# Patient Record
Sex: Female | Born: 1965
Health system: Southern US, Community
[De-identification: ages and names within clinical notes are randomized; demographics above are authoritative.]

## PROBLEM LIST (undated history)

## (undated) DIAGNOSIS — Z973 Presence of spectacles and contact lenses: Secondary | ICD-10-CM

## (undated) DIAGNOSIS — D500 Iron deficiency anemia secondary to blood loss (chronic): Secondary | ICD-10-CM

## (undated) DIAGNOSIS — F419 Anxiety disorder, unspecified: Secondary | ICD-10-CM

## (undated) DIAGNOSIS — N920 Excessive and frequent menstruation with regular cycle: Secondary | ICD-10-CM

## (undated) DIAGNOSIS — K635 Polyp of colon: Secondary | ICD-10-CM

## (undated) DIAGNOSIS — J302 Other seasonal allergic rhinitis: Secondary | ICD-10-CM

## (undated) HISTORY — PX: COLONOSCOPY: SHX174

## (undated) HISTORY — DX: Iron deficiency anemia secondary to blood loss (chronic): D50.0

## (undated) HISTORY — DX: Polyp of colon: K63.5

## (undated) HISTORY — DX: Excessive and frequent menstruation with regular cycle: N92.0

---

## 2001-06-16 ENCOUNTER — Other Ambulatory Visit: Admission: RE | Admit: 2001-06-16 | Discharge: 2001-06-16 | Payer: Self-pay | Admitting: Obstetrics and Gynecology

## 2001-06-22 ENCOUNTER — Encounter: Admission: RE | Admit: 2001-06-22 | Discharge: 2001-06-22 | Payer: Self-pay | Admitting: Obstetrics and Gynecology

## 2001-06-22 ENCOUNTER — Encounter: Payer: Self-pay | Admitting: Obstetrics and Gynecology

## 2005-01-20 ENCOUNTER — Emergency Department (HOSPITAL_COMMUNITY): Admission: EM | Admit: 2005-01-20 | Discharge: 2005-01-20 | Payer: Self-pay | Admitting: Emergency Medicine

## 2006-01-07 ENCOUNTER — Ambulatory Visit: Payer: Self-pay | Admitting: Internal Medicine

## 2007-07-14 HISTORY — PX: BREAST BIOPSY: SHX20

## 2008-05-30 ENCOUNTER — Ambulatory Visit: Payer: Self-pay | Admitting: Family Medicine

## 2008-05-30 DIAGNOSIS — J019 Acute sinusitis, unspecified: Secondary | ICD-10-CM | POA: Insufficient documentation

## 2008-05-30 DIAGNOSIS — F329 Major depressive disorder, single episode, unspecified: Secondary | ICD-10-CM | POA: Insufficient documentation

## 2010-11-12 ENCOUNTER — Other Ambulatory Visit: Payer: Self-pay | Admitting: Obstetrics and Gynecology

## 2011-11-11 LAB — HM PAP SMEAR

## 2012-05-31 ENCOUNTER — Encounter: Payer: Self-pay | Admitting: Family Medicine

## 2012-05-31 ENCOUNTER — Ambulatory Visit (INDEPENDENT_AMBULATORY_CARE_PROVIDER_SITE_OTHER): Payer: BC Managed Care – PPO | Admitting: Family Medicine

## 2012-05-31 VITALS — BP 102/70 | HR 96 | Temp 98.4°F | Ht 66.0 in | Wt 140.0 lb

## 2012-05-31 DIAGNOSIS — F32A Depression, unspecified: Secondary | ICD-10-CM

## 2012-05-31 DIAGNOSIS — F329 Major depressive disorder, single episode, unspecified: Secondary | ICD-10-CM

## 2012-05-31 DIAGNOSIS — Z23 Encounter for immunization: Secondary | ICD-10-CM

## 2012-05-31 DIAGNOSIS — J309 Allergic rhinitis, unspecified: Secondary | ICD-10-CM

## 2012-05-31 DIAGNOSIS — Z7189 Other specified counseling: Secondary | ICD-10-CM

## 2012-05-31 DIAGNOSIS — Z7689 Persons encountering health services in other specified circumstances: Secondary | ICD-10-CM

## 2012-05-31 DIAGNOSIS — T148XXA Other injury of unspecified body region, initial encounter: Secondary | ICD-10-CM

## 2012-05-31 MED ORDER — FLUTICASONE PROPIONATE 50 MCG/ACT NA SUSP: 2.0000 | Freq: Every day | NASAL | Status: DC

## 2012-05-31 NOTE — Patient Instructions (Addendum)
-  We have ordered labs or studies at this visit. It can take up to 1-2 weeks for results and processing. We will contact you with instructions IF your results are abnormal. Normal results will be released to your [facility]. If you have not heard from [name] or can not find your results in [facility] in 2 weeks please contact our office.  -PLEASE SIGN UP FOR MYCHART TODAY   We recommend the following healthy lifestyle measures: - eat a healthy diet consisting of lots of vegetables, fruits, beans, nuts, seeds, healthy meats such as white chicken and fish and whole grains.  - avoid fried foods, fast food, processed foods, sodas, red meet and other fattening foods.  - get a least 150 minutes of aerobic exercise per week.   DEPRESSION: Can try to taper off of Celexa over 3- 4 weeks very slowly. Let me know if this does not go well and you need to restart medication.  FOR ALLERGIES: -try flonase 2 sprays for a few weeks then 1 spray each nostril -can also try over the counter zyrtec or other once daily  HEMATOMA: -iced 15 minutes twice daily -follow up if not resolving over next several weeks  Follow up in:  -3 months

## 2012-05-31 NOTE — Progress Notes (Signed)
Chief Complaint  Patient presents with  . Establish Care    HPI: Sheryl Gonzalez is here to establish care. Works in Programme researcher, broadcasting/film/video for [employer]. Plays softball in some seasons, does not get much exercise in winter. Last visit here in 2009. She has been relatively healthy.   Has the following chronic problems and concerns today:  Anxiety and Depression: -has been on celexa for several years -taking 20 mg daily -stable in terms of mood and doing well  L leg swelling: -got hit in shin with soft ball 3 weeks ago -bruised and has swelling there -weightbearing is fine   Other Providers: -Dr. Donovan Kail, last physical May 2013, mammogram, lab work (cholesterol was good) -[facility]: for vision -Dr. Domingo Dimes: dentist  Vaccines: refuses flu vaccine, can't remember last tdap - she wants to get this today.  ROS: See pertinent positives and negatives per HPI.  No past medical history on file.  Family History  Problem Relation Age of Onset  . Arthritis Other   . Uterine cancer      parent  . Kidney disease Other   . Diabetes Other     History   Social History  . Marital Status: Married    Spouse Name: N/A    Number of Children: N/A  . Years of Education: N/A   Social History Main Topics  . Smoking status: Never Smoker   . Smokeless tobacco: None  . Alcohol Use: No  . Drug Use: None  . Sexually Active: None   Other Topics Concern  . None   Social History Narrative  . None    Current outpatient prescriptions:Cholecalciferol (VITAMIN D-3) 1000 UNITS CAPS, Take by mouth daily., Disp: , Rfl: ;  citalopram (CELEXA) 40 MG tablet, Take 20 mg by mouth daily. , Disp: , Rfl: ;  cyanocobalamin 500 MCG tablet, Take 500 mcg by mouth daily., Disp: , Rfl: ;  Multiple Vitamins-Minerals (ADULT GUMMY) CHEW, Chew 2 tablets by mouth daily., Disp: , Rfl:  Multiple Vitamins-Minerals (QC WOMENS DAILY MULTIVITAMIN PO), Take by mouth daily., Disp: , Rfl: ;  OVER THE COUNTER  MEDICATION, Hair, Skin and Nails, Disp: , Rfl: ;  pyridoxine (B-6) 200 MG tablet, Take 200 mg by mouth daily., Disp: , Rfl: ;  vitamin E 400 UNIT capsule, Take 400 Units by mouth daily., Disp: , Rfl: ;  fluticasone (FLONASE) 50 MCG/ACT nasal spray, Place 2 sprays into the nose daily., Disp: 16 g, Rfl: 2  EXAM:  Filed Vitals:   05/31/12 0925  BP: 102/70  Pulse: 96  Temp: 98.4 F (36.9 C)    Body mass index is 22.60 kg/(m^2).  GENERAL: vitals reviewed and listed above, alert, oriented, appears well hydrated and in no acute distress  HEENT: atraumatic, conjunttiva clear, no obvious abnormalities on inspection of external nose and ears. Pale boggy nasal turbinates, PND  NECK: no obvious masses on inspection  LUNGS: clear to auscultation bilaterally, no wheezes, rales or rhonchi, good air movement  CV: HRRR, no peripheral edema  MS: moves all extremities without noticeable abnormality, small soft tissue swelling over L ant shin, no bony TTP  PSYCH: pleasant and cooperative, no obvious depression or anxiety  ASSESSMENT AND PLAN:  Discussed the following assessment and plan:  1. Encounter to establish care    2. Depression    3. Allergic rhinitis  fluticasone (FLONASE) 50 MCG/ACT nasal spray  4. Hematoma     -sounds like pt had fasting labs earlier this year and  fine per pt, so will not repeat today -advised trial of weaning off celexa   -We reviewed the PMH, PSH, FH, SH, Meds and Allergies. -We provided refills for any medications we will prescribe as needed. -We addressed current concerns per orders and patient instructions. -We have asked for records for pertinent exams, studies, vaccines and notes from previous providers. -We have advised patient to follow up per instructions below. -Influenza vaccine given today  -Patient advised to return or notify a doctor immediately if symptoms worsen or persist or new concerns arise.  Patient Instructions  -We have ordered labs  or studies at this visit. It can take up to 1-2 weeks for results and processing. We will contact you with instructions IF your results are abnormal. Normal results will be released to your [facility]. If you have not heard from [name] or can not find your results in [facility] in 2 weeks please contact our office.  -PLEASE SIGN UP FOR MYCHART TODAY   We recommend the following healthy lifestyle measures: - eat a healthy diet consisting of lots of vegetables, fruits, beans, nuts, seeds, healthy meats such as white chicken and fish and whole grains.  - avoid fried foods, fast food, processed foods, sodas, red meet and other fattening foods.  - get a least 150 minutes of aerobic exercise per week.   DEPRESSION: Can try to taper off of Celexa over 3- 4 weeks very slowly. Let me know if this does not go well and you need to restart medication.  FOR ALLERGIES: -try flonase 2 sprays for a few weeks then 1 spray each nostril -can also try over the counter zyrtec or other once daily  HEMATOMA: -iced 15 minutes twice daily -follow up if not resolving over next several weeks  Follow up in:  -3 months      KIM, HANNAH R.

## 2012-05-31 NOTE — Addendum Note (Signed)
Addended by: Azucena Freed on: 05/31/2012 10:18 AM   Modules accepted: Orders

## 2012-06-02 ENCOUNTER — Telehealth: Payer: Self-pay | Admitting: Family Medicine

## 2012-06-02 NOTE — Telephone Encounter (Signed)
Pt would like Dr Selena Batten to write her scripts for her vitamins:   Vitamin E, B-12, Vitamin D. Multi Vitamin w/ Minerals. So that she can use her flex plan money to buy them with. Please fax her scripts to:  [phone]. / Attn: Kimberely

## 2012-06-02 NOTE — Telephone Encounter (Signed)
Pt called to check on status of getting scripts written for vitamins. Pls call.

## 2012-06-02 NOTE — Telephone Encounter (Signed)
Please let her know: -Rx written for vitamin D and calcium per recs for her age - she can pick this up if she wishes -folic acid is recommended in women of childbearing age in case they become pregnant -I am sorry, but studies do not support use of other vitamins unless specific deficiencies or illness and they may even increase mortality - for these reasons I can not recommend or prescribe these  Thanks.

## 2012-06-02 NOTE — Telephone Encounter (Signed)
Called and left a message on pt's personal vm that message was received and dr. Selena Batten is currently seeing patients. Per Dr. Selena Batten advised that pt may need an office visit to have these rx filled out.

## 2012-06-02 NOTE — Telephone Encounter (Signed)
pls advise

## 2012-06-03 NOTE — Telephone Encounter (Signed)
Called and spoke with pt and pt is aware of Dr. Elmyra Ricks recommendations.  Pt request to have script faxed to her at work.  Verified fax number.  Rx faxed.

## 2012-07-25 ENCOUNTER — Other Ambulatory Visit: Payer: Self-pay | Admitting: Obstetrics and Gynecology

## 2012-07-25 DIAGNOSIS — N6452 Nipple discharge: Secondary | ICD-10-CM

## 2012-08-03 ENCOUNTER — Other Ambulatory Visit: Payer: Self-pay | Admitting: Obstetrics and Gynecology

## 2012-08-03 DIAGNOSIS — N6452 Nipple discharge: Secondary | ICD-10-CM

## 2012-08-04 ENCOUNTER — Ambulatory Visit
Admission: RE | Admit: 2012-08-04 | Discharge: 2012-08-04 | Disposition: A | Discharge status: Home / Self Care | Payer: BC Managed Care – PPO | Source: Ambulatory Visit | Attending: Obstetrics and Gynecology | Admitting: Obstetrics and Gynecology

## 2012-08-04 ENCOUNTER — Other Ambulatory Visit: Payer: Self-pay | Admitting: Obstetrics and Gynecology

## 2012-08-04 DIAGNOSIS — N6452 Nipple discharge: Secondary | ICD-10-CM

## 2012-08-22 ENCOUNTER — Encounter (INDEPENDENT_AMBULATORY_CARE_PROVIDER_SITE_OTHER): Payer: Self-pay | Admitting: General Surgery

## 2012-08-22 ENCOUNTER — Ambulatory Visit (INDEPENDENT_AMBULATORY_CARE_PROVIDER_SITE_OTHER): Payer: BC Managed Care – PPO | Admitting: General Surgery

## 2012-08-22 ENCOUNTER — Other Ambulatory Visit (INDEPENDENT_AMBULATORY_CARE_PROVIDER_SITE_OTHER): Payer: Self-pay

## 2012-08-22 VITALS — BP 110/62 | HR 73 | Temp 98.2°F | Resp 18 | Ht 65.5 in | Wt 142.6 lb

## 2012-08-22 DIAGNOSIS — N6459 Other signs and symptoms in breast: Secondary | ICD-10-CM

## 2012-08-22 DIAGNOSIS — N6452 Nipple discharge: Secondary | ICD-10-CM | POA: Insufficient documentation

## 2012-08-22 NOTE — Progress Notes (Signed)
Chief Complaint  Patient presents with  . New Evaluation    eval L br palpalloma    HISTORY: Pt seen in consultation at the request of Dr. Tenny Gonzalez of OB/GYN for bloody nipple discharge.  She under went mammogram, ultrasound, and ductogram which defined 2 areas of concern.  She had bloody nipple discharge back in 2010 as well and underwent core needle biopsy with at [facility] with papilloma found.  The discharge resolved for the most part until recently.  On the current ductogram, the duct that was cannulated led to the prior biopsy site, but did not define the defect well.  There was contrast extravasation.  She had a second area of debris/intraductal mass found at 9 o'clock.  She denies breast pain or skin contour changes.  She had a normal mammogram last summer.  These changes are seen on ultrasound/ductgram only.  She denies nipple retraction.  She has not had daily discharge.    History reviewed. No pertinent past medical history.  Past Surgical History  Procedure Laterality Date  . Breast biopsy  2009    Current Outpatient Prescriptions  Medication Sig Dispense Refill  . Cholecalciferol (VITAMIN D-3) 1000 UNITS CAPS Take by mouth daily.      . citalopram (CELEXA) 10 MG tablet Take 10 mg by mouth at bedtime.      . cyanocobalamin 500 MCG tablet Take 500 mcg by mouth daily.      . fluticasone (FLONASE) 50 MCG/ACT nasal spray Place 2 sprays into the nose daily.  16 g  2  . Multiple Vitamins-Minerals (ADULT GUMMY) CHEW Chew 2 tablets by mouth daily.      [name] OVER THE COUNTER MEDICATION Hair, Skin and Nails      . vitamin E 400 UNIT capsule Take 400 Units by mouth daily.      [name] pyridoxine (B-6) 200 MG tablet Take 200 mg by mouth daily.       No current facility-administered medications for this visit.     No Known Allergies   Family History  Problem Relation Age of Onset  . Arthritis Other   . Uterine cancer      parent  . Kidney disease Other   . Diabetes Other   . Cancer Mother    ? Cervical/Ovarian     History   Social History  . Marital Status: Single    Spouse Name: N/A    Number of Children: N/A  . Years of Education: N/A   Social History Main Topics  . Smoking status: Never Smoker   . Smokeless tobacco: Never Used  . Alcohol Use: No  . Drug Use: No  . Sexually Active: None    REVIEW OF SYSTEMS - PERTINENT POSITIVES ONLY: 12 point review of systems negative other than HPI and PMH  EXAM: Filed Vitals:   08/22/12 1044  BP: 110/62  Pulse: 73  Temp: 98.2 F (36.8 C)  Resp: 18    Gen:  No acute distress.  Well nourished and well groomed.   Neurological: Alert and oriented to person, place, and time. Coordination normal.  Head: Normocephalic and atraumatic.  Eyes: Conjunctivae are normal. Pupils are equal, round, and reactive to light. No scleral icterus.  Neck: Normal range of motion. Neck supple. No tracheal deviation or thyromegaly present.  Cardiovascular: Normal rate, regular rhythm, normal heart sounds and intact distal pulses.  Exam reveals no gallop and no friction rub.  No murmur heard. Respiratory: Effort normal.  No respiratory distress. No chest  wall tenderness. Breath sounds normal.  No wheezes, rales or rhonchi.  GI: Soft. Bowel sounds are normal. The abdomen is soft and nontender.  There is no rebound and no guarding.  Musculoskeletal: Normal range of motion. Extremities are nontender.  Lymphadenopathy: No cervical, preauricular, postauricular or axillary adenopathy is present Skin: Skin is warm and dry. No rash noted. No diaphoresis. No erythema. No pallor. No clubbing, cyanosis, or edema.   Psychiatric: Normal mood and affect. Behavior is normal. Judgment and thought content normal.    LABORATORY RESULTS: Available labs are reviewed    RADIOLOGY RESULTS: See E-Chart or I-Site for most recent results.  Images and reports are reviewed.    ASSESSMENT AND PLAN: Nipple discharge, bloody Patient has bloody nipple discharge.  MRI has been recommended because of multiple issues seen on her ductogram, ultrasound, and mammogram.  This was discussed with Dr. Chilton Gonzalez at the breast center. If the MRI is concerning at the 9:00 location, she may be able to get an MR guided biopsy of the area. She also may need the clip needle localized at the 3:00 location. Once the MRI has been performed, we will define these issues better and schedule surgery. At the very least, she needs a major duct excision of the left breast because of the nipple discharge and concern for papilloma versus malignancy.  Risks of surgery were discussed including bleeding and infection.  The patient was advised that this is an outpatient operation.  Discussed post op care and restrictions.       Maudry Diego MD Surgical Oncology, General and Endocrine Surgery [facility]      Visit Diagnoses: 1. Nipple discharge, bloody     Primary Care Physician: Sheryl Gonzalez., DO

## 2012-08-22 NOTE — Assessment & Plan Note (Signed)
Patient has bloody nipple discharge. MRI has been recommended because of multiple issues seen on her ductogram, ultrasound, and mammogram.  This was discussed with Dr. Chilton Si at the breast center. If the MRI is concerning at the 9:00 location, she may be able to get an MR guided biopsy of the area. She also may need the clip needle localized at the 3:00 location. Once the MRI has been performed, we will define these issues better and schedule surgery. At the very least, she needs a major duct excision of the left breast because of the nipple discharge and concern for papilloma versus malignancy.  Risks of surgery were discussed including bleeding and infection.  The patient was advised that this is an outpatient operation.  Discussed post op care and restrictions.

## 2012-08-22 NOTE — Patient Instructions (Signed)
Will get MRI scheduled.  Will determine final surgical plan after MRI and +/- needle biopsy.

## 2012-08-27 ENCOUNTER — Other Ambulatory Visit: Payer: Self-pay

## 2012-08-31 ENCOUNTER — Ambulatory Visit: Payer: BC Managed Care – PPO | Admitting: Family Medicine

## 2012-09-01 ENCOUNTER — Ambulatory Visit
Admission: RE | Admit: 2012-09-01 | Discharge: 2012-09-01 | Disposition: A | Discharge status: Home / Self Care | Payer: BC Managed Care – PPO | Source: Ambulatory Visit | Attending: General Surgery | Admitting: General Surgery

## 2012-09-01 DIAGNOSIS — N6452 Nipple discharge: Secondary | ICD-10-CM

## 2012-09-21 ENCOUNTER — Telehealth (INDEPENDENT_AMBULATORY_CARE_PROVIDER_SITE_OTHER): Payer: Self-pay | Admitting: General Surgery

## 2012-09-21 NOTE — Telephone Encounter (Signed)
Pt called and stated she has an MRI set up for Monday and wanted to check on the status of the Prior Authorization for it.  Please advise.

## 2012-09-26 ENCOUNTER — Ambulatory Visit
Admission: RE | Admit: 2012-09-26 | Discharge: 2012-09-26 | Disposition: A | Discharge status: Home / Self Care | Payer: BC Managed Care – PPO | Source: Ambulatory Visit | Attending: General Surgery | Admitting: General Surgery

## 2012-09-26 DIAGNOSIS — N6452 Nipple discharge: Secondary | ICD-10-CM

## 2012-09-26 MED ORDER — GADOBENATE DIMEGLUMINE 529 MG/ML IV SOLN
13.0000 mL | Freq: Once | INTRAVENOUS | Status: AC | PRN
  Administered 2012-09-26: 13 mL via INTRAVENOUS

## 2012-09-30 ENCOUNTER — Other Ambulatory Visit (INDEPENDENT_AMBULATORY_CARE_PROVIDER_SITE_OTHER): Payer: Self-pay | Admitting: General Surgery

## 2012-09-30 DIAGNOSIS — N6452 Nipple discharge: Secondary | ICD-10-CM

## 2012-10-06 ENCOUNTER — Encounter (HOSPITAL_BASED_OUTPATIENT_CLINIC_OR_DEPARTMENT_OTHER): Payer: Self-pay | Admitting: *Deleted

## 2012-10-06 NOTE — Progress Notes (Signed)
No labs needed

## 2012-10-12 ENCOUNTER — Ambulatory Visit (HOSPITAL_BASED_OUTPATIENT_CLINIC_OR_DEPARTMENT_OTHER)
Admission: RE | Admit: 2012-10-12 | Discharge: 2012-10-12 | Disposition: A | Discharge status: Home / Self Care | Payer: BC Managed Care – PPO | Source: Ambulatory Visit | Attending: General Surgery | Admitting: General Surgery

## 2012-10-12 ENCOUNTER — Ambulatory Visit
Admission: RE | Admit: 2012-10-12 | Discharge: 2012-10-12 | Disposition: A | Discharge status: Home / Self Care | Payer: BC Managed Care – PPO | Source: Ambulatory Visit | Attending: General Surgery | Admitting: General Surgery

## 2012-10-12 ENCOUNTER — Encounter (HOSPITAL_BASED_OUTPATIENT_CLINIC_OR_DEPARTMENT_OTHER): Payer: Self-pay | Admitting: Anesthesiology

## 2012-10-12 ENCOUNTER — Encounter (HOSPITAL_BASED_OUTPATIENT_CLINIC_OR_DEPARTMENT_OTHER)
Admission: RE | Disposition: A | Discharge status: Home / Self Care | Payer: Self-pay | Source: Ambulatory Visit | Attending: General Surgery

## 2012-10-12 ENCOUNTER — Ambulatory Visit (HOSPITAL_BASED_OUTPATIENT_CLINIC_OR_DEPARTMENT_OTHER): Payer: BC Managed Care – PPO | Admitting: Anesthesiology

## 2012-10-12 DIAGNOSIS — N6029 Fibroadenosis of unspecified breast: Secondary | ICD-10-CM | POA: Insufficient documentation

## 2012-10-12 DIAGNOSIS — D249 Benign neoplasm of unspecified breast: Secondary | ICD-10-CM | POA: Insufficient documentation

## 2012-10-12 DIAGNOSIS — N6452 Nipple discharge: Secondary | ICD-10-CM

## 2012-10-12 DIAGNOSIS — Z79899 Other long term (current) drug therapy: Secondary | ICD-10-CM | POA: Insufficient documentation

## 2012-10-12 HISTORY — DX: Other seasonal allergic rhinitis: J30.2

## 2012-10-12 HISTORY — DX: Anxiety disorder, unspecified: F41.9

## 2012-10-12 HISTORY — DX: Presence of spectacles and contact lenses: Z97.3

## 2012-10-12 HISTORY — PX: BREAST DUCTAL SYSTEM EXCISION: SHX5242

## 2012-10-12 HISTORY — PX: BREAST BIOPSY: SHX20

## 2012-10-12 SURGERY — BREAST BIOPSY WITH NEEDLE LOCALIZATION
Anesthesia: General | Site: Breast | Laterality: Left | Wound class: Clean

## 2012-10-12 MED ORDER — LIDOCAINE HCL (CARDIAC) 20 MG/ML IV SOLN
INTRAVENOUS | Status: DC | PRN
  Administered 2012-10-12: 40 mg via INTRAVENOUS

## 2012-10-12 MED ORDER — OXYCODONE-ACETAMINOPHEN 5-325 MG PO TABS: 1.0000 | ORAL_TABLET | ORAL | Status: DC | PRN

## 2012-10-12 MED ORDER — FENTANYL CITRATE 0.05 MG/ML IJ SOLN
INTRAMUSCULAR | Status: DC | PRN
  Administered 2012-10-12: 100 ug via INTRAVENOUS
  Administered 2012-10-12 (×2): 25 ug via INTRAVENOUS

## 2012-10-12 MED ORDER — LACTATED RINGERS IV SOLN
INTRAVENOUS | Status: DC
  Administered 2012-10-12 (×2): via INTRAVENOUS

## 2012-10-12 MED ORDER — BUPIVACAINE-EPINEPHRINE 0.25% -1:200000 IJ SOLN
INTRAMUSCULAR | Status: DC | PRN
  Administered 2012-10-12: 10 mL

## 2012-10-12 MED ORDER — CEFAZOLIN SODIUM-DEXTROSE 2-3 GM-% IV SOLR
2.0000 g | INTRAVENOUS | Status: AC
  Administered 2012-10-12: 2 g via INTRAVENOUS

## 2012-10-12 MED ORDER — EPHEDRINE SULFATE 50 MG/ML IJ SOLN
INTRAMUSCULAR | Status: DC | PRN
  Administered 2012-10-12: 10 mg via INTRAVENOUS

## 2012-10-12 MED ORDER — ACETAMINOPHEN 325 MG PO TABS: 650.0000 mg | ORAL_TABLET | ORAL | Status: DC | PRN

## 2012-10-12 MED ORDER — SODIUM CHLORIDE 0.9 % IJ SOLN: 3.0000 mL | Freq: Two times a day (BID) | INTRAMUSCULAR | Status: DC

## 2012-10-12 MED ORDER — MIDAZOLAM HCL 5 MG/5ML IJ SOLN
INTRAMUSCULAR | Status: DC | PRN
  Administered 2012-10-12: 2 mg via INTRAVENOUS

## 2012-10-12 MED ORDER — ONDANSETRON HCL 4 MG/2ML IJ SOLN
INTRAMUSCULAR | Status: DC | PRN
  Administered 2012-10-12: 4 mg via INTRAVENOUS

## 2012-10-12 MED ORDER — MIDAZOLAM HCL 2 MG/2ML IJ SOLN: 1.0000 mg | INTRAMUSCULAR | Status: DC | PRN

## 2012-10-12 MED ORDER — PROPOFOL 10 MG/ML IV BOLUS
INTRAVENOUS | Status: DC | PRN
  Administered 2012-10-12: 130 mg via INTRAVENOUS

## 2012-10-12 MED ORDER — SODIUM CHLORIDE 0.9 % IV SOLN: 250.0000 mL | INTRAVENOUS | Status: DC | PRN

## 2012-10-12 MED ORDER — OXYCODONE HCL 5 MG PO TABS: 5.0000 mg | ORAL_TABLET | ORAL | Status: DC | PRN

## 2012-10-12 MED ORDER — ACETAMINOPHEN 10 MG/ML IV SOLN: 1000.0000 mg | Freq: Once | INTRAVENOUS | Status: DC | PRN

## 2012-10-12 MED ORDER — ONDANSETRON HCL 4 MG/2ML IJ SOLN: 4.0000 mg | Freq: Once | INTRAMUSCULAR | Status: DC | PRN

## 2012-10-12 MED ORDER — SODIUM CHLORIDE 0.9 % IJ SOLN: 3.0000 mL | INTRAMUSCULAR | Status: DC | PRN

## 2012-10-12 MED ORDER — FENTANYL CITRATE 0.05 MG/ML IJ SOLN: 50.0000 ug | INTRAMUSCULAR | Status: DC | PRN

## 2012-10-12 MED ORDER — DEXAMETHASONE SODIUM PHOSPHATE 4 MG/ML IJ SOLN
INTRAMUSCULAR | Status: DC | PRN
  Administered 2012-10-12: 5 mg via INTRAVENOUS

## 2012-10-12 MED ORDER — HYDROMORPHONE HCL PF 1 MG/ML IJ SOLN: 0.2500 mg | INTRAMUSCULAR | Status: DC | PRN

## 2012-10-12 MED ORDER — ONDANSETRON HCL 4 MG/2ML IJ SOLN: 4.0000 mg | Freq: Four times a day (QID) | INTRAMUSCULAR | Status: DC | PRN

## 2012-10-12 MED ORDER — ACETAMINOPHEN 650 MG RE SUPP: 650.0000 mg | RECTAL | Status: DC | PRN

## 2012-10-12 SURGICAL SUPPLY — 49 items
BINDER BREAST LRG (GAUZE/BANDAGES/DRESSINGS) ×2 IMPLANT
BLADE HEX COATED 2.75 (ELECTRODE) ×2 IMPLANT
BLADE SURG 10 STRL SS (BLADE) ×2 IMPLANT
BLADE SURG 15 STRL LF DISP TIS (BLADE) ×1 IMPLANT
BLADE SURG 15 STRL SS (BLADE) ×1
CANISTER SUCTION 1200CC (MISCELLANEOUS) ×2 IMPLANT
CHLORAPREP W/TINT 26ML (MISCELLANEOUS) ×2 IMPLANT
CLIP TI WIDE RED SMALL 6 (CLIP) IMPLANT
CLOTH BEACON ORANGE TIMEOUT ST (SAFETY) ×2 IMPLANT
COVER MAYO STAND STRL (DRAPES) ×2 IMPLANT
COVER TABLE BACK 60X90 (DRAPES) ×2 IMPLANT
DECANTER SPIKE VIAL GLASS SM (MISCELLANEOUS) IMPLANT
DEVICE DUBIN W/COMP PLATE 8390 (MISCELLANEOUS) IMPLANT
DRAPE PED LAPAROTOMY (DRAPES) ×2 IMPLANT
DRAPE UTILITY XL STRL (DRAPES) ×2 IMPLANT
DRSG PAD ABDOMINAL 8X10 ST (GAUZE/BANDAGES/DRESSINGS) ×2 IMPLANT
ELECT REM PT RETURN 9FT ADLT (ELECTROSURGICAL) ×2
ELECTRODE REM PT RTRN 9FT ADLT (ELECTROSURGICAL) ×1 IMPLANT
GAUZE SPONGE 4X4 12PLY STRL LF (GAUZE/BANDAGES/DRESSINGS) ×2 IMPLANT
GLOVE BIO SURGEON STRL SZ 6 (GLOVE) ×2 IMPLANT
GLOVE BIO SURGEON STRL SZ 6.5 (GLOVE) ×2 IMPLANT
GLOVE BIOGEL PI IND STRL 6.5 (GLOVE) ×1 IMPLANT
GLOVE BIOGEL PI IND STRL 7.0 (GLOVE) ×1 IMPLANT
GLOVE BIOGEL PI INDICATOR 6.5 (GLOVE) ×1
GLOVE BIOGEL PI INDICATOR 7.0 (GLOVE) ×1
GOWN PREVENTION PLUS XLARGE (GOWN DISPOSABLE) ×2 IMPLANT
GOWN PREVENTION PLUS XXLARGE (GOWN DISPOSABLE) ×2 IMPLANT
KIT MARKER MARGIN INK (KITS) IMPLANT
NEEDLE HYPO 25X1 1.5 SAFETY (NEEDLE) ×2 IMPLANT
NS IRRIG 1000ML POUR BTL (IV SOLUTION) ×2 IMPLANT
PACK BASIN DAY SURGERY FS (CUSTOM PROCEDURE TRAY) ×2 IMPLANT
PENCIL BUTTON HOLSTER BLD 10FT (ELECTRODE) ×2 IMPLANT
SLEEVE SCD COMPRESS KNEE MED (MISCELLANEOUS) ×2 IMPLANT
SPONGE GAUZE 4X4 16PLY UNSTER (WOUND CARE) ×4 IMPLANT
SPONGE LAP 18X18 X RAY DECT (DISPOSABLE) ×2 IMPLANT
STAPLER VISISTAT 35W (STAPLE) IMPLANT
STRIP CLOSURE SKIN 1/2X4 (GAUZE/BANDAGES/DRESSINGS) ×2 IMPLANT
SUT MON AB 4-0 PC3 18 (SUTURE) ×2 IMPLANT
SUT SILK 2 0 SH (SUTURE) ×2 IMPLANT
SUT VIC AB 3-0 54X BRD REEL (SUTURE) IMPLANT
SUT VIC AB 3-0 BRD 54 (SUTURE)
SUT VIC AB 3-0 SH 27 (SUTURE) ×1
SUT VIC AB 3-0 SH 27X BRD (SUTURE) ×1 IMPLANT
SYR BULB 3OZ (MISCELLANEOUS) ×2 IMPLANT
SYR CONTROL 10ML LL (SYRINGE) ×2 IMPLANT
TOWEL OR 17X24 6PK STRL BLUE (TOWEL DISPOSABLE) ×2 IMPLANT
TOWEL OR NON WOVEN STRL DISP B (DISPOSABLE) ×2 IMPLANT
TUBE CONNECTING 20X1/4 (TUBING) ×2 IMPLANT
YANKAUER SUCT BULB TIP NO VENT (SUCTIONS) ×2 IMPLANT

## 2012-10-12 NOTE — Anesthesia Procedure Notes (Signed)
Procedure Name: LMA Insertion Date/Time: 10/12/2012 1:45 PM Performed by: Burna Cash Pre-anesthesia Checklist: Patient identified, Emergency Drugs available, Suction available and Patient being monitored Patient Re-evaluated:Patient Re-evaluated prior to inductionOxygen Delivery Method: Circle System Utilized Preoxygenation: Pre-oxygenation with 100% oxygen Intubation Type: IV induction Ventilation: Mask ventilation without difficulty LMA: LMA inserted LMA Size: 4.0 Number of attempts: 1 Airway Equipment and Method: bite block Placement Confirmation: positive ETCO2 Tube secured with: Tape Dental Injury: Teeth and Oropharynx as per pre-operative assessment

## 2012-10-12 NOTE — Op Note (Signed)
Left Breast needle localized lumpectomy with Major Duct excision  Indications: This patient presents with history of left bloody nipple discharge and residual lesion after percutaneous biopsy  Pre-operative Diagnosis: Left bloody nipple discharge, retained papilloma and clip.  Post-operative Diagnosis: Same  Surgeon: Almond Lint   Assistants: none  Anesthesia: General LMA anesthesia and Local anesthesia 0.5% bupivacaine, with epinephrine  ASA Class: 2  Procedure Details  The patient was seen in the Holding Room. The risks, benefits, complications, treatment options, and expected outcomes were discussed with the patient. The possibilities of reaction to medication, pulmonary aspiration, bleeding, infection, the need for additional procedures, failure to diagnose a condition, and creating a complication requiring transfusion or operation were discussed with the patient. The patient concurred with the proposed plan, giving informed consent.  The site of surgery properly noted/marked. The patient was taken to Operating Room # 5, identified as Sheryl Gonzalez and the procedure verified as left needle localized Breast Lumpectomy and major duct excision. A Time Out was held and the above information confirmed.  After induction of anesthesia, the left breast and chest were prepped and draped in standard fashion. The lacrimal duct probe was introduced into the nipple at the site of the nipple discharge.  The lumpectomy was performed by creating an circumareolar incision over the lower outer quadrant of the nipple.  The previously placed guidewire was adjacent.  Dissection was carried down around the lacrimal duct probe and incorporated the wire.  Orientation sutures were placed.  [name]  Specimen radiography confirmed inclusion of the mammographic lesion.  Hemostasis was achieved with cautery.  The wound was irrigated and closed with a 3-0 Vicryl deep dermal interrupted and a 4-0 Monocryl subcuticular  closure in layers.    Sterile dressings were applied. At the end of the operation, all sponge, instrument, and needle counts were correct.  Findings: Very dense breast tissue.  Estimated Blood Loss:  Minimal         Drains: none         Specimens: left breast lumpectomy with major duct excision                Complications:  None; patient tolerated the procedure well.         Disposition: PACU - hemodynamically stable.         Condition: stable

## 2012-10-12 NOTE — Anesthesia Preprocedure Evaluation (Signed)
Anesthesia Evaluation  Patient identified by MRN, date of birth, ID band Patient awake    Reviewed: Allergy & Precautions, H&P , NPO status , Patient's Chart, lab work & pertinent test results  Airway Mallampati: II TM Distance: >3 FB Neck ROM: Full    Dental  (+) Teeth Intact and Dental Advisory Given   Pulmonary  breath sounds clear to auscultation        Cardiovascular Rhythm:Regular Rate:Normal     Neuro/Psych    GI/Hepatic   Endo/Other    Renal/GU      Musculoskeletal   Abdominal   Peds  Hematology   Anesthesia Other Findings   Reproductive/Obstetrics                           Anesthesia Physical Anesthesia Plan  ASA: I  Anesthesia Plan: General   Post-op Pain Management:    Induction: Intravenous  Airway Management Planned: LMA  Additional Equipment:   Intra-op Plan:   Post-operative Plan:   Informed Consent: I have reviewed the patients History and Physical, chart, labs and discussed the procedure including the risks, benefits and alternatives for the proposed anesthesia with the patient or authorized representative who has indicated his/her understanding and acceptance.   Dental advisory given  Plan Discussed with: CRNA, Anesthesiologist and Surgeon  Anesthesia Plan Comments: (L. Breast Ca Seasonal rhinitis  Plan GA with LMA  Kipp Brood, MD)        Anesthesia Quick Evaluation

## 2012-10-12 NOTE — H&P (Signed)
Chief Complaint   Patient presents with   .  New Evaluation     eval L br palpalloma   HISTORY:  Pt seen in consultation at the request of Dr. Tenny Craw of OB/GYN for bloody nipple discharge. She under went mammogram, ultrasound, and ductogram which defined 2 areas of concern. She had bloody nipple discharge back in 2010 as well and underwent core needle biopsy with at [facility] with papilloma found. The discharge resolved for the most part until recently. On the current ductogram, the duct that was cannulated led to the prior biopsy site, but did not define the defect well. There was contrast extravasation. She had a second area of debris/intraductal mass found at 9 o'clock. She denies breast pain or skin contour changes. She had a normal mammogram last summer. These changes are seen on ultrasound/ductgram only. She denies nipple retraction. She has not had daily discharge. She had MRI which demonstrated prior clip near papilloma.     History reviewed. No pertinent past medical history.   Past Surgical History   Procedure  Laterality  Date   .  Breast biopsy   2009    Current Outpatient Prescriptions   Medication  Sig  Dispense  Refill   .  Cholecalciferol (VITAMIN D-3) 1000 UNITS CAPS  Take by mouth daily.     .  citalopram (CELEXA) 10 MG tablet  Take 10 mg by mouth at bedtime.     .  cyanocobalamin 500 MCG tablet  Take 500 mcg by mouth daily.     .  fluticasone (FLONASE) 50 MCG/ACT nasal spray  Place 2 sprays into the nose daily.  16 g  2   .  Multiple Vitamins-Minerals (ADULT GUMMY) CHEW  Chew 2 tablets by mouth daily.     [name]  OVER THE COUNTER MEDICATION  Hair, Skin and Nails     .  vitamin E 400 UNIT capsule  Take 400 Units by mouth daily.     [name]   pyridoxine (B-6) 200 MG tablet  Take 200 mg by mouth daily.      No current facility-administered medications for this visit.    No Known Allergies  Family History   Problem  Relation  Age of Onset   .  Arthritis  Other    .  Uterine cancer        parent   .  Kidney disease  Other    .  Diabetes  Other    .  Cancer  Mother      ? Cervical/Ovarian    History    Social History   .  Marital Status:  Single     Spouse Name:  N/A     Number of Children:  N/A   .  Years of Education:  N/A    Social History Main Topics   .  Smoking status:  Never Smoker   .  Smokeless tobacco:  Never Used   .  Alcohol Use:  No   .  Drug Use:  No   .  Sexually Active:  None    REVIEW OF SYSTEMS - PERTINENT POSITIVES ONLY:  12 point review of systems negative other than HPI and PMH    EXAM:  Wt Readings from Last 3 Encounters:  10/12/12 143 lb (64.864 kg)  10/12/12 143 lb (64.864 kg)  08/22/12 142 lb 9.6 oz (64.683 kg)   Temp Readings from Last 3 Encounters:  10/12/12 98.7 F (37.1 C) Oral  10/12/12 98.7  F (37.1 C) Oral  08/22/12 98.2 F (36.8 C) Temporal   BP Readings from Last 3 Encounters:  10/12/12 133/81  10/12/12 133/81  08/22/12 110/62   Pulse Readings from Last 3 Encounters:  10/12/12 90  10/12/12 90  08/22/12 73                    Gen: No acute distress. Well nourished and well groomed.  Neurological: Alert and oriented to person, place, and time. Coordination normal.  Head: Normocephalic and atraumatic.  Eyes: Conjunctivae are normal. Pupils are equal, round, and reactive to light. No scleral icterus.  Neck: Normal range of motion. Neck supple. No tracheal deviation or thyromegaly present.  Breast:  Left breast with wire in place.   GI: Soft. Bowel sounds are normal. The abdomen is soft and nontender. There is no rebound and no guarding.  Musculoskeletal: Normal range of motion. Extremities are nontender.  Lymphadenopathy: No cervical, preauricular, postauricular or axillary adenopathy is present Skin: Skin is warm and dry. No rash noted. No diaphoresis. No erythema. No pallor. No clubbing, cyanosis, or edema.  Psychiatric: Normal mood and affect. Behavior is normal. Judgment and thought content normal.    LABORATORY RESULTS:  Available labs are reviewed   RADIOLOGY RESULTS:  See E-Chart or I-Site for most recent results. Images and reports are reviewed.   ASSESSMENT AND PLAN:  Nipple discharge, bloody  Patient has bloody nipple discharge.  MR appears to show only one area of concern.   Biopsy proven residual papilloma at 3 o'clock.   Likely is source of nipple discharge. We will needle loc clip and do major duct excision.   Risks of surgery were discussed including bleeding and infection. The patient was advised that this is an outpatient operation. Discussed post op care and restrictions.   Maudry Diego MD  Surgical Oncology, General and Endocrine Surgery  [facility]   Visit Diagnoses:  1.  Nipple discharge, bloody   Primary Care Physician:  Terressa Koyanagi., DO

## 2012-10-12 NOTE — Transfer of Care (Signed)
Immediate Anesthesia Transfer of Care Note  Patient: Sheryl Gonzalez  Procedure(s) Performed: Procedure(s): LEFT NEEDLE LOCALIZATION EXCISIONAL BREAST BIOPSY (Left) MAJOR DUCTAL EXCISION (Left)  Patient Location: PACU  Anesthesia Type:General  Level of Consciousness: awake, alert  and oriented  Airway & Oxygen Therapy: Patient Spontanous Breathing and Patient connected to face mask oxygen  Post-op Assessment: Report given to PACU RN and Post -op Vital signs reviewed and stable  Post vital signs: Reviewed and stable  Complications: No apparent anesthesia complications

## 2012-10-12 NOTE — Anesthesia Postprocedure Evaluation (Signed)
  Anesthesia Post-op Note  Patient: Sheryl Gonzalez  Procedure(s) Performed: Procedure(s): LEFT NEEDLE LOCALIZATION EXCISIONAL BREAST BIOPSY (Left) MAJOR DUCTAL EXCISION (Left)  Patient Location: PACU  Anesthesia Type:General  Level of Consciousness: awake, alert  and oriented  Airway and Oxygen Therapy: Patient Spontanous Breathing  Post-op Pain: mild  Post-op Assessment: Post-op Vital signs reviewed, Patient's Cardiovascular Status Stable, Respiratory Function Stable, Patent Airway and No signs of Nausea or vomiting  Post-op Vital Signs: Reviewed and stable  Complications: No apparent anesthesia complications

## 2012-10-13 ENCOUNTER — Encounter (HOSPITAL_BASED_OUTPATIENT_CLINIC_OR_DEPARTMENT_OTHER): Payer: Self-pay | Admitting: General Surgery

## 2012-10-17 ENCOUNTER — Telehealth (INDEPENDENT_AMBULATORY_CARE_PROVIDER_SITE_OTHER): Payer: Self-pay

## 2012-10-17 NOTE — Telephone Encounter (Signed)
Pt notified pathology benign.  Will f/u on 4/14 with Dr. Donell Beers.

## 2012-10-24 ENCOUNTER — Encounter (INDEPENDENT_AMBULATORY_CARE_PROVIDER_SITE_OTHER): Payer: Self-pay | Admitting: General Surgery

## 2012-10-24 ENCOUNTER — Ambulatory Visit (INDEPENDENT_AMBULATORY_CARE_PROVIDER_SITE_OTHER): Payer: BC Managed Care – PPO | Admitting: General Surgery

## 2012-10-24 VITALS — BP 112/74 | HR 70 | Temp 98.0°F | Resp 12 | Ht 65.5 in | Wt 146.2 lb

## 2012-10-24 DIAGNOSIS — N6452 Nipple discharge: Secondary | ICD-10-CM

## 2012-10-24 DIAGNOSIS — N6459 Other signs and symptoms in breast: Secondary | ICD-10-CM

## 2012-10-24 NOTE — Assessment & Plan Note (Signed)
Pathology benign.  Get back on yearly mammograms  Follow up in 1 year or as needed.

## 2012-10-24 NOTE — Patient Instructions (Signed)
Get 1 year mammogram.  Follow up in 1 year.

## 2012-10-24 NOTE — Progress Notes (Signed)
HISTORY: Pt is now 2 weeks s/p needle localized lumpectomy/major duct excision.  She took 1 pain pill and 3 advil.  She had no wound problems.  She is back at work.      EXAM: General:  Alert and oriented Incision:  Healing well.  No erythema   PATHOLOGY: Breast, lumpectomy, Left - FIBROCYSTIC CHANGES WITH ADENOSIS AND CALCIFICATIONS. - VASCULAR CALCIFICATIONS. - INTRADUCTAL PAPILLOMA. - THERE IS NO EVIDENCE OF MALIGNANCY. - SEE COMMENT.   ASSESSMENT AND PLAN:   Nipple discharge, bloody Pathology benign.  Get back on yearly mammograms  Follow up in 1 year or as needed.        Maudry Diego, MD Surgical Oncology, General & Endocrine Surgery [facility], Debby Bud, Damita Lack., DO Terressa Koyanagi., DO

## 2013-01-11 ENCOUNTER — Other Ambulatory Visit: Payer: Self-pay | Admitting: Obstetrics and Gynecology

## 2013-03-30 ENCOUNTER — Ambulatory Visit (INDEPENDENT_AMBULATORY_CARE_PROVIDER_SITE_OTHER): Payer: BC Managed Care – PPO | Admitting: Family Medicine

## 2013-03-30 ENCOUNTER — Encounter: Payer: Self-pay | Admitting: Family Medicine

## 2013-03-30 VITALS — BP 110/80 | Temp 98.1°F | Wt 148.0 lb

## 2013-03-30 DIAGNOSIS — R300 Dysuria: Secondary | ICD-10-CM

## 2013-03-30 LAB — POCT URINALYSIS DIPSTICK
Blood, UA: NEGATIVE
Ketones, UA: NEGATIVE
Nitrite, UA: NEGATIVE
Protein, UA: NEGATIVE
pH, UA: 6

## 2013-03-30 LAB — BASIC METABOLIC PANEL
Calcium: 8.7 mg/dL (ref 8.4–10.5)
GFR: 71.5 mL/min (ref >60.00)
Potassium: 3.9 mEq/L (ref 3.5–5.1)
Sodium: 137 mEq/L (ref 135–145)

## 2013-03-30 LAB — HEMOGLOBIN A1C: Hgb A1c MFr Bld: 5.5 % (ref 4.6–6.5)

## 2013-03-30 NOTE — Addendum Note (Signed)
Addended by: Azucena Freed on: 03/30/2013 01:58 PM   Modules accepted: Orders

## 2013-03-30 NOTE — Progress Notes (Signed)
Quick Note:    Released to mychart.

## 2013-03-30 NOTE — Progress Notes (Signed)
Chief Complaint  Patient presents with  . Urinary Frequency    HPI:  [name] has a PMH of anxiety and depression and is here for an acute visit for:  1) Dysuria: -some urinary frequency for some time -2 times per night, 4-5 times per day, drinks a decent amount of water -denies: burning, dysuria, incontinence, pruritis, sees gynecologist for yearly physicals -does drink caffeine - frappes, lots of gatorade, sprite, etc -mother a diabetic and wants to check for this  ROS: See pertinent positives and negatives per HPI.  Past Medical History  Diagnosis Date  . Anxiety   . Depression   . Seasonal allergies   . Wears glasses     Past Surgical History  Procedure Laterality Date  . Breast biopsy  2009    lt  . Colonoscopy    . Breast biopsy Left 10/12/2012    Procedure: LEFT NEEDLE LOCALIZATION EXCISIONAL BREAST BIOPSY;  Surgeon: Almond Lint, MD;  Location: [facility];  Service: General;  Laterality: Left;  . Breast ductal system excision Left 10/12/2012    Procedure: MAJOR DUCTAL EXCISION;  Surgeon: Almond Lint, MD;  Location: [facility];  Service: General;  Laterality: Left;    Family History  Problem Relation Age of Onset  . Arthritis Other   . Uterine cancer      parent  . Kidney disease Other   . Diabetes Other   . Cancer Mother     ? Cervical/Ovarian    History   Social History  . Marital Status: Single    Spouse Name: N/A    Number of Children: N/A  . Years of Education: N/A   Social History Main Topics  . Smoking status: Never Smoker   . Smokeless tobacco: Never Used  . Alcohol Use: No  . Drug Use: No  . Sexual Activity: None   Other Topics Concern  . None   Social History Narrative  . None    Current outpatient prescriptions:Cholecalciferol (VITAMIN D-3) 1000 UNITS CAPS, Take by mouth daily., Disp: , Rfl: ;  citalopram (CELEXA) 10 MG tablet, Take 10 mg by mouth at bedtime., Disp: , Rfl: ;   cyanocobalamin 500 MCG tablet, Take 500 mcg by mouth daily., Disp: , Rfl: ;  fluticasone (FLONASE) 50 MCG/ACT nasal spray, Place 2 sprays into the nose daily., Disp: 16 g, Rfl: 2;  Lactobacillus (PROBIOTIC ACIDOPHILUS PO), Take by mouth., Disp: , Rfl:  Multiple Vitamins-Minerals (ADULT GUMMY) CHEW, Chew 2 tablets by mouth daily., Disp: , Rfl: ;  OVER THE COUNTER MEDICATION, Hair, Skin and Nails, Disp: , Rfl: ;  pyridoxine (B-6) 200 MG tablet, Take 200 mg by mouth daily., Disp: , Rfl: ;  vitamin E 400 UNIT capsule, Take 400 Units by mouth daily., Disp: , Rfl:  oxyCODONE-acetaminophen (ROXICET) 5-325 MG per tablet, Take 1-2 tablets by mouth every 4 (four) hours as needed for pain., Disp: 30 tablet, Rfl: 0  EXAM:  Filed Vitals:   03/30/13 0816  BP: 110/80  Temp: 98.1 F (36.7 C)    Body mass index is 24.25 kg/(m^2).  GENERAL: vitals reviewed and listed above, alert, oriented, appears well hydrated and in no acute distress  HEENT: atraumatic, conjunttiva clear, no obvious abnormalities on inspection of external nose and ears  NECK: no obvious masses on inspection  LUNGS: clear to auscultation bilaterally, no wheezes, rales or rhonchi, good air movement  CV: HRRR, no peripheral edema  MS: moves all extremities without noticeable abnormality  PSYCH: pleasant and cooperative, no obvious depression or anxiety  ASSESSMENT AND PLAN:  Discussed the following assessment and plan:  Dysuria - Plan: Basic metabolic panel, Hemoglobin A1c, Culture, Urine, CANCELED: Lipid panel  -urine dip ok -check labs, but advised decreased caffeine avoiding beverages after dinner, if symptoms persist or bothersome urology referral -Patient advised to return or notify a doctor immediately if symptoms worsen or persist or new concerns arise.  There are no Patient Instructions on file for this visit.   Kriste Basque R.

## 2013-04-01 LAB — URINE CULTURE

## 2013-04-03 NOTE — Progress Notes (Signed)
Quick Note:  Left a detailed message for pt. Released to mychart. ______

## 2013-05-18 ENCOUNTER — Other Ambulatory Visit: Payer: Self-pay

## 2013-11-08 ENCOUNTER — Encounter (INDEPENDENT_AMBULATORY_CARE_PROVIDER_SITE_OTHER): Payer: Self-pay | Admitting: General Surgery

## 2014-04-09 ENCOUNTER — Other Ambulatory Visit: Payer: Self-pay | Admitting: Obstetrics and Gynecology

## 2014-05-24 ENCOUNTER — Ambulatory Visit: Payer: BC Managed Care – PPO | Attending: Gynecologic Oncology | Admitting: Gynecologic Oncology

## 2014-05-24 ENCOUNTER — Encounter: Payer: Self-pay | Admitting: Gynecologic Oncology

## 2014-05-24 VITALS — BP 119/69 | HR 92 | Temp 98.3°F | Resp 18 | Ht 65.5 in | Wt 150.6 lb

## 2014-05-24 DIAGNOSIS — N839 Noninflammatory disorder of ovary, fallopian tube and broad ligament, unspecified: Secondary | ICD-10-CM

## 2014-05-24 DIAGNOSIS — N920 Excessive and frequent menstruation with regular cycle: Secondary | ICD-10-CM | POA: Diagnosis not present

## 2014-05-24 DIAGNOSIS — N939 Abnormal uterine and vaginal bleeding, unspecified: Secondary | ICD-10-CM

## 2014-05-24 DIAGNOSIS — R971 Elevated cancer antigen 125 [CA 125]: Secondary | ICD-10-CM

## 2014-05-24 DIAGNOSIS — R1909 Other intra-abdominal and pelvic swelling, mass and lump: Secondary | ICD-10-CM | POA: Insufficient documentation

## 2014-05-24 DIAGNOSIS — D250 Submucous leiomyoma of uterus: Secondary | ICD-10-CM | POA: Insufficient documentation

## 2014-05-24 DIAGNOSIS — N838 Other noninflammatory disorders of ovary, fallopian tube and broad ligament: Secondary | ICD-10-CM

## 2014-05-24 NOTE — Progress Notes (Signed)
Consult Note: Gyn-Onc  Consult was requested by Dr. Harrington Challenger  for the evaluation of Sheryl Gonzalez 48 y.o. female  CC:  Chief Complaint  Patient presents with  . Vaginal Bleeding  . Right ovarian mass  . Elevated CA 125    Assessment/Plan:  [name]  is a 48 y.o.  With a midline 9cm mass on examination and an elevated CA125.  Recommendation made to the patient was for [facility] right oophorectomy and bilateral salpingectomy.  If malignancy is identified then the procedure will be extended to include hysterectomy, BSO omentectomy LND and other staging/debulking procedures.  If the contralateral ovary appears suspicious [name] A Dunavant consents to BSO.  It is her preference to retain her uterus if possible. Procedure planned for 06/26/2014  Will repeat UTZ in December  prior to procedure.  The risks and benefits of the procedure were reviewed and are inclusive of infection, bleeding damage to surrounding structures, prolonged hospitalization reoperation.  She is also aware that laparotomy will be performed if the procedure cannot be completed safely in a minimally invasive approach.  HPI: [name]  is a 48 y.o.  G0 with worsening menorrhagia over the past few years.  Utz 05/17/2014 on day 5 of her cycle was notable for 7.8cm complex right ovarian mass.  Submucosal fibroids 5.6cm and 3.94cm were also identified.  CA 125 = 102.  No abdominal bloating, no change in appetite, no change in bowel or bladder habits.  FH notable for a mother diagnoses with malignant ascites of suspected ovarian origin dx at 59.    Review of Systems:  Constitutional  Feels well,  Good appetite Cardiovascular  No chest pain, shortness of breath, or edema  Pulmonary  No cough or wheeze.  Gastro Intestinal  No nausea, vomitting, or diarrhoea. No bright red blood per rectum, no abdominal pain, change in bowel movement, or constipation.  Genito Urinary  No frequency, urgency,  dysuria,heavy menses Musculo Skeletal  No myalgia, arthralgia, joint swelling or pain  Neurologic  No weakness, numbness, change in gait,  Psychology  No depression, anxiety, insomnia.    Current Meds:  Outpatient Encounter Prescriptions as of 05/24/2014  Medication Sig  . Cholecalciferol (VITAMIN D-3) 1000 UNITS CAPS Take by mouth daily.  . citalopram (CELEXA) 10 MG tablet Take 10 mg by mouth at bedtime.  . cyanocobalamin 500 MCG tablet Take 500 mcg by mouth daily.  . fluticasone (FLONASE) 50 MCG/ACT nasal spray Place 2 sprays into the nose daily.  . Lactobacillus (PROBIOTIC ACIDOPHILUS PO) Take by mouth.  . Multiple Vitamins-Minerals (ADULT GUMMY) CHEW Chew 2 tablets by mouth daily.  [name] OVER THE COUNTER MEDICATION Hair, Skin and Nails  . pyridoxine (B-6) 200 MG tablet Take 200 mg by mouth daily.  . vitamin E 400 UNIT capsule Take 400 Units by mouth daily.  . [DISCONTINUED] oxyCODONE-acetaminophen (ROXICET) 5-325 MG per tablet Take 1-2 tablets by mouth every 4 (four) hours as needed for pain.    Allergy: No Known Allergies  Social Hx:   History   Social History  . Marital Status: Single    Spouse Name: N/A    Number of Children: N/A  . Years of Education: N/A   Occupational History  . Not on file.   Social History Main Topics  . Smoking status: Never Smoker   . Smokeless tobacco: Never Used  . Alcohol Use: No  . Drug Use: No  . Sexual Activity: No   Other Topics Concern  .  Not on file   Social History Narrative    Past Surgical Hx:  Past Surgical History  Procedure Laterality Date  . Breast biopsy  2009    lt  . Colonoscopy    . Breast biopsy Left 10/12/2012    Procedure: LEFT NEEDLE LOCALIZATION EXCISIONAL BREAST BIOPSY;  Surgeon: Stark Klein, MD;  Location: [facility];  Service: General;  Laterality: Left;  . Breast ductal system excision Left 10/12/2012    Procedure: MAJOR DUCTAL EXCISION;  Surgeon: Stark Klein, MD;  Location: [place];  Service: General;  Laterality: Left;    Past Medical Hx:  Past Medical History  Diagnosis Date  . Anxiety   . Depression   . Seasonal allergies   . Wears glasses   . Menorrhagia   . Iron deficiency anemia due to chronic blood loss     Past Gynecological History:  G0, LMP 05/13/2014.  Menorrhagia over the past few years.  No h/o abnormal pap.  Not sexually active.   Family Hx:  Family History  Problem Relation Age of Onset  . Arthritis Other   . Uterine cancer      parent  . Kidney disease Other   . Diabetes Other   . Cancer Mother     ? Cervical/Ovarian    Vitals:  Blood pressure 119/69, pulse 92, temperature 98.3 F (36.8 C), temperature source Oral, resp. rate 18, height 5' 5.5" (1.664 m), weight 150 lb 9.6 oz (68.312 kg).  Physical Exam: WD in NAD Neck  Supple NROM, without any enlargements.  Lymph Node Survey No cervical supraclavicular or inguinal adenopathy Cardiovascular  Pulse normal rate, regularity and rhythm. S1 and S2 normal.  Lungs  Clear to auscultation bilaterally.  Good air movement.  Skin  No rash/lesions/breakdown  Psychiatry  Alert and oriented appropriate mood affect speech and reasoning. Abdomen  Normoactive bowel sounds, abdomen soft, non-tender.  Back No CVA tenderness Genito Urinary  Vulva/vagina: Normal external female genitalia.  No lesions. No discharge or bleeding.  Bladder/urethra:  No lesions or masses  Vagina:narrow, speculum not placed.    Cervix: Normal by palpation.  Uterus: Mobile, no parametrial involvement or nodularity.  Adnexa: 9cm midline adnexal mass, with an area of firmness Rectal  Good tone, no masses no cul de sac nodularity.  Extremities  No bilateral cyanosis, clubbing or edema.   [name], MD, PhD 05/24/2014, 5:01 PM

## 2014-05-24 NOTE — Patient Instructions (Signed)
Preparing for your Surgery  Plan for surgery on December 15 with Dr. Skeet Latch.  Pre-operative Testing -You will receive a phone call from presurgical testing at [facility] to arrange for a pre-operative testing appointment before your surgery.  This appointment normally occurs one to two weeks before your scheduled surgery.   -Bring your insurance card, copy of an advanced directive if applicable, medication list  -At that visit, you will be asked to sign a consent for a possible blood transfusion in case a transfusion becomes necessary during surgery.  The need for a blood transfusion is rare but having consent is a necessary part of your care.     -You should not be taking blood thinners or aspirin at least ten days prior to surgery unless instructed by your surgeon.  Day Before Surgery at [facility] will be asked to take in only clear liquids the day before surgery.  Examples of clear liquids include broths, jello, and clear juices. You will be advised to have nothing to eat or drink after midnight the evening before.    Your role in recovery Your role is to become active as soon as directed by your doctor, while still giving yourself time to heal.  Rest when you feel tired. You will be asked to do the following in order to speed your recovery:  - Cough and breathe deeply. This helps toclear and expand your lungs and can prevent pneumonia. You may be given a spirometer to practice deep breathing. A staff member will show you how to use the spirometer. - Do mild physical activity. Walking or moving your legs help your circulation and body functions return to normal. A staff member will help you when you try to walk and will provide you with simple exercises. Do not try to get up or walk alone the first time. - Actively manage your pain. Managing your pain lets you move in comfort. We will ask you to rate your pain on a scale of zero to 10. It is your responsibility to  tell your doctor or nurse where and how much you hurt so your pain can be treated.  Special Considerations -If you are diabetic, you may be placed on insulin after surgery to have closer control over your blood sugars to promote healing and recovery.  This does not mean that you will be discharged on insulin.  If applicable, your oral antidiabetics will be resumed when you are tolerating a solid diet.  -Your final pathology results from surgery should be available by the Friday after surgery and the results will be relayed to you when available.

## 2014-06-01 ENCOUNTER — Ambulatory Visit (HOSPITAL_COMMUNITY)
Admission: RE | Admit: 2014-06-01 | Payer: BC Managed Care – PPO | Source: Ambulatory Visit | Admitting: Obstetrics and Gynecology

## 2014-06-01 ENCOUNTER — Encounter (HOSPITAL_COMMUNITY): Admission: RE | Payer: Self-pay | Source: Ambulatory Visit

## 2014-06-01 SURGERY — DILATATION & CURETTAGE/HYSTEROSCOPY WITH NOVASURE ABLATION
Anesthesia: Choice

## 2014-06-11 ENCOUNTER — Ambulatory Visit (HOSPITAL_COMMUNITY)
Admission: RE | Admit: 2014-06-11 | Discharge: 2014-06-11 | Disposition: A | Discharge status: Home / Self Care | Payer: BC Managed Care – PPO | Source: Ambulatory Visit | Attending: Gynecologic Oncology | Admitting: Gynecologic Oncology

## 2014-06-11 DIAGNOSIS — R971 Elevated cancer antigen 125 [CA 125]: Secondary | ICD-10-CM | POA: Diagnosis not present

## 2014-06-11 DIAGNOSIS — D259 Leiomyoma of uterus, unspecified: Secondary | ICD-10-CM | POA: Diagnosis not present

## 2014-06-11 DIAGNOSIS — N839 Noninflammatory disorder of ovary, fallopian tube and broad ligament, unspecified: Secondary | ICD-10-CM | POA: Insufficient documentation

## 2014-06-11 DIAGNOSIS — N838 Other noninflammatory disorders of ovary, fallopian tube and broad ligament: Secondary | ICD-10-CM

## 2014-06-12 ENCOUNTER — Encounter: Payer: Self-pay | Admitting: Gynecologic Oncology

## 2014-06-13 ENCOUNTER — Telehealth: Payer: Self-pay | Admitting: Gynecologic Oncology

## 2014-06-13 NOTE — Telephone Encounter (Signed)
Patient informed of ultrasound results.  Advised that she would be contacted with Dr. Leone Brand recommendations.  Reporting starting her menstrual cycle Nov 30 and reports having heavy bleeding in the past .  She states she was going to have an ablation due to her heavy cycles but could not due to her fibroids.  Advised to call for any questions or concerns and that she would be contacted from our office.

## 2014-06-21 ENCOUNTER — Encounter (HOSPITAL_COMMUNITY)
Admission: RE | Admit: 2014-06-21 | Discharge: 2014-06-21 | Disposition: A | Discharge status: Home / Self Care | Payer: BC Managed Care – PPO | Source: Ambulatory Visit | Attending: Gynecologic Oncology | Admitting: Gynecologic Oncology

## 2014-06-21 ENCOUNTER — Encounter (HOSPITAL_COMMUNITY): Payer: Self-pay

## 2014-06-21 DIAGNOSIS — Z01812 Encounter for preprocedural laboratory examination: Secondary | ICD-10-CM | POA: Diagnosis present

## 2014-06-21 LAB — COMPREHENSIVE METABOLIC PANEL
ALBUMIN: 4 g/dL (ref 3.5–5.2)
ALK PHOS: 47 U/L (ref 39–117)
ALT: 10 U/L (ref 0–35)
ANION GAP: 11 (ref 5–15)
AST: 19 U/L (ref 0–37)
BILIRUBIN TOTAL: 0.2 mg/dL — AB (ref 0.3–1.2)
BUN: 16 mg/dL (ref 6–23)
CHLORIDE: 102 meq/L (ref 96–112)
CO2: 26 mEq/L (ref 19–32)
Calcium: 9.3 mg/dL (ref 8.4–10.5)
Creatinine, Ser: 0.85 mg/dL (ref 0.50–1.10)
GFR calc Af Amer: >90 mL/min (ref >90)
GFR calc non Af Amer: 79 mL/min — ABNORMAL LOW (ref >90)
GLUCOSE: 93 mg/dL (ref 70–99)
POTASSIUM: 4.3 meq/L (ref 3.7–5.3)
Sodium: 139 mEq/L (ref 137–147)
Total Protein: 7.2 g/dL (ref 6.0–8.3)

## 2014-06-21 LAB — CBC WITH DIFFERENTIAL/PLATELET
BASOS PCT: 1 % (ref 0–1)
Basophils Absolute: 0 10*3/uL (ref 0.0–0.1)
EOS PCT: 8 % — AB (ref 0–5)
Eosinophils Absolute: 0.3 10*3/uL (ref 0.0–0.7)
HEMATOCRIT: 34.1 % — AB (ref 36.0–46.0)
HEMOGLOBIN: 10.4 g/dL — AB (ref 12.0–15.0)
Lymphocytes Relative: 42 % (ref 12–46)
Lymphs Abs: 1.4 10*3/uL (ref 0.7–4.0)
MCH: 21.9 pg — ABNORMAL LOW (ref 26.0–34.0)
MCHC: 30.5 g/dL (ref 30.0–36.0)
MCV: 71.8 fL — AB (ref 78.0–100.0)
MONOS PCT: 10 % (ref 3–12)
Monocytes Absolute: 0.3 10*3/uL (ref 0.1–1.0)
NEUTROS ABS: 1.2 10*3/uL — AB (ref 1.7–7.7)
Neutrophils Relative %: 39 % — ABNORMAL LOW (ref 43–77)
PLATELETS: 333 10*3/uL (ref 150–400)
RBC: 4.75 MIL/uL (ref 3.87–5.11)
RDW: 18.5 % — ABNORMAL HIGH (ref 11.5–15.5)
WBC: 3.2 10*3/uL — AB (ref 4.0–10.5)

## 2014-06-21 LAB — URINALYSIS, ROUTINE W REFLEX MICROSCOPIC
Bilirubin Urine: NEGATIVE
GLUCOSE, UA: NEGATIVE mg/dL
HGB URINE DIPSTICK: NEGATIVE
KETONES UR: NEGATIVE mg/dL
LEUKOCYTES UA: NEGATIVE
Nitrite: NEGATIVE
PH: 6 (ref 5.0–8.0)
PROTEIN: NEGATIVE mg/dL
Specific Gravity, Urine: 1.017 (ref 1.005–1.030)
Urobilinogen, UA: 0.2 mg/dL (ref 0.0–1.0)

## 2014-06-21 LAB — HCG, SERUM, QUALITATIVE: Preg, Serum: NEGATIVE

## 2014-06-21 LAB — ABO/RH: ABO/RH(D): A POS

## 2014-06-21 NOTE — Pre-Procedure Instructions (Signed)
EKG AND CXR NOT NEEDED PER ANESTHESIOLOGIST'S GUIDELINES. 

## 2014-06-21 NOTE — Patient Instructions (Addendum)
Sheryl Gonzalez  06/21/2014   CLEAR LIQUID DIET ALL DAY - THE DAY BEFORE SURGERY - SEE LIST OF CLEAR LIQUIDS PROVIDED WITH THESE INSTRUCTIONS   Your procedure is scheduled on:  Tuesday  December 15th  Report to [facility] at  5:30 AM.  Call this number if you have problems the morning of surgery [phone]   Remember:  Do not eat food or drink liquids :After Midnight.     Take these medicines the morning of surgery with A SIP OF WATER:   DO NOT TAKE ANY MEDICATIONS THE AM OF SURGERY                               You may not have any metal on your body including hair pins and              piercings  Do not wear jewelry, make-up, lotions, powders or perfumes.             Do not wear nail polish.  Do not shave  48 hours prior to surgery.              Men may shave face and neck.   Do not bring valuables to the hospital. [place].  Contacts, dentures or bridgework may not be worn into surgery.  Leave suitcase in the car. After surgery it may be brought to your room.     Patients discharged the day of surgery will not be allowed to drive home.  Name and phone number of your driver:  Special Instructions: AFTER YOUR SURGERY - REMEMBER TO DO DEEP BREATHING, COUGHING; TURN FREQUENTLY WHEN LYING IN BED AND MOVE YOUR LEGS UP AND DOWN.  THESE EXERCISES HELP PREVENT LUNG COMPLICATIONS AND BLOOD CLOTS.               Please read over the following fact sheets you were given: _____________________________________________________________________             [facility] - Preparing for Surgery Before surgery, you can play an important role.  Because skin is not sterile, your skin needs to be as free of germs as possible.  You can reduce the number of germs on your skin by washing with CHG (chlorahexidine gluconate) soap before surgery.  CHG is an antiseptic cleaner which kills germs  and bonds with the skin to continue killing germs even after washing. Please DO NOT use if you have an allergy to CHG or antibacterial soaps.  If your skin becomes reddened/irritated stop using the CHG and inform your nurse when you arrive at Short Stay. Do not shave (including legs and underarms) for at least 48 hours prior to the first CHG shower.  You may shave your face/neck. Please follow these instructions carefully:  1.  Shower with CHG Soap the night before surgery and the  morning of Surgery.  2.  If you choose to wash your hair, wash your hair first as usual with your  normal  shampoo.  3.  After you shampoo, rinse your hair and body thoroughly to remove the  shampoo.  4.  Use CHG as you would any other liquid soap.  You can apply chg directly  to the skin and wash                       Gently with a scrungie or clean washcloth.  5.  Apply the CHG Soap to your body ONLY FROM THE NECK DOWN.   Do not use on face/ open                           Wound or open sores. Avoid contact with eyes, ears mouth and genitals (private parts).                       Wash face,  Genitals (private parts) with your normal soap.             6.  Wash thoroughly, paying special attention to the area where your surgery  will be performed.  7.  Thoroughly rinse your body with warm water from the neck down.  8.  DO NOT shower/wash with your normal soap after using and rinsing off  the CHG Soap.                9.  Pat yourself dry with a clean towel.            10.  Wear clean pajamas.            11.  Place clean sheets on your bed the night of your first shower and do not  sleep with pets. Day of Surgery : Do not apply any lotions/deodorants the morning of surgery.  Please wear clean clothes to the hospital/surgery center.  FAILURE TO FOLLOW THESE INSTRUCTIONS MAY RESULT IN THE CANCELLATION OF YOUR SURGERY PATIENT SIGNATURE_________________________________  NURSE  SIGNATURE__________________________________  ________________________________________________________________________   [name]  An incentive spirometer is a tool that can help keep your lungs clear and active. This tool measures how well you are filling your lungs with each breath. Taking long deep breaths may help reverse or decrease the chance of developing breathing (pulmonary) problems (especially infection) following:  A long period of time when you are unable to move or be active. BEFORE THE PROCEDURE   If the spirometer includes an indicator to show your best effort, your nurse or respiratory therapist will set it to a desired goal.  If possible, sit up straight or lean slightly forward. Try not to slouch.  Hold the incentive spirometer in an upright position. INSTRUCTIONS FOR USE   Sit on the edge of your bed if possible, or sit up as far as you can in bed or on a chair.  Hold the incentive spirometer in an upright position.  Breathe out normally.  Place the mouthpiece in your mouth and seal your lips tightly around it.  Breathe in slowly and as deeply as possible, raising the piston or the ball toward the top of the column.  Hold your breath for 3-5 seconds or for as long as possible. Allow the piston or ball to fall to the bottom of the column.  Remove the mouthpiece from your mouth and breathe out normally.  Rest for a few seconds and repeat Steps 1 through 7 at least 10 times every 1-2 hours when you are awake. Take your time and take a few normal breaths between deep breaths.  The spirometer may include an indicator to show  your best effort. Use the indicator as a goal to work toward during each repetition.  After each set of 10 deep breaths, practice coughing to be sure your lungs are clear. If you have an incision (the cut made at the time of surgery), support your incision when coughing by placing a pillow or rolled up towels firmly against it. Once  you are able to get out of bed, walk around indoors and cough well. You may stop using the incentive spirometer when instructed by your caregiver.  RISKS AND COMPLICATIONS  Take your time so you do not get dizzy or light-headed.  If you are in pain, you may need to take or ask for pain medication before doing incentive spirometry. It is harder to take a deep breath if you are having pain. AFTER USE  Rest and breathe slowly and easily.  It can be helpful to keep track of a log of your progress. Your caregiver can provide you with a simple table to help with this. If you are using the spirometer at home, follow these instructions: [place] IF:   You are having difficultly using the spirometer.  You have trouble using the spirometer as often as instructed.  Your pain medication is not giving enough relief while using the spirometer.  You develop fever of 100.5 F (38.1 C) or higher. SEEK IMMEDIATE MEDICAL CARE IF:   You cough up bloody sputum that had not been present before.  You develop fever of 102 F (38.9 C) or greater.  You develop worsening pain at or near the incision site. MAKE SURE YOU:   Understand these instructions.  Will watch your condition.  Will get help right away if you are not doing well or get worse. Document Released: 11/09/2006 Document Revised: 09/21/2011 Document Reviewed: 01/10/2007 ExitCare Patient Information 2014 ExitCare, [place].   ________________________________________________________________________  WHAT IS A BLOOD TRANSFUSION? Blood Transfusion Information  A transfusion is the replacement of blood or some of its parts. Blood is made up of multiple cells which provide different functions.  Red blood cells carry oxygen and are used for blood loss replacement.  White blood cells fight against infection.  Platelets control bleeding.  Plasma helps clot blood.  Other blood products are available for specialized needs, such as  hemophilia or other clotting disorders. BEFORE THE TRANSFUSION  Who gives blood for transfusions?   Healthy volunteers who are fully evaluated to make sure their blood is safe. This is blood bank blood. Transfusion therapy is the safest it has ever been in the practice of medicine. Before blood is taken from a donor, a complete history is taken to make sure that person has no history of diseases nor engages in risky social behavior (examples are intravenous drug use or sexual activity with multiple partners). The donor's travel history is screened to minimize risk of transmitting infections, such as malaria. The donated blood is tested for signs of infectious diseases, such as HIV and hepatitis. The blood is then tested to be sure it is compatible with you in order to minimize the chance of a transfusion reaction. If you or a relative donates blood, this is often done in anticipation of surgery and is not appropriate for emergency situations. It takes many days to process the donated blood. RISKS AND COMPLICATIONS Although transfusion therapy is very safe and saves many lives, the main dangers of transfusion include:   Getting an infectious disease.  Developing a transfusion reaction. This is an allergic reaction to  something in the blood you were given. Every precaution is taken to prevent this. The decision to have a blood transfusion has been considered carefully by your caregiver before blood is given. Blood is not given unless the benefits outweigh the risks. AFTER THE TRANSFUSION  Right after receiving a blood transfusion, you will usually feel much better and more energetic. This is especially true if your red blood cells have gotten low (anemic). The transfusion raises the level of the red blood cells which carry oxygen, and this usually causes an energy increase.  The nurse administering the transfusion will monitor you carefully for complications. HOME CARE INSTRUCTIONS  No special  instructions are needed after a transfusion. You may find your energy is better. Speak with your caregiver about any limitations on activity for underlying diseases you may have. SEEK MEDICAL CARE IF:   Your condition is not improving after your transfusion.  You develop redness or irritation at the intravenous (IV) site. SEEK IMMEDIATE MEDICAL CARE IF:  Any of the following symptoms occur over the next 12 hours:  Shaking chills.  You have a temperature by mouth above 102 F (38.9 C), not controlled by medicine.  Chest, back, or muscle pain.  People around you feel you are not acting correctly or are confused.  Shortness of breath or difficulty breathing.  Dizziness and fainting.  You get a rash or develop hives.  You have a decrease in urine output.  Your urine turns a dark color or changes to pink, red, or brown. Any of the following symptoms occur over the next 10 days:  You have a temperature by mouth above 102 F (38.9 C), not controlled by medicine.  Shortness of breath.  Weakness after normal activity.  The white part of the eye turns yellow (jaundice).  You have a decrease in the amount of urine or are urinating less often.  Your urine turns a dark color or changes to pink, red, or brown. Document Released: 06/26/2000 Document Revised: 09/21/2011 Document Reviewed: 02/13/2008 ExitCare Patient Information 2014 ExitCare, [place].  _______________________________________________________________________   CLEAR LIQUID DIET   Foods Allowed                                                                     Foods Excluded  Coffee and tea, regular and decaf                             liquids that you cannot  Plain Jell-O in any flavor                                             see through such as: Fruit ices (not with fruit pulp)                                     milk, soups, orange juice  Iced Popsicles  All solid  food Carbonated beverages, regular and diet                                    Cranberry, grape and apple juices Sports drinks like Gatorade Lightly seasoned clear broth or consume(fat free) Sugar, honey syrup  Sample Menu Breakfast                                Lunch                                     Supper Cranberry juice                    Beef broth                            Chicken broth Jell-O                                     Grape juice                           Apple juice Coffee or tea                        Jell-O                                      Popsicle                                                Coffee or tea                        Coffee or tea  _____________________________________________________________________

## 2014-06-25 ENCOUNTER — Telehealth: Payer: Self-pay | Admitting: Nurse Practitioner

## 2014-06-25 NOTE — Anesthesia Preprocedure Evaluation (Signed)
Anesthesia Evaluation  Patient identified by MRN, date of birth, ID band Patient awake    Reviewed: Allergy & Precautions, H&P , NPO status , Patient's Chart, lab work & pertinent test results  History of Anesthesia Complications Negative for: history of anesthetic complications  Airway Mallampati: II  TM Distance: >3 FB Neck ROM: Full    Dental no notable dental hx. (+) Dental Advisory Given   Pulmonary neg pulmonary ROS,  breath sounds clear to auscultation  Pulmonary exam normal       Cardiovascular negative cardio ROS  Rhythm:Regular Rate:Normal     Neuro/Psych PSYCHIATRIC DISORDERS Anxiety Depression negative neurological ROS     GI/Hepatic negative GI ROS, Neg liver ROS,   Endo/Other  negative endocrine ROS  Renal/GU negative Renal ROS  negative genitourinary   Musculoskeletal negative musculoskeletal ROS (+)   Abdominal   Peds negative pediatric ROS (+)  Hematology  (+) anemia ,   Anesthesia Other Findings Ovarian mass  Reproductive/Obstetrics negative OB ROS                             Anesthesia Physical Anesthesia Plan  ASA: II  Anesthesia Plan: General   Post-op Pain Management:    Induction: Intravenous  Airway Management Planned: Oral ETT  Additional Equipment:   Intra-op Plan:   Post-operative Plan: Extubation in OR  Informed Consent: I have reviewed the patients History and Physical, chart, labs and discussed the procedure including the risks, benefits and alternatives for the proposed anesthesia with the patient or authorized representative who has indicated his/her understanding and acceptance.   Dental advisory given  Plan Discussed with: CRNA  Anesthesia Plan Comments:         Anesthesia Quick Evaluation

## 2014-06-25 NOTE — Telephone Encounter (Signed)
Called patient to see if she has any questions about tomorrow's procedure and remind her to be drinking only clear liquid diet today and nothing to eat or drink after midnight tonight. Patient verifies she is adhering to pre-op instructions and has no further questions or needs at this time.

## 2014-06-25 NOTE — H&P (Signed)
PRE OP HISTORY AND PHYSICAL   CC:  Chief Complaint  Patient presents with  . Vaginal Bleeding  . Right ovarian mass  . Elevated CA 125    Assessment/Plan:  [name] is a 48 y.o. With a midline 9cm mass on examination and an elevated CA125. Recommendation made to the patient was for [facility] right oophorectomy and bilateral salpingectomy. If malignancy is identified then the procedure will be extended to include hysterectomy, BSO omentectomy LND and other staging/debulking procedures. If the contralateral ovary appears suspicious [name] A Boley consents to BSO. It is her preference to retain her uterus if possible. Procedure planned for 06/26/2014.  Persistent right adnexal mass 06/11/2014 without free fluid.  Left adnenxa was not identified.    The risks and benefits of the procedure were reviewed and are inclusive of infection, bleeding damage to surrounding structures, prolonged hospitalization reoperation. She is also aware that laparotomy will be performed if the procedure cannot be completed safely in a minimally invasive approach.  HPI: [name] is a 48 y.o. G0 with worsening menorrhagia over the past few years. [place] 05/17/2014 on day 5 of her cycle was notable for 7.8cm complex right ovarian mass. Submucosal fibroids 5.6cm and 3.94cm were also identified. CA 125 = 102. No abdominal bloating, no change in appetite, no change in bowel or bladder habits. FH notable for a mother diagnoses with malignant ascites of suspected ovarian origin dx at 45.   Repeat UTZ 06/11/2014 c/w persistent right adnexal mass.  Left adnenexa was not identified.    Review of Systems:  Constitutional  Feels well, Good appetite Cardiovascular  No chest pain, shortness of breath, or edema  Pulmonary  No cough or wheeze.  Gastro Intestinal  No nausea, vomitting, or diarrhoea. No bright red blood per rectum, no abdominal pain, change in bowel movement,  or constipation.  Genito Urinary  No frequency, urgency, dysuria,heavy menses Musculo Skeletal  No myalgia, arthralgia, joint swelling or pain  Neurologic  No weakness, numbness, change in gait,  Psychology  No depression, anxiety, insomnia.    Current Meds:  Outpatient Encounter Prescriptions as of 05/24/2014  Medication Sig  . Cholecalciferol (VITAMIN D-3) 1000 UNITS CAPS Take by mouth daily.  . citalopram (CELEXA) 10 MG tablet Take 10 mg by mouth at bedtime.  . cyanocobalamin 500 MCG tablet Take 500 mcg by mouth daily.  . fluticasone (FLONASE) 50 MCG/ACT nasal spray Place 2 sprays into the nose daily.  . Lactobacillus (PROBIOTIC ACIDOPHILUS PO) Take by mouth.  . Multiple Vitamins-Minerals (ADULT GUMMY) CHEW Chew 2 tablets by mouth daily.  [name] OVER THE COUNTER MEDICATION Hair, Skin and Nails  . pyridoxine (B-6) 200 MG tablet Take 200 mg by mouth daily.  . vitamin E 400 UNIT capsule Take 400 Units by mouth daily.  . [DISCONTINUED] oxyCODONE-acetaminophen (ROXICET) 5-325 MG per tablet Take 1-2 tablets by mouth every 4 (four) hours as needed for pain.    Allergy: No Known Allergies  Social Hx:  History   Social History  . Marital Status: Single    Spouse Name: N/A    Number of Children: N/A  . Years of Education: N/A   Occupational History  . Not on file.   Social History Main Topics  . Smoking status: Never Smoker   . Smokeless tobacco: Never Used  . Alcohol Use: No  . Drug Use: No  . Sexual Activity: No   Other Topics Concern  . Not on file   Social  History Narrative    Past Surgical Hx:  Past Surgical History  Procedure Laterality Date  . Breast biopsy  2009    lt  . Colonoscopy    . Breast biopsy Left 10/12/2012    Procedure: LEFT NEEDLE LOCALIZATION EXCISIONAL BREAST BIOPSY; Surgeon: Stark Klein, MD; Location: [facility];  Service: General; Laterality: Left;  . Breast ductal system excision Left 10/12/2012    Procedure: MAJOR DUCTAL EXCISION; Surgeon: Stark Klein, MD; Location: [facility]; Service: General; Laterality: Left;    Past Medical Hx:  Past Medical History  Diagnosis Date  . Anxiety   . Depression   . Seasonal allergies   . Wears glasses   . Menorrhagia   . Iron deficiency anemia due to chronic blood loss     Past Gynecological History: G0, LMP 05/13/2014. Menorrhagia over the past few years. No h/o abnormal pap. Not sexually active.   Family Hx:  Family History  Problem Relation Age of Onset  . Arthritis Other   . Uterine cancer      parent  . Kidney disease Other   . Diabetes Other   . Cancer Mother     ? Cervical/Ovarian    Vitals: Blood pressure 119/69, pulse 92, temperature 98.3 F (36.8 C), temperature source Oral, resp. rate 18, height 5' 5.5" (1.664 m), weight 150 lb 9.6 oz (68.312 kg).  Physical Exam: WD in NAD Neck  Supple NROM, without any enlargements.  Lymph Node Survey No cervical supraclavicular or inguinal adenopathy Cardiovascular  Pulse normal rate, regularity and rhythm. S1 and S2 normal.  Lungs  Clear to auscultation bilaterally. Good air movement.  Skin  No rash/lesions/breakdown  Psychiatry  Alert and oriented appropriate mood affect speech and reasoning. Abdomen  Normoactive bowel sounds, abdomen soft, non-tender.  Back No CVA tenderness Genito Urinary  Vulva/vagina: Normal external female genitalia. No lesions. No discharge or bleeding. Bladder/urethra: No lesions or masses Vagina:narrow, speculum not placed.  Cervix: Normal by palpation. Uterus: Mobile, no parametrial involvement or nodularity. Adnexa: 9cm midline adnexal mass, with an area of firmness Rectal  Good tone, no masses no  cul de sac nodularity.  Extremities  No bilateral cyanosis, clubbing or edema.

## 2014-06-26 ENCOUNTER — Ambulatory Visit (HOSPITAL_COMMUNITY): Payer: BC Managed Care – PPO | Admitting: Anesthesiology

## 2014-06-26 ENCOUNTER — Encounter (HOSPITAL_COMMUNITY)
Admission: RE | Disposition: A | Discharge status: Home / Self Care | Payer: Self-pay | Source: Ambulatory Visit | Attending: Gynecologic Oncology

## 2014-06-26 ENCOUNTER — Encounter (HOSPITAL_COMMUNITY): Payer: Self-pay | Admitting: *Deleted

## 2014-06-26 ENCOUNTER — Ambulatory Visit (HOSPITAL_COMMUNITY)
Admission: RE | Admit: 2014-06-26 | Discharge: 2014-06-26 | Disposition: A | Discharge status: Home / Self Care | Payer: BC Managed Care – PPO | Source: Ambulatory Visit | Attending: Gynecologic Oncology | Admitting: Gynecologic Oncology

## 2014-06-26 DIAGNOSIS — R1900 Intra-abdominal and pelvic swelling, mass and lump, unspecified site: Secondary | ICD-10-CM | POA: Diagnosis present

## 2014-06-26 DIAGNOSIS — D649 Anemia, unspecified: Secondary | ICD-10-CM | POA: Diagnosis not present

## 2014-06-26 DIAGNOSIS — R971 Elevated cancer antigen 125 [CA 125]: Secondary | ICD-10-CM | POA: Diagnosis present

## 2014-06-26 DIAGNOSIS — N8320 Unspecified ovarian cysts: Secondary | ICD-10-CM | POA: Insufficient documentation

## 2014-06-26 DIAGNOSIS — N838 Other noninflammatory disorders of ovary, fallopian tube and broad ligament: Secondary | ICD-10-CM

## 2014-06-26 HISTORY — PX: ROBOTIC ASSISTED BILATERAL SALPINGO OOPHERECTOMY: SHX6078

## 2014-06-26 LAB — TYPE AND SCREEN
ABO/RH(D): A POS
Antibody Screen: NEGATIVE

## 2014-06-26 SURGERY — ROBOTIC ASSISTED BILATERAL SALPINGO OOPHORECTOMY
Anesthesia: General | Laterality: Bilateral

## 2014-06-26 MED ORDER — CEFAZOLIN SODIUM-DEXTROSE 2-3 GM-% IV SOLR
INTRAVENOUS | Status: AC
  Filled 2014-06-26: qty 50

## 2014-06-26 MED ORDER — FENTANYL CITRATE 0.05 MG/ML IJ SOLN
INTRAMUSCULAR | Status: DC | PRN
  Administered 2014-06-26 (×5): 50 ug via INTRAVENOUS

## 2014-06-26 MED ORDER — ONDANSETRON HCL 4 MG/2ML IJ SOLN: 4.0000 mg | Freq: Once | INTRAMUSCULAR | Status: DC | PRN

## 2014-06-26 MED ORDER — DEXAMETHASONE SODIUM PHOSPHATE 10 MG/ML IJ SOLN
INTRAMUSCULAR | Status: DC | PRN
  Administered 2014-06-26: 10 mg via INTRAVENOUS

## 2014-06-26 MED ORDER — CEFAZOLIN SODIUM-DEXTROSE 2-3 GM-% IV SOLR
2.0000 g | INTRAVENOUS | Status: AC
  Administered 2014-06-26: 2 g via INTRAVENOUS

## 2014-06-26 MED ORDER — LACTATED RINGERS IV SOLN
INTRAVENOUS | Status: DC | PRN
  Administered 2014-06-26 (×2): via INTRAVENOUS

## 2014-06-26 MED ORDER — OXYCODONE-ACETAMINOPHEN 10-325 MG PO TABS: 1.0000 | ORAL_TABLET | Freq: Four times a day (QID) | ORAL | Status: DC | PRN

## 2014-06-26 MED ORDER — ACETAMINOPHEN 650 MG RE SUPP
650.0000 mg | RECTAL | Status: DC | PRN
  Filled 2014-06-26: qty 1

## 2014-06-26 MED ORDER — LACTATED RINGERS IV SOLN: INTRAVENOUS | Status: DC

## 2014-06-26 MED ORDER — ROCURONIUM BROMIDE 100 MG/10ML IV SOLN
INTRAVENOUS | Status: AC
  Filled 2014-06-26: qty 1

## 2014-06-26 MED ORDER — PROPOFOL 10 MG/ML IV BOLUS
INTRAVENOUS | Status: DC | PRN
  Administered 2014-06-26: 150 mg via INTRAVENOUS

## 2014-06-26 MED ORDER — LACTATED RINGERS IR SOLN
Status: DC | PRN
  Administered 2014-06-26: 1000 mL

## 2014-06-26 MED ORDER — BUPIVACAINE LIPOSOME 1.3 % IJ SUSP
20.0000 mL | Freq: Once | INTRAMUSCULAR | Status: DC
  Filled 2014-06-26: qty 20

## 2014-06-26 MED ORDER — PROPOFOL 10 MG/ML IV BOLUS
INTRAVENOUS | Status: AC
  Filled 2014-06-26: qty 20

## 2014-06-26 MED ORDER — ONDANSETRON HCL 4 MG/2ML IJ SOLN
INTRAMUSCULAR | Status: AC
  Filled 2014-06-26: qty 2

## 2014-06-26 MED ORDER — EPHEDRINE SULFATE 50 MG/ML IJ SOLN
INTRAMUSCULAR | Status: AC
  Filled 2014-06-26: qty 1

## 2014-06-26 MED ORDER — SODIUM CHLORIDE 0.9 % IJ SOLN: 3.0000 mL | Freq: Two times a day (BID) | INTRAMUSCULAR | Status: DC

## 2014-06-26 MED ORDER — OXYCODONE HCL 5 MG PO TABS: 5.0000 mg | ORAL_TABLET | ORAL | Status: DC | PRN

## 2014-06-26 MED ORDER — EPHEDRINE SULFATE 50 MG/ML IJ SOLN
INTRAMUSCULAR | Status: DC | PRN
  Administered 2014-06-26: 5 mg via INTRAVENOUS

## 2014-06-26 MED ORDER — GLYCOPYRROLATE 0.2 MG/ML IJ SOLN
INTRAMUSCULAR | Status: AC
  Filled 2014-06-26: qty 3

## 2014-06-26 MED ORDER — FENTANYL CITRATE 0.05 MG/ML IJ SOLN: 25.0000 ug | INTRAMUSCULAR | Status: DC | PRN

## 2014-06-26 MED ORDER — NEOSTIGMINE METHYLSULFATE 10 MG/10ML IV SOLN
INTRAVENOUS | Status: DC | PRN
  Administered 2014-06-26: 5 mg via INTRAVENOUS

## 2014-06-26 MED ORDER — HEPARIN SODIUM (PORCINE) 1000 UNIT/ML IJ SOLN
INTRAMUSCULAR | Status: AC
  Filled 2014-06-26: qty 1

## 2014-06-26 MED ORDER — METOCLOPRAMIDE HCL 5 MG/ML IJ SOLN
INTRAMUSCULAR | Status: DC | PRN
  Administered 2014-06-26: 10 mg via INTRAVENOUS

## 2014-06-26 MED ORDER — NEOSTIGMINE METHYLSULFATE 10 MG/10ML IV SOLN
INTRAVENOUS | Status: AC
  Filled 2014-06-26: qty 1

## 2014-06-26 MED ORDER — DEXAMETHASONE SODIUM PHOSPHATE 10 MG/ML IJ SOLN
INTRAMUSCULAR | Status: AC
  Filled 2014-06-26: qty 1

## 2014-06-26 MED ORDER — METOCLOPRAMIDE HCL 5 MG/ML IJ SOLN
INTRAMUSCULAR | Status: AC
Start: 2014-06-26 — End: 2014-06-26
  Filled 2014-06-26: qty 2

## 2014-06-26 MED ORDER — ACETAMINOPHEN 325 MG PO TABS: 650.0000 mg | ORAL_TABLET | ORAL | Status: DC | PRN

## 2014-06-26 MED ORDER — SUCCINYLCHOLINE CHLORIDE 20 MG/ML IJ SOLN
INTRAMUSCULAR | Status: DC | PRN
  Administered 2014-06-26: 100 mg via INTRAVENOUS

## 2014-06-26 MED ORDER — HYDROMORPHONE HCL 2 MG/ML IJ SOLN
INTRAMUSCULAR | Status: AC
  Filled 2014-06-26: qty 1

## 2014-06-26 MED ORDER — SODIUM CHLORIDE 0.9 % IV SOLN: 250.0000 mL | INTRAVENOUS | Status: DC | PRN

## 2014-06-26 MED ORDER — MIDAZOLAM HCL 5 MG/5ML IJ SOLN
INTRAMUSCULAR | Status: DC | PRN
  Administered 2014-06-26: 2 mg via INTRAVENOUS

## 2014-06-26 MED ORDER — SODIUM CHLORIDE 0.9 % IJ SOLN
INTRAMUSCULAR | Status: AC
  Filled 2014-06-26: qty 10

## 2014-06-26 MED ORDER — MIDAZOLAM HCL 2 MG/2ML IJ SOLN
INTRAMUSCULAR | Status: AC
  Filled 2014-06-26: qty 2

## 2014-06-26 MED ORDER — LIDOCAINE HCL (CARDIAC) 20 MG/ML IV SOLN
INTRAVENOUS | Status: AC
  Filled 2014-06-26: qty 5

## 2014-06-26 MED ORDER — HYDROMORPHONE HCL 1 MG/ML IJ SOLN
INTRAMUSCULAR | Status: DC | PRN
  Administered 2014-06-26 (×2): 0.5 mg via INTRAVENOUS
  Administered 2014-06-26: 1 mg via INTRAVENOUS
  Administered 2014-06-26 (×2): 0.5 mg via INTRAVENOUS

## 2014-06-26 MED ORDER — GLYCOPYRROLATE 0.2 MG/ML IJ SOLN
INTRAMUSCULAR | Status: DC | PRN
  Administered 2014-06-26: 0.6 mg via INTRAVENOUS

## 2014-06-26 MED ORDER — SODIUM CHLORIDE 0.9 % IJ SOLN: 3.0000 mL | INTRAMUSCULAR | Status: DC | PRN

## 2014-06-26 MED ORDER — STERILE WATER FOR IRRIGATION IR SOLN
Status: DC | PRN
  Administered 2014-06-26: 3000 mL

## 2014-06-26 MED ORDER — LIDOCAINE HCL (CARDIAC) 20 MG/ML IV SOLN
INTRAVENOUS | Status: DC | PRN
  Administered 2014-06-26: 100 mg via INTRAVENOUS

## 2014-06-26 MED ORDER — ROCURONIUM BROMIDE 100 MG/10ML IV SOLN
INTRAVENOUS | Status: DC | PRN
  Administered 2014-06-26: 40 mg via INTRAVENOUS

## 2014-06-26 MED ORDER — FENTANYL CITRATE 0.05 MG/ML IJ SOLN
INTRAMUSCULAR | Status: AC
  Filled 2014-06-26: qty 5

## 2014-06-26 MED ORDER — ONDANSETRON HCL 4 MG/2ML IJ SOLN
INTRAMUSCULAR | Status: DC | PRN
  Administered 2014-06-26: 4 mg via INTRAVENOUS

## 2014-06-26 SURGICAL SUPPLY — 55 items
BENZOIN TINCTURE PRP APPL 2/3 (GAUZE/BANDAGES/DRESSINGS) IMPLANT
CHLORAPREP W/TINT 26ML (MISCELLANEOUS) ×2 IMPLANT
CORD HIGH FREQUENCY UNIPOLAR (ELECTROSURGICAL) ×2 IMPLANT
CORDS BIPOLAR (ELECTRODE) ×2 IMPLANT
COVER SURGICAL LIGHT HANDLE (MISCELLANEOUS) ×2 IMPLANT
COVER TIP SHEARS 8 DVNC (MISCELLANEOUS) ×1 IMPLANT
COVER TIP SHEARS 8MM DA VINCI (MISCELLANEOUS) ×1
DECANTER SPIKE VIAL GLASS SM (MISCELLANEOUS) IMPLANT
DERMABOND ADVANCED (GAUZE/BANDAGES/DRESSINGS) ×1
DERMABOND ADVANCED .7 DNX12 (GAUZE/BANDAGES/DRESSINGS) ×1 IMPLANT
DRAPE SHEET LG 3/4 BI-LAMINATE (DRAPES) ×4 IMPLANT
DRAPE SURG IRRIG POUCH 19X23 (DRAPES) ×2 IMPLANT
DRAPE TABLE BACK 44X90 PK DISP (DRAPES) ×4 IMPLANT
DRAPE UTILITY XL STRL (DRAPES) ×2 IMPLANT
DRAPE WARM FLUID 44X44 (DRAPE) ×2 IMPLANT
DRSG TEGADERM 2-3/8X2-3/4 SM (GAUZE/BANDAGES/DRESSINGS) ×2 IMPLANT
DRSG TEGADERM 4X4.75 (GAUZE/BANDAGES/DRESSINGS) ×2 IMPLANT
DRSG TEGADERM 6X8 (GAUZE/BANDAGES/DRESSINGS) ×4 IMPLANT
ELECT REM PT RETURN 9FT ADLT (ELECTROSURGICAL) ×2
ELECTRODE REM PT RTRN 9FT ADLT (ELECTROSURGICAL) ×1 IMPLANT
GAUZE SPONGE 2X2 8PLY STRL LF (GAUZE/BANDAGES/DRESSINGS) ×2 IMPLANT
GLOVE BIO SURGEON STRL SZ 6.5 (GLOVE) ×8 IMPLANT
GLOVE BIO SURGEON STRL SZ7.5 (GLOVE) IMPLANT
GLOVE BIOGEL PI IND STRL 7.0 (GLOVE) ×2 IMPLANT
GLOVE BIOGEL PI INDICATOR 7.0 (GLOVE) ×2
GLOVE ECLIPSE 7.5 STRL STRAW (GLOVE) ×6 IMPLANT
GOWN STRL REUS W/ TWL XL LVL3 (GOWN DISPOSABLE) ×5 IMPLANT
GOWN STRL REUS W/TWL XL LVL3 (GOWN DISPOSABLE) ×5
HOLDER FOLEY CATH W/STRAP (MISCELLANEOUS) ×2 IMPLANT
KIT ACCESSORY DA VINCI DISP (KITS) ×1
KIT ACCESSORY DVNC DISP (KITS) ×1 IMPLANT
MANIPULATOR UTERINE 4.5 ZUMI (MISCELLANEOUS) ×2 IMPLANT
OCCLUDER COLPOPNEUMO (BALLOONS) ×2 IMPLANT
PAD POSITIONING PINK XL (MISCELLANEOUS) ×2 IMPLANT
PEN SKIN MARKING BROAD (MISCELLANEOUS) ×2 IMPLANT
POUCH SPECIMEN RETRIEVAL 10MM (ENDOMECHANICALS) ×2 IMPLANT
SET TUBE IRRIG SUCTION NO TIP (IRRIGATION / IRRIGATOR) ×2 IMPLANT
SHEET LAVH (DRAPES) ×2 IMPLANT
SOLUTION ELECTROLUBE (MISCELLANEOUS) ×2 IMPLANT
SPONGE GAUZE 2X2 STER 10/PKG (GAUZE/BANDAGES/DRESSINGS) ×2
SPONGE LAP 18X18 X RAY DECT (DISPOSABLE) IMPLANT
STRIP CLOSURE SKIN 1/2X4 (GAUZE/BANDAGES/DRESSINGS) IMPLANT
SUT VIC AB 0 CT1 27 (SUTURE)
SUT VIC AB 0 CT1 27XBRD ANTBC (SUTURE) IMPLANT
SUT VIC AB 4-0 PS2 27 (SUTURE) ×4 IMPLANT
SUT VICRYL 0 UR6 27IN ABS (SUTURE) ×4 IMPLANT
SYR BULB IRRIGATION 50ML (SYRINGE) IMPLANT
TOWEL OR 17X26 10 PK STRL BLUE (TOWEL DISPOSABLE) ×4 IMPLANT
TRAP SPECIMEN MUCOUS 40CC (MISCELLANEOUS) ×2 IMPLANT
TRAY FOLEY CATH 14FRSI W/METER (CATHETERS) ×2 IMPLANT
TRAY LAPAROSCOPIC (CUSTOM PROCEDURE TRAY) ×2 IMPLANT
TROCAR XCEL 12X100 BLDLESS (ENDOMECHANICALS) ×2 IMPLANT
TROCAR XCEL BLUNT TIP 100MML (ENDOMECHANICALS) ×2 IMPLANT
TUBING INSUFFLATION 10FT LAP (TUBING) ×2 IMPLANT
WATER STERILE IRR 1500ML POUR (IV SOLUTION) ×4 IMPLANT

## 2014-06-26 NOTE — Interval H&P Note (Signed)
History and Physical Interval Note:  06/26/2014 7:25 AM  Sheryl Gonzalez  has presented today for surgery, with the diagnosis of ovarian mass  The various methods of treatment have been discussed with the patient and family. After consideration of risks, benefits and other options for treatment, the patient has consented to  Procedure(s): ROBOTIC ASSISTED BILATERAL SALPINGO UNILATERAL OOPHORECTOMY (Bilateral) as a surgical intervention .  The patient's history has been reviewed, patient examined, no change in status, stable for surgery.  I have reviewed the patient's chart and labs.  Questions were answered to the patient's satisfaction.     [place], [facility]

## 2014-06-26 NOTE — Op Note (Signed)
Preoperative Diagnosis: Right adnexal mass, elevated CA 125  Postoperative Diagnosis:Right hemorrhagic ovarian cyst.  Procedure(s) Performed: Robotic Bilateral salpingectomy  Right oophorectomy,   Anesthesia: GET  Surgeon: Francetta Found.  Skeet Latch, M.D. PhD  Assistant Surgeon:Lisa Jackson_Moore MD.   Specimens: Bilateral fallopian tubes, right ovary  Estimated Blood Loss: min mL.    Complications: None  Indication for Procedure: This is a 48 y.o.  who presented with a right pelvic mass and an elevated CA 125.  Operative Findings:  8cm uterus, 5 cm posterior fibroid.  Right 9 cm adnexal mass with chocolate fluid.  Normal left adnexa.  Endometriotic lesions in the cul de sac.  Normal omentum, no evidence of peritoneal disease.    Procedure: Patient was taken to the operating room and placed under general endotracheal anesthesia without any difficulty. She is placed in the dorsal lithotomy position and secured to the operative table over the chest with tape.   The patient was prepped and draped and the uterine manipulator placed within the endometrial cavity. T. An OG tube was present and functional. At an area on the left in line with the nipple approximately 2 cm below the ribs the area was infiltrated with 1% lidocaine and a 5 mm Optiview inserted under direct visualization. The abdomen was insufflated to 15 mm of mercury and the pressure never deviated above that throughout the remainder of the procedure. Maximum Trendelenburg positioning was obtained. An incision was made just superior to the umbilicus as well as the location 10 cm lateral to this incision and 2 cm inferior to the right ribs.  A 10 mm trocar was inserted in the superior umbilicus incision and the right upper quadrant.  8 millimeter robotic ports were placed in the other 3 incisions.  The small and large bowel were reflected as much as possible into the upper abdomen. The robot was docked and instruments placed.  Washings were  collected.    The right ureter was visualized, the peritoneum was incised and the right IP isolated cauterized and transected.  The utero-ovarian ligament was cauterized and transected.  The adhesions attaching the mass to the cul de sac and side wall were released.  During this inferior dissection the mass rupture and some of the content of the cyst- chocolate appearing material- were released into the pelvis.  The mass was placed in an Endo bag.  The left fallopian tube was transected from the meso salpinx and transected from the uterus.  It was delivered through the port.  The pelvis was copiously irrigated and drained and hemostasis was assured. Ares suspicious for endometriosis within the cu de sac were ablated.   The robot was undocked and the endo bag containing the right adnexal mass was delivered to the abdominal surface.  The specimen was morcellated within the endobag and extracted.  The specimen was sent to pathology.    Frozen section evaluation returned as a hemorrhagic cyst.    The instruments were removed from the abdomen and pelvis and the port sites irrigated. The umbilical fascia was closed with an interrupted 0 Vicryl  suture. The subcutaneous tissue of the umbilical left upper quadrant and right lower quadrant subcutaneous tissues were approximated with a single suture. Skin incisions were closed with a subcuticular suture.  The vaginal vault was cleared with a moist sponge stick.  Sponge, lap and needle counts were correct x 3.    The patient had sequential compression devices          Disposition: PACU -  hemodynamically stable.         Condition:stable Foley draining clear urine.

## 2014-06-26 NOTE — Transfer of Care (Signed)
Immediate Anesthesia Transfer of Care Note  Patient: Sheryl Gonzalez  Procedure(s) Performed: Procedure(s) (LRB): ROBOTIC ASSISTED BILATERAL SALPINGECTOMY, RIGHT OOPHORECTOMY (Bilateral)  Patient Location: PACU  Anesthesia Type: General  Level of Consciousness: sedated, patient cooperative and responds to stimulation  Airway & Oxygen Therapy: Patient Spontanous Breathing and Patient connected to face mask oxgen  Post-op Assessment: Report given to PACU RN and Post -op Vital signs reviewed and stable  Post vital signs: Reviewed and stable  Complications: No apparent anesthesia complications

## 2014-06-26 NOTE — Discharge Instructions (Signed)
Unilateral Salpingo-Oophorectomy, Care After Refer to this sheet in the next few weeks. These instructions provide you with information on caring for yourself after your procedure. Your health care provider may also give you more specific instructions. Your treatment has been planned according to current medical practices, but problems sometimes occur. Call your health care provider if you have any problems or questions after your procedure. WHAT TO EXPECT AFTER THE PROCEDURE After your procedure, it is typical to have the following:  Abdominal pain that can be controlled with pain medicine.  Vaginal spotting.  Constipation. HOME CARE INSTRUCTIONS   Get plenty of rest and sleep.  Only take over-the-counter or prescription medicines as directed by your health care provider. Do not take aspirin. It can cause bleeding.  Keep incision areas clean and dry. Remove or change any bandages (dressings) only as directed by your health care provider.  Follow your health care provider's advice regarding diet.  Drink enough fluids to keep your urine clear or pale yellow.  Limit exercise and activities as directed by your health care provider. Do not lift anything heavier than 5 pounds (2.3 kg) until your health care provider approves.  Do not drive until your health care provider approves.  Do not drink alcohol until your health care provider approves.  Do not have sexual intercourse until your health care provider says it is OK.  Take your temperature twice a day and write it down.  If you become constipated, you may:  Ask your health care provider about taking a mild laxative.  Add more fruit and bran to your diet.  Drink more fluids.  Follow up with your health care provider as directed. SEEK MEDICAL CARE IF:   You have swelling or redness in the incision area.  You develop a rash.  You feel lightheaded.  You have pain that is not controlled with medicine.  You have pain,  swelling, or redness where the IV access tube was placed. SEEK IMMEDIATE MEDICAL CARE IF:  You have a fever.  You develop increasing abdominal pain.  You see pus coming out of the incision, or the incision is separating.  You notice a bad smell coming from the wound or dressing.  You have excessive vaginal bleeding.  You feel sick to your stomach (nauseous) and vomit.  You have leg or chest pain.  You have pain when you urinate.  You develop shortness of breath.  You pass out. Document Released: 04/25/2009 Document Revised: 04/19/2013 Document Reviewed: 12/21/2012 Exit[employer] Patient Information 2015 [employer]. This information is not intended to replace advice given to you by your health care provider. Make sure you discuss any questions you have with your health care provider.  

## 2014-06-26 NOTE — Anesthesia Postprocedure Evaluation (Signed)
  Anesthesia Post-op Note  Patient: Sheryl Gonzalez  Procedure(s) Performed: Procedure(s) (LRB): ROBOTIC ASSISTED BILATERAL SALPINGECTOMY, RIGHT OOPHORECTOMY (Bilateral)  Patient Location: PACU  Anesthesia Type: General  Level of Consciousness: awake and alert   Airway and Oxygen Therapy: Patient Spontanous Breathing  Post-op Pain: mild  Post-op Assessment: Post-op Vital signs reviewed, Patient's Cardiovascular Status Stable, Respiratory Function Stable, Patent Airway and No signs of Nausea or vomiting  Last Vitals:  Filed Vitals:   06/26/14 1327  BP: 129/67  Pulse:   Temp:   Resp: 14    Post-op Vital Signs: stable   Complications: No apparent anesthesia complications

## 2014-06-27 ENCOUNTER — Encounter (HOSPITAL_COMMUNITY): Payer: Self-pay | Admitting: Gynecologic Oncology

## 2014-07-05 ENCOUNTER — Telehealth: Payer: Self-pay | Admitting: *Deleted

## 2014-07-05 NOTE — Telephone Encounter (Signed)
Per Joylene John, NP patient called with final pathology. Let her know that it did not show any cancer. Patient very appreciative of call. She said she is doing fine since surgery - has only had to take ibuprofen and denies any issues with her bladder or bowels. Confirmed patient's followup appt for 07/16/14. Told patient if she has any problems before then to please give [name] a call - patient agreeable to this.

## 2014-07-16 ENCOUNTER — Ambulatory Visit: Payer: BLUE CROSS/BLUE SHIELD | Attending: Gynecologic Oncology | Admitting: Gynecologic Oncology

## 2014-07-16 ENCOUNTER — Encounter: Payer: Self-pay | Admitting: Gynecologic Oncology

## 2014-07-16 VITALS — BP 127/75 | Temp 98.2°F | Resp 16 | Ht 65.5 in | Wt 147.3 lb

## 2014-07-16 DIAGNOSIS — N951 Menopausal and female climacteric states: Secondary | ICD-10-CM

## 2014-07-16 DIAGNOSIS — N83209 Unspecified ovarian cyst, unspecified side: Secondary | ICD-10-CM

## 2014-07-16 DIAGNOSIS — N801 Endometriosis of ovary: Secondary | ICD-10-CM

## 2014-07-16 NOTE — Patient Instructions (Signed)
Follow-up with your GYN as scheduled. Please call us in the future with any concerns or issues.

## 2014-07-16 NOTE — Progress Notes (Signed)
  HPI:  Sheryl Gonzalez is a 49 y.o. year old initially seen in consultation on 05/24/14 for a right adnexal mass and elevated CA 125.  She then underwent a robotic assisted right salpingo-oophorectomy and left salpingectomy on 38/25/05 without complications.  Her postoperative course was uncomplicated.  Her final pathology revealed right ovarian endometrioma with no malignancy.  She is seen today for a postoperative check and to discuss her pathology results and ongoing plan.  Since discharge from the hospital, she is feeling well overall, though postop she has had more frequent and more severe hot flashes (particularly nocturnal).  She has improving appetite, normal bowel and bladder function, and pain controlled with minimal PO medication. She has no other complaints today.    Review of systems: Constitutional:  She has no weight gain or weight loss. She has no fever or chills. Eyes: No blurred vision Ears, Nose, Mouth, Throat: No dizziness, headaches or changes in hearing. No mouth sores. Cardiovascular: No chest pain, palpitations or edema. Respiratory:  No shortness of breath, wheezing or cough Gastrointestinal: She has normal bowel movements without diarrhea or constipation. She denies any nausea or vomiting. She denies blood in her stool or heart burn. Genitourinary:  She denies pelvic pain, pelvic pressure or changes in her urinary function. She has no hematuria, dysuria, or incontinence. She has no irregular vaginal bleeding or vaginal discharge Musculoskeletal: Denies muscle weakness or joint pains.  Skin:  She has no skin changes, rashes or itching Neurological:  Denies dizziness or headaches. No neuropathy, no numbness or tingling. Psychiatric:  She denies depression or anxiety. Hematologic/Lymphatic:   No easy bruising or bleeding   Physical Exam: Blood pressure 127/75, temperature 98.2 F (36.8 C), temperature source Oral, resp. rate 16, height 5' 5.5" (1.664 m), weight 147  lb 4.8 oz (66.815 kg), last menstrual period 06/10/2014. General: Well dressed, well nourished in no apparent distress.   HEENT:  Normocephalic and atraumatic, no lesions.  Extraocular muscles intact. Sclerae anicteric. Pupils equal, round, reactive. No mouth sores or ulcers. Thyroid is normal size, not nodular, midline. Skin:  No lesions or rashes. Breasts:  deferred Lungs:  Normal work of breathing Cardiovascular:  Well perfused extremities Abdomen:  Soft, nontender, nondistended.  No palpable masses.  No hepatosplenomegaly.  No ascites. Normal bowel sounds.  No hernias.  Incisions are well healed Genitourinary: deferred Extremities: No cyanosis, clubbing or edema.  No calf tenderness or erythema. No palpable cords. Psychiatric: Mood and affect are appropriate. Neurological: Awake, alert and oriented x 3. Sensation is intact, no neuropathy.  Musculoskeletal: No pain, normal strength and range of motion.  Assessment:    49 y.o. year old with a history of a right endometrioma.   S/p robotic RSO, left salpingectomy on 06/26/14.   Plan: 1) Pathology reports reviewed today 2) Treatment counseling - I discussed that she still has a uterus and cervix and left ovary and requires annual examinations per guidelines. Pap smears can continue per guidelines (q 3 years without HPV co-testing or q 5 years with normal HPV cotesting). She was given the opportunity to ask questions, which were answered to her satisfaction, and she is agreement with the above mentioned plan of care. She is released to return to work on Thursday without restriction. We discussed options for menopausal symptom relief, but given that she does not have QOL limiting symptoms, she would like to avoid medical estrogen replacement at present. 3)  Return to clinic prn Donaciano Eva, MD

## 2014-08-19 ENCOUNTER — Encounter: Payer: Self-pay | Admitting: Gynecologic Oncology

## 2015-10-25 ENCOUNTER — Encounter: Payer: Self-pay | Admitting: Family Medicine

## 2015-10-28 ENCOUNTER — Ambulatory Visit (INDEPENDENT_AMBULATORY_CARE_PROVIDER_SITE_OTHER): Payer: BLUE CROSS/BLUE SHIELD | Admitting: Family Medicine

## 2015-10-28 ENCOUNTER — Encounter: Payer: Self-pay | Admitting: Family Medicine

## 2015-10-28 VITALS — BP 118/90 | HR 72 | Temp 98.0°F | Resp 14 | Ht 65.5 in | Wt 160.0 lb

## 2015-10-28 DIAGNOSIS — Z0000 Encounter for general adult medical examination without abnormal findings: Secondary | ICD-10-CM

## 2015-10-28 DIAGNOSIS — F320 Major depressive disorder, single episode, mild: Secondary | ICD-10-CM | POA: Diagnosis not present

## 2015-10-28 LAB — LIPID PANEL
Cholesterol: 202 mg/dL — ABNORMAL HIGH (ref 125–200)
HDL: 72 mg/dL
LDL Cholesterol: 119 mg/dL
Total CHOL/HDL Ratio: 2.8 ratio
Triglycerides: 54 mg/dL
VLDL: 11 mg/dL

## 2015-10-28 LAB — CBC WITH DIFFERENTIAL/PLATELET
BASOS PCT: 1 %
Basophils Absolute: 30 cells/uL (ref 0–200)
EOS ABS: 240 {cells}/uL (ref 15–500)
Eosinophils Relative: 8 %
HEMATOCRIT: 38 % (ref 35.0–45.0)
HEMOGLOBIN: 11.2 g/dL — AB (ref 12.0–15.0)
Lymphocytes Relative: 56 %
Lymphs Abs: 1680 cells/uL (ref 850–3900)
MCH: 21.3 pg — ABNORMAL LOW (ref 27.0–33.0)
MCHC: 29.5 g/dL — AB (ref 32.0–36.0)
MCV: 72.4 fL — ABNORMAL LOW (ref 80.0–100.0)
MONO ABS: 240 {cells}/uL (ref 200–950)
MPV: 9.3 fL (ref 7.5–12.5)
Monocytes Relative: 8 %
NEUTROS PCT: 27 %
Neutro Abs: 810 cells/uL — ABNORMAL LOW (ref 1500–7800)
Platelets: 293 10*3/uL (ref 140–400)
RBC: 5.25 MIL/uL — ABNORMAL HIGH (ref 3.80–5.10)
RDW: 18.2 % — ABNORMAL HIGH (ref 11.0–15.0)
WBC: 3 10*3/uL — AB (ref 3.8–10.8)

## 2015-10-28 LAB — COMPLETE METABOLIC PANEL WITH GFR
ALBUMIN: 4.2 g/dL (ref 3.6–5.1)
ALK PHOS: 43 U/L (ref 33–130)
ALT: 9 U/L (ref 6–29)
AST: 18 U/L (ref 10–35)
BUN: 10 mg/dL (ref 7–25)
CALCIUM: 9.1 mg/dL (ref 8.6–10.4)
CO2: 24 mmol/L (ref 20–31)
Chloride: 105 mmol/L (ref 98–110)
Creat: 0.88 mg/dL (ref 0.50–1.05)
GFR, Est African American: 87 mL/min (ref >=60)
GFR, Est Non African American: 76 mL/min (ref >=60)
GLUCOSE: 91 mg/dL (ref 70–99)
POTASSIUM: 4.2 mmol/L (ref 3.5–5.3)
Sodium: 140 mmol/L (ref 135–146)
Total Bilirubin: 0.4 mg/dL (ref 0.2–1.2)
Total Protein: 6.8 g/dL (ref 6.1–8.1)

## 2015-10-28 MED ORDER — VENLAFAXINE HCL ER 75 MG PO CP24: 150.0000 mg | ORAL_CAPSULE | Freq: Every day | ORAL | Status: DC

## 2015-10-28 NOTE — Progress Notes (Signed)
Subjective:    Patient ID: Sheryl Gonzalez, female    DOB: 21-Sep-1965, 49 y.o.   MRN: [id number]  HPI The patient is a very pleasant 50 year old female here today to establish care. Last colonoscopy was in 2002/11/06 and significant for colon polyps. She is due again. She sees a gynecologist performs her Pap smear and her mammogram. Tetanus shot is up-to-date. Past medical history significant for right salpingo-oophorectomy and left fallopian tube removal.  Still has her left ovary as well as her uterus. She also has a history of depression after her mother died in 11-05-09 from presumed cervical cancer. She does not believe that the Celexa 40 mg a day that she is taking is helping as much as it once did and is interested in switching to something that may work better Past Medical History  Diagnosis Date  . Anxiety   . Seasonal allergies   . Wears glasses   . Menorrhagia   . Iron deficiency anemia due to chronic blood loss   . Colon polyps    Past Surgical History  Procedure Laterality Date  . Breast biopsy  Nov 06, 2007    lt  . Colonoscopy    . Breast biopsy Left 2012/11/05    Procedure: LEFT NEEDLE LOCALIZATION EXCISIONAL BREAST BIOPSY;  Surgeon: Stark Klein, MD;  Location: [facility];  Service: General;  Laterality: Left;  . Breast ductal system excision Left 11/05/12    Procedure: MAJOR DUCTAL EXCISION;  Surgeon: Stark Klein, MD;  Location: [facility];  Service: General;  Laterality: Left;  . Robotic assisted bilateral salpingo oopherectomy Bilateral 06/26/2014    Procedure: ROBOTIC ASSISTED BILATERAL SALPINGECTOMY, RIGHT OOPHORECTOMY;  Surgeon: [name], MD;  Location: [facility];  Service: Gynecology;  Laterality: Bilateral;   Current Outpatient Prescriptions on File Prior to Visit  Medication Sig Dispense Refill  . Biotin 1000 MCG tablet Take 2,000 mcg by mouth every morning.    . Cholecalciferol (VITAMIN D-3) 1000 UNITS CAPS Take 1 capsule by mouth every  morning.     . citalopram (CELEXA) 10 MG tablet Take 10 mg by mouth at bedtime.    . cyanocobalamin 500 MCG tablet Take 500 mcg by mouth every morning.     [name] ibuprofen (ADVIL,MOTRIN) 200 MG tablet Take 200 mg by mouth every 6 (six) hours as needed for moderate pain.    . Lactobacillus (PROBIOTIC ACIDOPHILUS PO) Take 1 tablet by mouth every morning.     . Multiple Vitamins-Minerals (ADULT GUMMY) CHEW Chew 2 tablets by mouth every morning.     . polyvinyl alcohol (LIQUIFILM TEARS) 1.4 % ophthalmic solution Place 2 drops into both eyes daily as needed for dry eyes.    . vitamin E 400 UNIT capsule Take 400 Units by mouth every morning.     . ferrous sulfate 325 (65 FE) MG tablet Take 325 mg by mouth daily with breakfast. Reported on 10/28/2015    . fluticasone (FLONASE) 50 MCG/ACT nasal spray Place 2 sprays into the nose daily. (Patient not taking: Reported on 07/16/2014) 16 g 2   No current facility-administered medications on file prior to visit.   No Known Allergies Social History   Social History  . Marital Status: Single    Spouse Name: N/A  . Number of Children: N/A  . Years of Education: N/A   Occupational History  . Not on file.   Social History Main Topics  . Smoking status: Never Smoker   . Smokeless tobacco: Never Used  .  Alcohol Use: No  . Drug Use: No  . Sexual Activity: No   Other Topics Concern  . Not on file   Social History Narrative   Family History  Problem Relation Age of Onset  . Arthritis Other   . Uterine cancer      parent  . Kidney disease Other   . Diabetes Other   . Cancer Mother     ? Cervical/Ovarian  . Arthritis Mother   . Diabetes Mother   . Arthritis Sister   . Diabetes Sister   . Kidney disease Sister     transplant   . Heart disease Maternal Uncle   . Kidney disease Maternal Uncle   . Kidney disease Cousin       Review of Systems  All other systems reviewed and are negative.      Objective:   Physical Exam  Constitutional:  She is oriented to person, place, and time. She appears well-developed and well-nourished. No distress.  HENT:  Head: Normocephalic and atraumatic.  Right Ear: External ear normal.  Left Ear: External ear normal.  Nose: Nose normal.  Mouth/Throat: Oropharynx is clear and moist. No oropharyngeal exudate.  Eyes: Conjunctivae and EOM are normal. Pupils are equal, round, and reactive to light. Right eye exhibits no discharge. Left eye exhibits no discharge. No scleral icterus.  Neck: Normal range of motion. Neck supple. No JVD present. No tracheal deviation present. No thyromegaly present.  Cardiovascular: Normal rate, regular rhythm, normal heart sounds and intact distal pulses.  Exam reveals no gallop and no friction rub.   No murmur heard. Pulmonary/Chest: Effort normal and breath sounds normal. No stridor. No respiratory distress. She has no wheezes. She has no rales. She exhibits no tenderness.  Abdominal: Soft. Bowel sounds are normal. She exhibits no distension and no mass. There is no tenderness. There is no rebound and no guarding.  Musculoskeletal: Normal range of motion. She exhibits no edema or tenderness.  Lymphadenopathy:    She has no cervical adenopathy.  Neurological: She is alert and oriented to person, place, and time. She has normal reflexes. She displays normal reflexes. No cranial nerve deficit. She exhibits normal muscle tone. Coordination normal.  Skin: Skin is warm. No rash noted. She is not diaphoretic. No erythema. No pallor.  Psychiatric: She has a normal mood and affect. Her behavior is normal. Judgment and thought content normal.  Vitals reviewed.         Assessment & Plan:  Routine general medical examination at a health care facility - Plan: CBC with Differential/Platelet, COMPLETE METABOLIC PANEL WITH GFR, Lipid panel  Major depressive disorder, single episode, mild (HCC) - Plan: venlafaxine XR (EFFEXOR XR) 75 MG 24 hr capsule  Discontinue Celexa and begin  Effexor XR 75 mg by mouth every morning. Increase to 150 mg in 2 weeks. Pressures borderline. I've asked the patient check her blood pressure daily and notify me of the values in several weeks. If consistently greater than 140/90, I will add medication to control. I will check a CBC, CMP, and fasting lipid panel. Immunizations are up-to-date. Mammogram and Pap smear up-to-date. I'll schedule the patient for colonoscopy. She declines hepatitis C screening

## 2015-10-29 ENCOUNTER — Encounter: Payer: Self-pay | Admitting: Family Medicine

## 2015-11-01 ENCOUNTER — Encounter: Payer: Self-pay | Admitting: Gastroenterology

## 2015-12-06 ENCOUNTER — Ambulatory Visit (AMBULATORY_SURGERY_CENTER): Payer: Self-pay

## 2015-12-06 VITALS — Ht 65.5 in | Wt 162.2 lb

## 2015-12-06 DIAGNOSIS — Z1211 Encounter for screening for malignant neoplasm of colon: Secondary | ICD-10-CM

## 2015-12-06 MED ORDER — SUPREP BOWEL PREP KIT 17.5-3.13-1.6 GM/177ML PO SOLN: 1.0000 | Freq: Once | ORAL | Status: DC

## 2015-12-06 NOTE — Progress Notes (Signed)
No allergies to eggs or soy No diet meds No home oxygen No past problems with anesthesia  Difficult iv start; please utilize Edmon Crape or CRNA

## 2015-12-10 ENCOUNTER — Encounter: Payer: Self-pay | Admitting: Gastroenterology

## 2015-12-20 ENCOUNTER — Ambulatory Visit (AMBULATORY_SURGERY_CENTER): Payer: BLUE CROSS/BLUE SHIELD | Admitting: Gastroenterology

## 2015-12-20 ENCOUNTER — Encounter: Payer: Self-pay | Admitting: Gastroenterology

## 2015-12-20 VITALS — BP 113/71 | HR 72 | Temp 98.2°F | Resp 17 | Ht 65.5 in | Wt 162.0 lb

## 2015-12-20 DIAGNOSIS — Z1211 Encounter for screening for malignant neoplasm of colon: Secondary | ICD-10-CM | POA: Diagnosis not present

## 2015-12-20 MED ORDER — SODIUM CHLORIDE 0.9 % IV SOLN: 500.0000 mL | INTRAVENOUS | Status: DC

## 2015-12-20 NOTE — Progress Notes (Signed)
Report to PACU, RN, vss, BBS= Clear.  

## 2015-12-20 NOTE — Op Note (Signed)
[facility] Patient Name: Sheryl Gonzalez Procedure Date: 12/20/2015 8:04 AM MRN: [id number] Endoscopist: Mauri Pole , MD Age: 50 Referring MD:  Date of Birth: 1966-01-30 Gender: Female Procedure:                Colonoscopy Indications:              Screening for malignant neoplasm in the colon Medicines:                Monitored Anesthesia Care Procedure:                Pre-Anesthesia Assessment:                           - Prior to the procedure, a History and Physical                            was performed, and patient medications and                            allergies were reviewed. The patient's tolerance of                            previous anesthesia was also reviewed. The risks                            and benefits of the procedure and the sedation                            options and risks were discussed with the patient.                            All questions were answered, and informed consent                            was obtained. Prior Anticoagulants: The patient has                            taken no previous anticoagulant or antiplatelet                            agents. ASA Grade Assessment: II - A patient with                            mild systemic disease. After reviewing the risks                            and benefits, the patient was deemed in                            satisfactory condition to undergo the procedure.                           After obtaining informed consent, the colonoscope  was passed under direct vision. Throughout the                            procedure, the patient's blood pressure, pulse, and                            oxygen saturations were monitored continuously. The                            Model CF-HQ190L [id number]) scope was introduced                            through the anus and advanced to the the terminal                            ileum, with identification  of the appendiceal                            orifice and IC valve. The colonoscopy was performed                            without difficulty. The patient tolerated the                            procedure well. The quality of the bowel                            preparation was excellent. The terminal ileum,                            ileocecal valve, appendiceal orifice, and rectum                            were photographed. Scope In: 8:12:41 AM Scope Out: 8:25:30 AM Scope Withdrawal Time: 0 hours 7 minutes 51 seconds  Total Procedure Duration: 0 hours 12 minutes 49 seconds  Findings:                 The perianal and digital rectal examinations were                            normal.                           The colon (entire examined portion) appeared normal. Complications:            No immediate complications. Estimated Blood Loss:     Estimated blood loss: none. Impression:               - The entire examined colon is normal.                           - No specimens collected. Recommendation:           - Patient has a contact number available for  emergencies. The signs and symptoms of potential                            delayed complications were discussed with the                            patient. Return to normal activities tomorrow.                            Written discharge instructions were provided to the                            patient.                           - Resume previous diet.                           - Continue present medications.                           - Repeat colonoscopy in 10 years for screening                            purposes.                           - Return to GI clinic PRN. Mauri Pole, MD 12/20/2015 8:32:48 AM This report has been signed electronically.

## 2015-12-20 NOTE — Patient Instructions (Signed)
YOU HAD AN ENDOSCOPIC PROCEDURE TODAY AT THE [facility]:   Refer to the procedure report that was given to you for any specific questions about what was found during the examination.  If the procedure report does not answer your questions, please call your gastroenterologist to clarify.  If you requested that your care partner not be given the details of your procedure findings, then the procedure report has been included in a sealed envelope for you to review at your convenience later.  YOU SHOULD EXPECT: Some feelings of bloating in the abdomen. Passage of more gas than usual.  Walking can help get rid of the air that was put into your GI tract during the procedure and reduce the bloating. If you had a lower endoscopy (such as a colonoscopy or flexible sigmoidoscopy) you may notice spotting of blood in your stool or on the toilet paper. If you underwent a bowel prep for your procedure, you may not have a normal bowel movement for a few days.  Please Note:  You might notice some irritation and congestion in your nose or some drainage.  This is from the oxygen used during your procedure.  There is no need for concern and it should clear up in a day or so.  SYMPTOMS TO REPORT IMMEDIATELY:   Following lower endoscopy (colonoscopy or flexible sigmoidoscopy):  Excessive amounts of blood in the stool  Significant tenderness or worsening of abdominal pains  Swelling of the abdomen that is new, acute  Fever of 100F or higher   For urgent or emergent issues, a gastroenterologist can be reached at any hour by calling [phone].   DIET: Your first meal following the procedure should be a small meal and then it is ok to progress to your normal diet. Heavy or fried foods are harder to digest and may make you feel nauseous or bloated.  Likewise, meals heavy in dairy and vegetables can increase bloating.  Drink plenty of fluids but you should avoid alcoholic beverages for 24  hours.  ACTIVITY:  You should plan to take it easy for the rest of today and you should NOT DRIVE or use heavy machinery until tomorrow (because of the sedation medicines used during the test).    FOLLOW UP: Our staff will call the number listed on your records the next business day following your procedure to check on you and address any questions or concerns that you may have regarding the information given to you following your procedure. If we do not reach you, we will leave a message.  However, if you are feeling well and you are not experiencing any problems, there is no need to return our call.  We will assume that you have returned to your regular daily activities without incident.  If any biopsies were taken you will be contacted by phone or by letter within the next 1-3 weeks.  Please call us at [phone] if you have not heard about the biopsies in 3 weeks.    SIGNATURES/CONFIDENTIALITY: You and/or your care partner have signed paperwork which will be entered into your electronic medical record.  These signatures attest to the fact that that the information above on your After Visit Summary has been reviewed and is understood.  Full responsibility of the confidentiality of this discharge information lies with you and/or your care-partner. 

## 2015-12-23 ENCOUNTER — Telehealth: Payer: Self-pay | Admitting: *Deleted

## 2015-12-23 NOTE — Telephone Encounter (Signed)
  Follow up Call-  Call back number 12/20/2015  Post procedure Call Back phone  # [phone]  Permission to leave phone message Yes     Patient questions:  Do you have a fever, pain , or abdominal swelling? No. Pain Score  0 *  Have you tolerated food without any problems? Yes.    Have you been able to return to your normal activities? Yes.    Do you have any questions about your discharge instructions: Diet   No. Medications  No. Follow up visit  No.  Do you have questions or concerns about your Care? No.  Actions: * If pain score is 4 or above: No action needed, pain <4.

## 2016-05-07 DIAGNOSIS — Z6826 Body mass index (BMI) 26.0-26.9, adult: Secondary | ICD-10-CM | POA: Diagnosis not present

## 2016-05-07 DIAGNOSIS — Z1231 Encounter for screening mammogram for malignant neoplasm of breast: Secondary | ICD-10-CM | POA: Diagnosis not present

## 2016-05-07 DIAGNOSIS — Z01419 Encounter for gynecological examination (general) (routine) without abnormal findings: Secondary | ICD-10-CM | POA: Diagnosis not present

## 2016-05-15 ENCOUNTER — Other Ambulatory Visit: Payer: Self-pay | Admitting: Family Medicine

## 2016-05-15 DIAGNOSIS — F320 Major depressive disorder, single episode, mild: Secondary | ICD-10-CM

## 2016-09-17 ENCOUNTER — Other Ambulatory Visit: Payer: Self-pay | Admitting: Family Medicine

## 2016-09-17 DIAGNOSIS — F320 Major depressive disorder, single episode, mild: Secondary | ICD-10-CM

## 2016-10-20 ENCOUNTER — Encounter: Payer: Self-pay | Admitting: Family Medicine

## 2016-10-20 ENCOUNTER — Ambulatory Visit (INDEPENDENT_AMBULATORY_CARE_PROVIDER_SITE_OTHER): Payer: BLUE CROSS/BLUE SHIELD | Admitting: Family Medicine

## 2016-10-20 VITALS — BP 102/74 | HR 100 | Temp 97.8°F | Resp 14 | Wt 166.0 lb

## 2016-10-20 DIAGNOSIS — K529 Noninfective gastroenteritis and colitis, unspecified: Secondary | ICD-10-CM

## 2016-10-20 DIAGNOSIS — F320 Major depressive disorder, single episode, mild: Secondary | ICD-10-CM

## 2016-10-20 MED ORDER — VENLAFAXINE HCL ER 75 MG PO CP24: ORAL_CAPSULE | ORAL | 11 refills | Status: DC

## 2016-10-20 NOTE — Progress Notes (Signed)
Subjective:    Patient ID: Sheryl Gonzalez, female    DOB: 06/05/1966, 51 y.o.   MRN: [id number]  HPI The patient Is here today requesting a refill on venlafaxine. Medication seems to be working well to control her depression and anxiety. She has no side effects on the medication and would like to continue it. She is due for fasting lab work. Of note over the weekend, the patient developed nausea and vomiting on 2 separate occasions. She feels much better today. She denies any diarrhea or fever. She denies any abdominal pain. She denies any dysuria or hematuria Past Medical History:  Diagnosis Date  . Anxiety   . Colon polyps   . Iron deficiency anemia due to chronic blood loss   . Menorrhagia   . Seasonal allergies   . Wears glasses    Past Surgical History:  Procedure Laterality Date  . BREAST BIOPSY  2009   lt  . BREAST BIOPSY Left 10/12/2012   Procedure: LEFT NEEDLE LOCALIZATION EXCISIONAL BREAST BIOPSY;  Surgeon: Stark Klein, MD;  Location: [facility];  Service: General;  Laterality: Left;  . BREAST DUCTAL SYSTEM EXCISION Left 10/12/2012   Procedure: MAJOR DUCTAL EXCISION;  Surgeon: Stark Klein, MD;  Location: [facility];  Service: General;  Laterality: Left;  . COLONOSCOPY    . ROBOTIC ASSISTED BILATERAL SALPINGO OOPHERECTOMY Bilateral 06/26/2014   Procedure: ROBOTIC ASSISTED BILATERAL SALPINGECTOMY, RIGHT OOPHORECTOMY;  Surgeon: [name], MD;  Location: [facility];  Service: Gynecology;  Laterality: Bilateral;   Current Outpatient Prescriptions on File Prior to Visit  Medication Sig Dispense Refill  . Biotin 1000 MCG tablet Take 5,000 mcg by mouth every morning.     [name] BLACK COHOSH PO Take by mouth.    . cyanocobalamin 500 MCG tablet Take 500 mcg by mouth every morning.     . Lactobacillus (PROBIOTIC ACIDOPHILUS PO) Take 1 tablet by mouth every morning.     . polyvinyl alcohol (LIQUIFILM TEARS) 1.4 % ophthalmic solution Place 2 drops into  both eyes daily as needed for dry eyes.    . Cholecalciferol (VITAMIN D-3) 1000 UNITS CAPS Take 2 capsules by mouth every morning. 2000     No current facility-administered medications on file prior to visit.    No Known Allergies Social History   Social History  . Marital status: Single    Spouse name: N/A  . Number of children: N/A  . Years of education: N/A   Occupational History  . Not on file.   Social History Main Topics  . Smoking status: Never Smoker  . Smokeless tobacco: Never Used  . Alcohol use No  . Drug use: No  . Sexual activity: No   Other Topics Concern  . Not on file   Social History Narrative  . No narrative on file   Family History  Problem Relation Age of Onset  . Arthritis Other   . Uterine cancer      parent  . Kidney disease Other   . Diabetes Other   . Cancer Mother     ? Cervical/Ovarian  . Arthritis Mother   . Diabetes Mother   . Arthritis Sister   . Diabetes Sister   . Kidney disease Sister     transplant   . Heart disease Maternal Uncle   . Kidney disease Maternal Uncle   . Kidney disease Cousin   . Colon cancer Neg Hx       Review of Systems  Gastrointestinal: Positive for vomiting.  All other systems reviewed and are negative.      Objective:   Physical Exam  Constitutional: She is oriented to person, place, and time. She appears well-developed and well-nourished. No distress.  HENT:  Head: Normocephalic and atraumatic.  Right Ear: External ear normal.  Left Ear: External ear normal.  Nose: Nose normal.  Mouth/Throat: Oropharynx is clear and moist. No oropharyngeal exudate.  Eyes: Conjunctivae and EOM are normal. Pupils are equal, round, and reactive to light. Right eye exhibits no discharge. Left eye exhibits no discharge. No scleral icterus.  Neck: Normal range of motion. Neck supple. No JVD present. No tracheal deviation present. No thyromegaly present.  Cardiovascular: Normal rate, regular rhythm, normal heart  sounds and intact distal pulses.  Exam reveals no gallop and no friction rub.   No murmur heard. Pulmonary/Chest: Effort normal and breath sounds normal. No stridor. No respiratory distress. She has no wheezes. She has no rales. She exhibits no tenderness.  Abdominal: Soft. Bowel sounds are normal. She exhibits no distension and no mass. There is no tenderness. There is no rebound and no guarding.  Musculoskeletal: Normal range of motion. She exhibits no edema or tenderness.  Lymphadenopathy:    She has no cervical adenopathy.  Neurological: She is alert and oriented to person, place, and time. She has normal reflexes. No cranial nerve deficit. She exhibits normal muscle tone. Coordination normal.  Skin: Skin is warm. No rash noted. She is not diaphoretic. No erythema. No pallor.  Psychiatric: She has a normal mood and affect. Her behavior is normal. Judgment and thought content normal.  Vitals reviewed.         Assessment & Plan:  Gastroenteritis  Major depressive disorder, single episode, mild (HCC) - Plan: venlafaxine XR (EFFEXOR-XR) 75 MG 24 hr capsule  Depression is well controlled on venlafaxine. Patient is tolerating the medication well with no side effects and would like to continue it. Continue Effexor XR 150 mg by mouth every morning. Exam is completely normal. I suspect the patient has a mild viral gastroenteritis. She is already improving and therefore there is no indication for Phenergan or Zofran. Patient will eat a brat diet and I anticipate his symptoms should gradually improve over the next 4872 hours. Recheck immediately if symptoms worsen or she develops fever or bloody diarrhea.  Meanwhile check a CBC, CMP, and a fasting lipid panel

## 2016-10-23 ENCOUNTER — Other Ambulatory Visit: Payer: Self-pay | Admitting: Family Medicine

## 2016-10-23 DIAGNOSIS — F339 Major depressive disorder, recurrent, unspecified: Secondary | ICD-10-CM

## 2016-10-23 DIAGNOSIS — D649 Anemia, unspecified: Secondary | ICD-10-CM

## 2016-10-23 DIAGNOSIS — Z79899 Other long term (current) drug therapy: Secondary | ICD-10-CM

## 2016-10-23 DIAGNOSIS — E785 Hyperlipidemia, unspecified: Secondary | ICD-10-CM

## 2016-10-26 ENCOUNTER — Other Ambulatory Visit: Payer: BLUE CROSS/BLUE SHIELD

## 2016-10-26 DIAGNOSIS — F339 Major depressive disorder, recurrent, unspecified: Secondary | ICD-10-CM | POA: Diagnosis not present

## 2016-10-26 DIAGNOSIS — Z79899 Other long term (current) drug therapy: Secondary | ICD-10-CM | POA: Diagnosis not present

## 2016-10-26 DIAGNOSIS — D649 Anemia, unspecified: Secondary | ICD-10-CM

## 2016-10-26 DIAGNOSIS — E785 Hyperlipidemia, unspecified: Secondary | ICD-10-CM | POA: Diagnosis not present

## 2016-10-26 LAB — CBC WITH DIFFERENTIAL/PLATELET
Basophils Absolute: 0 cells/uL (ref 0–200)
Basophils Relative: 0 %
EOS PCT: 0 %
Eosinophils Absolute: 0 cells/uL — ABNORMAL LOW (ref 15–500)
HEMATOCRIT: 38.9 % (ref 35.0–45.0)
Hemoglobin: 12.3 g/dL (ref 12.0–15.0)
LYMPHS PCT: 45 %
Lymphs Abs: 1440 cells/uL (ref 850–3900)
MCH: 25.8 pg — ABNORMAL LOW (ref 27.0–33.0)
MCHC: 31.6 g/dL — AB (ref 32.0–36.0)
MCV: 81.6 fL (ref 80.0–100.0)
MONO ABS: 352 {cells}/uL (ref 200–950)
MPV: 9.2 fL (ref 7.5–12.5)
Monocytes Relative: 11 %
NEUTROS PCT: 44 %
Neutro Abs: 1408 cells/uL — ABNORMAL LOW (ref 1500–7800)
Platelets: 248 10*3/uL (ref 140–400)
RBC: 4.77 MIL/uL (ref 3.80–5.10)
RDW: 14.7 % (ref 11.0–15.0)
WBC: 3.2 10*3/uL — AB (ref 3.8–10.8)

## 2016-10-26 LAB — LIPID PANEL
CHOL/HDL RATIO: 2.8 ratio (ref <5.0)
Cholesterol: 175 mg/dL (ref <200)
HDL: 62 mg/dL (ref >50)
LDL CALC: 103 mg/dL — AB (ref <100)
Triglycerides: 52 mg/dL (ref <150)
VLDL: 10 mg/dL (ref <30)

## 2016-10-26 LAB — COMPLETE METABOLIC PANEL WITH GFR
ALT: 10 U/L (ref 6–29)
AST: 18 U/L (ref 10–35)
Albumin: 4 g/dL (ref 3.6–5.1)
Alkaline Phosphatase: 47 U/L (ref 33–130)
BUN: 15 mg/dL (ref 7–25)
CO2: 26 mmol/L (ref 20–31)
Calcium: 9.1 mg/dL (ref 8.6–10.4)
Chloride: 106 mmol/L (ref 98–110)
Creat: 0.77 mg/dL (ref 0.50–1.05)
GFR, EST NON AFRICAN AMERICAN: 88 mL/min (ref >=60)
GFR, Est African American: >89 mL/min (ref >=60)
GLUCOSE: 91 mg/dL (ref 70–99)
Potassium: 4.3 mmol/L (ref 3.5–5.3)
SODIUM: 142 mmol/L (ref 135–146)
Total Bilirubin: 0.3 mg/dL (ref 0.2–1.2)
Total Protein: 6.4 g/dL (ref 6.1–8.1)

## 2016-10-26 LAB — TSH: TSH: 1.78 mIU/L

## 2016-10-27 ENCOUNTER — Encounter: Payer: Self-pay | Admitting: Family Medicine

## 2017-05-31 DIAGNOSIS — Z124 Encounter for screening for malignant neoplasm of cervix: Secondary | ICD-10-CM | POA: Diagnosis not present

## 2017-05-31 DIAGNOSIS — Z01419 Encounter for gynecological examination (general) (routine) without abnormal findings: Secondary | ICD-10-CM | POA: Diagnosis not present

## 2017-05-31 DIAGNOSIS — Z1231 Encounter for screening mammogram for malignant neoplasm of breast: Secondary | ICD-10-CM | POA: Diagnosis not present

## 2017-05-31 DIAGNOSIS — Z6828 Body mass index (BMI) 28.0-28.9, adult: Secondary | ICD-10-CM | POA: Diagnosis not present

## 2017-11-11 ENCOUNTER — Other Ambulatory Visit: Payer: Self-pay | Admitting: Family Medicine

## 2017-11-11 DIAGNOSIS — F320 Major depressive disorder, single episode, mild: Secondary | ICD-10-CM

## 2017-12-01 ENCOUNTER — Other Ambulatory Visit: Payer: Self-pay | Admitting: Family Medicine

## 2017-12-01 DIAGNOSIS — F320 Major depressive disorder, single episode, mild: Secondary | ICD-10-CM

## 2018-01-12 ENCOUNTER — Other Ambulatory Visit: Payer: Self-pay | Admitting: Family Medicine

## 2018-01-12 DIAGNOSIS — F320 Major depressive disorder, single episode, mild: Secondary | ICD-10-CM

## 2018-02-14 ENCOUNTER — Other Ambulatory Visit: Payer: Self-pay | Admitting: Family Medicine

## 2018-02-14 DIAGNOSIS — F320 Major depressive disorder, single episode, mild: Secondary | ICD-10-CM

## 2018-03-16 ENCOUNTER — Other Ambulatory Visit: Payer: Self-pay | Admitting: Family Medicine

## 2018-03-16 DIAGNOSIS — F320 Major depressive disorder, single episode, mild: Secondary | ICD-10-CM

## 2018-04-13 ENCOUNTER — Other Ambulatory Visit: Payer: Self-pay | Admitting: Family Medicine

## 2018-04-13 DIAGNOSIS — F320 Major depressive disorder, single episode, mild: Secondary | ICD-10-CM

## 2018-05-09 ENCOUNTER — Other Ambulatory Visit: Payer: Self-pay | Admitting: Family Medicine

## 2018-05-09 DIAGNOSIS — F320 Major depressive disorder, single episode, mild: Secondary | ICD-10-CM

## 2018-06-02 DIAGNOSIS — Z6828 Body mass index (BMI) 28.0-28.9, adult: Secondary | ICD-10-CM | POA: Diagnosis not present

## 2018-06-02 DIAGNOSIS — Z01419 Encounter for gynecological examination (general) (routine) without abnormal findings: Secondary | ICD-10-CM | POA: Diagnosis not present

## 2018-06-02 DIAGNOSIS — Z131 Encounter for screening for diabetes mellitus: Secondary | ICD-10-CM | POA: Diagnosis not present

## 2018-06-02 DIAGNOSIS — R7301 Impaired fasting glucose: Secondary | ICD-10-CM | POA: Diagnosis not present

## 2018-06-02 DIAGNOSIS — Z1329 Encounter for screening for other suspected endocrine disorder: Secondary | ICD-10-CM | POA: Diagnosis not present

## 2018-06-02 DIAGNOSIS — E785 Hyperlipidemia, unspecified: Secondary | ICD-10-CM | POA: Diagnosis not present

## 2018-06-02 DIAGNOSIS — Z1231 Encounter for screening mammogram for malignant neoplasm of breast: Secondary | ICD-10-CM | POA: Diagnosis not present

## 2018-06-02 DIAGNOSIS — Z13220 Encounter for screening for lipoid disorders: Secondary | ICD-10-CM | POA: Diagnosis not present

## 2018-06-02 DIAGNOSIS — Z1321 Encounter for screening for nutritional disorder: Secondary | ICD-10-CM | POA: Diagnosis not present

## 2019-02-24 ENCOUNTER — Encounter: Payer: Self-pay | Admitting: Family Medicine

## 2019-03-14 ENCOUNTER — Ambulatory Visit: Payer: 59 | Admitting: Family Medicine

## 2019-03-14 ENCOUNTER — Encounter: Payer: Self-pay | Admitting: Family Medicine

## 2019-03-14 ENCOUNTER — Other Ambulatory Visit: Payer: Self-pay

## 2019-03-14 VITALS — BP 130/94 | HR 82 | Temp 98.8°F | Resp 14 | Ht 65.0 in | Wt 180.0 lb

## 2019-03-14 DIAGNOSIS — R6889 Other general symptoms and signs: Secondary | ICD-10-CM | POA: Diagnosis not present

## 2019-03-14 DIAGNOSIS — R432 Parageusia: Secondary | ICD-10-CM

## 2019-03-14 MED ORDER — ONDANSETRON HCL 4 MG PO TABS: 4.0000 mg | ORAL_TABLET | Freq: Three times a day (TID) | ORAL | 0 refills | Status: DC | PRN

## 2019-03-14 MED ORDER — VENLAFAXINE HCL ER 37.5 MG PO CP24: 37.5000 mg | ORAL_CAPSULE | Freq: Every day | ORAL | 1 refills | Status: DC

## 2019-03-14 MED ORDER — AMOXICILLIN-POT CLAVULANATE 875-125 MG PO TABS: 1.0000 | ORAL_TABLET | Freq: Two times a day (BID) | ORAL | 0 refills | Status: DC

## 2019-03-14 NOTE — Progress Notes (Signed)
Subjective:    Patient ID: Sheryl Gonzalez, female    DOB: [birth year], 53 y.o.   MRN: [id number]  HPI  10/2015 The patient is a very pleasant 53 year old female here today to establish care. Last colonoscopy was in October 31, 2002 and significant for colon polyps. She is due again. She sees a gynecologist performs her Pap smear and her mammogram. Tetanus shot is up-to-date. Past medical history significant for right salpingo-oophorectomy and left fallopian tube removal.  Still has her left ovary as well as her uterus. She also has a history of depression after her mother died in October 30, 2009 from presumed cervical cancer. She does not believe that the Celexa 40 mg a day that she is taking is helping as much as it once did and is interested in switching to something that may work better.  At that time, my plan was: Discontinue Celexa and begin Effexor XR 75 mg by mouth every morning. Increase to 150 mg in 2 weeks. Pressures borderline. I've asked the patient check her blood pressure daily and notify me of the values in several weeks. If consistently greater than 140/90, I will add medication to control. I will check a CBC, CMP, and fasting lipid panel. Immunizations are up-to-date. Mammogram and Pap smear up-to-date. I'll schedule the patient for colonoscopy. She declines hepatitis C screening  03/14/19 Patient has been on Effexor XR for the last 3 years.  She is doing much better and weaned herself down to 75 mg a day.  Over the last 2 to 3 months, she is tried to wean herself off the medication.  She took 1 tablet every other day and then 1 tablet every third day and then quit the medication over a period of 2 months.  However when she stopped the medication she developed headaches, dizziness, nausea, and fatigue.  She would like assistance in helping to wean off the medication.  She also complains of sinus pain in both maxillary sinuses.  This is been present now for approximately 1 month.  She also reports metallic  dysgeusia that has been present for approximately 3 months. Past Medical History:  Diagnosis Date  . Anxiety   . Colon polyps   . Iron deficiency anemia due to chronic blood loss   . Menorrhagia   . Seasonal allergies   . Wears glasses    Past Surgical History:  Procedure Laterality Date  . BREAST BIOPSY  10-31-2007   lt  . BREAST BIOPSY Left 10/12/2012   Procedure: LEFT NEEDLE LOCALIZATION EXCISIONAL BREAST BIOPSY;  Surgeon: Stark Klein, MD;  Location: [facility];  Service: General;  Laterality: Left;  . BREAST DUCTAL SYSTEM EXCISION Left 10/12/2012   Procedure: MAJOR DUCTAL EXCISION;  Surgeon: Stark Klein, MD;  Location: [facility];  Service: General;  Laterality: Left;  . COLONOSCOPY    . ROBOTIC ASSISTED BILATERAL SALPINGO OOPHERECTOMY Bilateral 06/26/2014   Procedure: ROBOTIC ASSISTED BILATERAL SALPINGECTOMY, RIGHT OOPHORECTOMY;  Surgeon: [name], MD;  Location: [facility];  Service: Gynecology;  Laterality: Bilateral;   Current Outpatient Medications on File Prior to Visit  Medication Sig Dispense Refill  . Biotin 1000 MCG tablet Take 5,000 mcg by mouth every morning.     [name] BLACK COHOSH PO Take by mouth.    . Cholecalciferol (VITAMIN D-3) 1000 UNITS CAPS Take 2 capsules by mouth every morning. October 31, 1998    . cyanocobalamin 500 MCG tablet Take 500 mcg by mouth every morning.     . Lactobacillus (PROBIOTIC ACIDOPHILUS  PO) Take 1 tablet by mouth every morning.     . polyvinyl alcohol (LIQUIFILM TEARS) 1.4 % ophthalmic solution Place 2 drops into both eyes daily as needed for dry eyes.    [name] venlafaxine XR (EFFEXOR-XR) 75 MG 24 hr capsule TAKE 2 CAPSULES BY MOUTH ONCE DAILY WITHBREAKFAST 60 capsule 0   No current facility-administered medications on file prior to visit.    No Known Allergies Social History   Socioeconomic History  . Marital status: Single    Spouse name: Not on file  . Number of children: Not on file  . Years of education: Not on file  .  Highest education level: Not on file  Occupational History  . Not on file  Social Needs  . Financial resource strain: Not on file  . Food insecurity    Worry: Not on file    Inability: Not on file  . Transportation needs    Medical: Not on file    Non-medical: Not on file  Tobacco Use  . Smoking status: Never Smoker  . Smokeless tobacco: Never Used  Substance and Sexual Activity  . Alcohol use: No    Alcohol/week: 0.0 standard drinks  . Drug use: No  . Sexual activity: Never  Lifestyle  . Physical activity    Days per week: Not on file    Minutes per session: Not on file  . Stress: Not on file  Relationships  . Social Herbalist on phone: Not on file    Gets together: Not on file    Attends religious service: Not on file    Active member of club or organization: Not on file    Attends meetings of clubs or organizations: Not on file    Relationship status: Not on file  . Intimate partner violence    Fear of current or ex partner: Not on file    Emotionally abused: Not on file    Physically abused: Not on file    Forced sexual activity: Not on file  Other Topics Concern  . Not on file  Social History Narrative  . Not on file   Family History  Problem Relation Age of Onset  . Arthritis Other   . Uterine cancer Unknown        parent  . Kidney disease Other   . Diabetes Other   . Cancer Mother        ? Cervical/Ovarian  . Arthritis Mother   . Diabetes Mother   . Arthritis Sister   . Diabetes Sister   . Kidney disease Sister        transplant   . Heart disease Maternal Uncle   . Kidney disease Maternal Uncle   . Kidney disease Cousin   . Colon cancer Neg Hx       Review of Systems  All other systems reviewed and are negative.      Objective:   Physical Exam  Constitutional: She is oriented to person, place, and time. She appears well-developed and well-nourished. No distress.  HENT:  Head: Normocephalic and atraumatic.  Right Ear:  External ear normal.  Left Ear: External ear normal.  Nose: Nose normal.  Mouth/Throat: Oropharynx is clear and moist. No oropharyngeal exudate.  Eyes: Pupils are equal, round, and reactive to light. Conjunctivae and EOM are normal. Right eye exhibits no discharge. Left eye exhibits no discharge. No scleral icterus.  Neck: Normal range of motion. Neck supple. No JVD present. No tracheal deviation  present. No thyromegaly present.  Cardiovascular: Normal rate, regular rhythm, normal heart sounds and intact distal pulses. Exam reveals no gallop and no friction rub.  No murmur heard. Pulmonary/Chest: Effort normal and breath sounds normal. No stridor. No respiratory distress. She has no wheezes. She has no rales. She exhibits no tenderness.  Abdominal: Soft. Bowel sounds are normal. She exhibits no distension and no mass. There is no abdominal tenderness. There is no rebound and no guarding.  Musculoskeletal: Normal range of motion.        General: No tenderness or edema.  Lymphadenopathy:    She has no cervical adenopathy.  Neurological: She is alert and oriented to person, place, and time. She has normal reflexes. No cranial nerve deficit. She exhibits normal muscle tone. Coordination normal.  Skin: Skin is warm. No rash noted. She is not diaphoretic. No erythema. No pallor.  Psychiatric: She has a normal mood and affect. Her behavior is normal. Judgment and thought content normal.  Vitals reviewed.         Assessment & Plan:  Dysgeusia - Plan: amoxicillin-clavulanate (AUGMENTIN) 875-125 MG tablet  Symptom of drug withdrawal  I have recommended that the patient begin venlafaxine extended release 37.5 mg p.o. daily.  I recommended that she take this medication every day for 4 weeks to adjust to the lower dose and then switch to every other day for 4 weeks and then every third day for 4 weeks and then stop the medication.  I believe this prolonged taper will help her minimize any withdrawal  symptoms.  I am concerned that the pain and pressure she is experiencing in her maxillary sinuses and the metallic taste in her mouth could be due to chronic sinusitis.  I will try treating this with Augmentin 875 mg p.o. twice daily for 10 days.

## 2019-10-24 ENCOUNTER — Other Ambulatory Visit: Payer: Self-pay

## 2019-10-24 ENCOUNTER — Ambulatory Visit: Payer: 59 | Admitting: Nurse Practitioner

## 2019-10-24 VITALS — BP 132/90 | HR 73 | Temp 97.9°F | Resp 18 | Ht 65.0 in | Wt 183.6 lb

## 2019-10-24 DIAGNOSIS — M25552 Pain in left hip: Secondary | ICD-10-CM | POA: Diagnosis not present

## 2019-10-24 NOTE — Progress Notes (Signed)
Established Patient Office Visit  Subjective:  Patient ID: Sheryl Gonzalez, female    DOB: [birth year]  Age: 54 y.o. MRN: [id number]  CC:  Chief Complaint  Patient presents with  . Hip Pain    L side, x2 months, no injury but does play softball for last 2 weeks, ibuprofen was taken    HPI Sheryl Gonzalez is a 54 year old female presenting for left hip pain for 1 month. She notice the pain at bedtime the night after she started playing soft ball and it has continued since. She has taken 200mg  Ibuprofen once or twice without relief of sxs. She does not have pain today. Her pain exacerbates with lying in bed on the left side. The hip is painful and stiff upon rising in AM but resolved within 30 minutes. The pt does not have a know h/o osteoarthritis that she is aware of. She does not recall an injury to the hip.   Past Medical History:  Diagnosis Date  . Anxiety   . Colon polyps   . Iron deficiency anemia due to chronic blood loss   . Menorrhagia   . Seasonal allergies   . Wears glasses     Past Surgical History:  Procedure Laterality Date  . BREAST BIOPSY  2009   lt  . BREAST BIOPSY Left 10/12/2012   Procedure: LEFT NEEDLE LOCALIZATION EXCISIONAL BREAST BIOPSY;  Surgeon: Sheryl Klein, MD;  Location: [place];  Service: General;  Laterality: Left;  . BREAST DUCTAL SYSTEM EXCISION Left 10/12/2012   Procedure: MAJOR DUCTAL EXCISION;  Surgeon: Sheryl Klein, MD;  Location: [place];  Service: General;  Laterality: Left;  . COLONOSCOPY    . ROBOTIC ASSISTED BILATERAL SALPINGO OOPHERECTOMY Bilateral 06/26/2014   Procedure: ROBOTIC ASSISTED BILATERAL SALPINGECTOMY, RIGHT OOPHORECTOMY;  Surgeon: Sheryl Morning, MD;  Location: [facility];  Service: Gynecology;  Laterality: Bilateral;    Family History  Problem Relation Age of Onset  . Arthritis Other   . Uterine cancer Unknown        parent  . Kidney disease Other   . Diabetes Other   . Cancer  Mother        ? Cervical/Ovarian  . Arthritis Mother   . Diabetes Mother   . Arthritis Sister   . Diabetes Sister   . Kidney disease Sister        transplant   . Heart disease Maternal Uncle   . Kidney disease Maternal Uncle   . Kidney disease Cousin   . Colon cancer Neg Hx     Social History   Socioeconomic History  . Marital status: Single    Spouse name: Not on file  . Number of children: Not on file  . Years of education: Not on file  . Highest education level: Not on file  Occupational History  . Not on file  Tobacco Use  . Smoking status: Never Smoker  . Smokeless tobacco: Never Used  Substance and Sexual Activity  . Alcohol use: No    Alcohol/week: 0.0 standard drinks  . Drug use: No  . Sexual activity: Never  Other Topics Concern  . Not on file  Social History Narrative  . Not on file   Social Determinants of Health   Financial Resource Strain:   . Difficulty of Paying Living Expenses:   Food Insecurity:   . Worried About Charity fundraiser in the Last Year:   . Arboriculturist in  the Last Year:   Transportation Needs:   . Film/video editor (Medical):   Sheryl Gonzalez Lack of Transportation (Non-Medical):   Physical Activity:   . Days of Exercise per Week:   . Minutes of Exercise per Session:   Stress:   . Feeling of Stress :   Social Connections:   . Frequency of Communication with Friends and Family:   . Frequency of Social Gatherings with Friends and Family:   . Attends Religious Services:   . Active Member of Clubs or Organizations:   . Attends Archivist Meetings:   Sheryl Gonzalez Marital Status:   Intimate Partner Violence:   . Fear of Current or Ex-Partner:   . Emotionally Abused:   Sheryl Gonzalez Physically Abused:   . Sexually Abused:     Outpatient Medications Prior to Visit  Medication Sig Dispense Refill  . Biotin 1000 MCG tablet Take 5,000 mcg by mouth every Gonzalez.     Sheryl Gonzalez BLACK COHOSH PO Take by mouth.    . Cholecalciferol (VITAMIN D-3) 1000 UNITS  CAPS Take 2 capsules by mouth every Gonzalez. 2000    . cyanocobalamin 500 MCG tablet Take 500 mcg by mouth every Gonzalez.     . Lactobacillus (PROBIOTIC ACIDOPHILUS PO) Take 1 tablet by mouth every Gonzalez.     Sheryl Gonzalez amoxicillin-clavulanate (AUGMENTIN) 875-125 MG tablet Take 1 tablet by mouth 2 (two) times daily. 20 tablet 0  . ondansetron (ZOFRAN) 4 MG tablet Take 1 tablet (4 mg total) by mouth every 8 (eight) hours as needed for nausea or vomiting. 20 tablet 0  . polyvinyl alcohol (LIQUIFILM TEARS) 1.4 % ophthalmic solution Place 2 drops into both eyes daily as needed for dry eyes.    Sheryl Gonzalez venlafaxine XR (EFFEXOR XR) 37.5 MG 24 hr capsule Take 1 capsule (37.5 mg total) by mouth daily with breakfast. 30 capsule 1   No facility-administered medications prior to visit.    No Known Allergies  ROS Review of Systems  All other systems reviewed and are negative.     Objective:    Physical Exam  Constitutional: She is oriented to person, place, and time. She appears well-developed and well-nourished. She is cooperative.  HENT:  Head: Normocephalic.  Right Ear: Hearing normal.  Left Ear: Hearing normal.  Eyes: Pupils are equal, round, and reactive to light. Conjunctivae, EOM and lids are normal. Lids are everted and swept, no foreign bodies found.  Cardiovascular: Normal rate.  Pulmonary/Chest: Effort normal.  Musculoskeletal:     Cervical back: Neck supple.     Left hip: Normal.  Neurological: She is alert and oriented to person, place, and time.  Skin: Skin is warm and dry.  Psychiatric: She has a normal mood and affect. Her speech is normal and behavior is normal. Judgment and thought content normal. Cognition and memory are normal.  Nursing note and vitals reviewed.   BP 132/90 (BP Location: Left Arm, Patient Position: Sitting, Cuff Size: Normal)   Pulse 73   Temp 97.9 F (36.6 C) (Temporal)   Resp 18   Ht 5\' 5"  (1.651 m)   Wt 183 lb 9.6 oz (83.3 kg)   SpO2 99%   BMI 30.55 kg/m    Wt Readings from Last 3 Encounters:  10/24/19 183 lb 9.6 oz (83.3 kg)  03/14/19 180 lb (81.6 kg)  10/20/16 166 lb (75.3 kg)     Health Maintenance Due  Topic Date Due  . Hepatitis C Screening  Never done  . HIV Screening  Never  done  . MAMMOGRAM  09/01/2014  . PAP SMEAR-Modifier  01/12/2016    There are no preventive care reminders to display for this patient.  Lab Results  Component Value Date   TSH 1.78 10/26/2016   Lab Results  Component Value Date   WBC 3.2 (L) 10/26/2016   HGB 12.3 10/26/2016   HCT 38.9 10/26/2016   MCV 81.6 10/26/2016   PLT 248 10/26/2016   Lab Results  Component Value Date   NA 142 10/26/2016   K 4.3 10/26/2016   CO2 26 10/26/2016   GLUCOSE 91 10/26/2016   BUN 15 10/26/2016   CREATININE 0.77 10/26/2016   BILITOT 0.3 10/26/2016   ALKPHOS 47 10/26/2016   AST 18 10/26/2016   ALT 10 10/26/2016   PROT 6.4 10/26/2016   ALBUMIN 4.0 10/26/2016   CALCIUM 9.1 10/26/2016   ANIONGAP 11 06/21/2014   GFR 71.50 03/30/2013   Lab Results  Component Value Date   CHOL 175 10/26/2016   Lab Results  Component Value Date   HDL 62 10/26/2016   Lab Results  Component Value Date   LDLCALC 103 (H) 10/26/2016   Lab Results  Component Value Date   TRIG 52 10/26/2016   Lab Results  Component Value Date   CHOLHDL 2.8 10/26/2016   Lab Results  Component Value Date   HGBA1C 5.5 03/30/2013      Assessment & Plan:   Problem List Items Addressed This Visit    None    Visit Diagnoses    Left hip pain    -  Primary   Relevant Orders   DG Arthro Hip Left    May take Ibuprofen 600 mg every 9 hours on full stomach or with Pepcid 20 mg over the counter.  Xray of hip to evaluate for osteoarthritis vs acute injury.  Rest, ice, heat.  Seek medical attention for non resolving or worsening symptoms   Follow-up: Return if symptoms worsen or fail to improve.    Annie Main, FNP

## 2019-10-24 NOTE — Patient Instructions (Signed)
May take Ibuprofen 600 mg every 9 hours on full stomach or with Pepcid 20 mg over the counter.  Xray of hip.  Rest, ice, heat.  Seek medical attention for non resolving or worsening symptoms

## 2019-10-25 ENCOUNTER — Other Ambulatory Visit: Payer: Self-pay | Admitting: Family Medicine

## 2019-10-25 DIAGNOSIS — M25552 Pain in left hip: Secondary | ICD-10-CM

## 2020-09-25 DIAGNOSIS — E7800 Pure hypercholesterolemia, unspecified: Secondary | ICD-10-CM | POA: Diagnosis not present

## 2020-09-25 DIAGNOSIS — Z131 Encounter for screening for diabetes mellitus: Secondary | ICD-10-CM | POA: Diagnosis not present

## 2020-09-25 DIAGNOSIS — Z6829 Body mass index (BMI) 29.0-29.9, adult: Secondary | ICD-10-CM | POA: Diagnosis not present

## 2020-09-25 DIAGNOSIS — Z01419 Encounter for gynecological examination (general) (routine) without abnormal findings: Secondary | ICD-10-CM | POA: Diagnosis not present

## 2020-09-25 DIAGNOSIS — Z1231 Encounter for screening mammogram for malignant neoplasm of breast: Secondary | ICD-10-CM | POA: Diagnosis not present

## 2020-09-25 DIAGNOSIS — Z1329 Encounter for screening for other suspected endocrine disorder: Secondary | ICD-10-CM | POA: Diagnosis not present

## 2020-09-25 LAB — HM MAMMOGRAPHY

## 2020-09-26 LAB — LAB REPORT - SCANNED
A1c: 5.5
TSH: 4.04 (ref 0.41–5.90)

## 2020-09-28 LAB — HM PAP SMEAR: HPV, high-risk: NEGATIVE

## 2020-10-08 DIAGNOSIS — Z13820 Encounter for screening for osteoporosis: Secondary | ICD-10-CM | POA: Diagnosis not present

## 2020-11-23 DIAGNOSIS — R42 Dizziness and giddiness: Secondary | ICD-10-CM | POA: Insufficient documentation

## 2020-11-28 ENCOUNTER — Inpatient Hospital Stay (HOSPITAL_COMMUNITY): Payer: BC Managed Care – PPO

## 2020-11-28 ENCOUNTER — Other Ambulatory Visit: Payer: Self-pay

## 2020-11-28 ENCOUNTER — Ambulatory Visit: Payer: BC Managed Care – PPO | Admitting: Family Medicine

## 2020-11-28 ENCOUNTER — Encounter (HOSPITAL_COMMUNITY): Payer: Self-pay

## 2020-11-28 ENCOUNTER — Inpatient Hospital Stay (HOSPITAL_COMMUNITY)
Admission: EM | Admit: 2020-11-28 | Discharge: 2020-11-30 | DRG: 227 | Disposition: A | Discharge status: Home / Self Care | Payer: BC Managed Care – PPO | Attending: Cardiology | Admitting: Cardiology

## 2020-11-28 VITALS — BP 130/74 | HR 42 | Temp 98.1°F | Resp 14 | Ht 65.0 in | Wt 182.0 lb

## 2020-11-28 DIAGNOSIS — R55 Syncope and collapse: Secondary | ICD-10-CM | POA: Diagnosis not present

## 2020-11-28 DIAGNOSIS — R079 Chest pain, unspecified: Secondary | ICD-10-CM | POA: Diagnosis not present

## 2020-11-28 DIAGNOSIS — Z8049 Family history of malignant neoplasm of other genital organs: Secondary | ICD-10-CM

## 2020-11-28 DIAGNOSIS — Z8249 Family history of ischemic heart disease and other diseases of the circulatory system: Secondary | ICD-10-CM

## 2020-11-28 DIAGNOSIS — Z841 Family history of disorders of kidney and ureter: Secondary | ICD-10-CM

## 2020-11-28 DIAGNOSIS — Z833 Family history of diabetes mellitus: Secondary | ICD-10-CM

## 2020-11-28 DIAGNOSIS — I441 Atrioventricular block, second degree: Secondary | ICD-10-CM

## 2020-11-28 DIAGNOSIS — I499 Cardiac arrhythmia, unspecified: Secondary | ICD-10-CM | POA: Diagnosis not present

## 2020-11-28 DIAGNOSIS — Z8261 Family history of arthritis: Secondary | ICD-10-CM | POA: Diagnosis not present

## 2020-11-28 DIAGNOSIS — R001 Bradycardia, unspecified: Secondary | ICD-10-CM

## 2020-11-28 DIAGNOSIS — I442 Atrioventricular block, complete: Secondary | ICD-10-CM | POA: Diagnosis not present

## 2020-11-28 DIAGNOSIS — Z20822 Contact with and (suspected) exposure to covid-19: Secondary | ICD-10-CM | POA: Diagnosis present

## 2020-11-28 DIAGNOSIS — Z95810 Presence of automatic (implantable) cardiac defibrillator: Secondary | ICD-10-CM

## 2020-11-28 DIAGNOSIS — R0602 Shortness of breath: Secondary | ICD-10-CM | POA: Diagnosis not present

## 2020-11-28 DIAGNOSIS — D869 Sarcoidosis, unspecified: Secondary | ICD-10-CM | POA: Diagnosis not present

## 2020-11-28 LAB — CBC
HCT: 38.9 % (ref 36.0–46.0)
Hemoglobin: 12.2 g/dL (ref 12.0–15.0)
MCH: 26.2 pg (ref 26.0–34.0)
MCH: 26.2 pg (ref 26.0–34.0)
MCHC: 31.4 g/dL (ref 30.0–36.0)
MCHC: 31.3 g/dL (ref 30.0–36.0)
MCV: 83.7 fL (ref 80.0–100.0)
Platelets: 224 10*3/uL (ref 150–400)
Platelets: 246 10*3/uL (ref 150–400)
RBC: 4.65 MIL/uL (ref 3.87–5.11)
RDW: 13.4 % (ref 11.5–15.5)
RDW: 13.4 % (ref 11.5–15.5)
WBC: 5.4 10*3/uL (ref 4.0–10.5)
nRBC: 0 % (ref 0.0–0.2)

## 2020-11-28 LAB — URINALYSIS, ROUTINE W REFLEX MICROSCOPIC
Bilirubin Urine: NEGATIVE
Glucose, UA: NEGATIVE mg/dL
Hgb urine dipstick: NEGATIVE
Ketones, ur: NEGATIVE mg/dL
Nitrite: NEGATIVE
Protein, ur: NEGATIVE mg/dL
Specific Gravity, Urine: 1.019 (ref 1.005–1.030)
pH: 5 (ref 5.0–8.0)

## 2020-11-28 LAB — TROPONIN I (HIGH SENSITIVITY): Troponin I (High Sensitivity): 8 ng/L (ref <18)

## 2020-11-28 LAB — BASIC METABOLIC PANEL
Anion gap: 7 (ref 5–15)
BUN: 14 mg/dL (ref 6–20)
CO2: 25 mmol/L (ref 22–32)
Calcium: 8.9 mg/dL (ref 8.9–10.3)
Chloride: 110 mmol/L (ref 98–111)
Creatinine, Ser: 1.13 mg/dL — ABNORMAL HIGH (ref 0.44–1.00)
GFR, Estimated: 57 mL/min — ABNORMAL LOW (ref >60)
Glucose, Bld: 94 mg/dL (ref 70–99)
Potassium: 4.1 mmol/L (ref 3.5–5.1)
Sodium: 142 mmol/L (ref 135–145)

## 2020-11-28 LAB — HIV ANTIBODY (ROUTINE TESTING W REFLEX): HIV Screen 4th Generation wRfx: NONREACTIVE

## 2020-11-28 LAB — SARS CORONAVIRUS 2 (TAT 6-24 HRS): SARS Coronavirus 2: NEGATIVE

## 2020-11-28 LAB — CBG MONITORING, ED: Glucose-Capillary: 75 mg/dL (ref 70–99)

## 2020-11-28 LAB — TSH: TSH: 1.857 u[IU]/mL (ref 0.350–4.500)

## 2020-11-28 LAB — MAGNESIUM: Magnesium: 2 mg/dL (ref 1.7–2.4)

## 2020-11-28 LAB — CREATININE, SERUM: GFR, Estimated: 59 mL/min — ABNORMAL LOW (ref >60)

## 2020-11-28 MED ORDER — BIOTIN 1000 MCG PO TABS: 5000.0000 ug | ORAL_TABLET | Freq: Every morning | ORAL | Status: DC

## 2020-11-28 MED ORDER — ENOXAPARIN SODIUM 40 MG/0.4ML IJ SOSY: 40.0000 mg | PREFILLED_SYRINGE | INTRAMUSCULAR | Status: DC

## 2020-11-28 MED ORDER — VITAMIN B-12 100 MCG PO TABS
500.0000 ug | ORAL_TABLET | Freq: Every morning | ORAL | Status: DC
  Administered 2020-11-29 – 2020-11-30 (×2): 500 ug via ORAL
  Filled 2020-11-28 (×2): qty 5

## 2020-11-28 MED ORDER — VITAMIN D 25 MCG (1000 UNIT) PO TABS
2000.0000 [IU] | ORAL_TABLET | Freq: Every morning | ORAL | Status: DC
  Administered 2020-11-29 – 2020-11-30 (×2): 2000 [IU] via ORAL
  Filled 2020-11-28 (×3): qty 2

## 2020-11-28 NOTE — Progress Notes (Signed)
Subjective:    Patient ID: Sheryl Gonzalez, female    DOB: 1966/06/16, 55 y.o.   MRN: [id number]  HPI   Symptoms began 16-Nov-2022.  Patient has been unconvinced most recently was a death in family.  Starting Nov 16, 2022 she developed shortness of breath with activity, lightheadedness with activity, dizziness, and fatigue.  Today in the office, patient is also endorsing tightness in the chest with activity although she feels fine at rest.  Patient's heart rate is 38 bpm with rest.  EKG shows type II second-degree AV block with gradually progressing PR interval.  Patient denies any syncope.  She does not feel lightheaded at the present time.  She denies any chest pain at present time.  There are T wave inversions in several leads suggesting ischemia. Past Medical History:  Diagnosis Date  . Anxiety   . Colon polyps   . Iron deficiency anemia due to chronic blood loss   . Menorrhagia   . Seasonal allergies   . Wears glasses    Past Surgical History:  Procedure Laterality Date  . BREAST BIOPSY  2009   lt  . BREAST BIOPSY Left 10/12/2012   Procedure: LEFT NEEDLE LOCALIZATION EXCISIONAL BREAST BIOPSY;  Surgeon: Stark Klein, MD;  Location: [place];  Service: General;  Laterality: Left;  . BREAST DUCTAL SYSTEM EXCISION Left 10/12/2012   Procedure: MAJOR DUCTAL EXCISION;  Surgeon: Stark Klein, MD;  Location: [facility];  Service: General;  Laterality: Left;  . COLONOSCOPY    . ROBOTIC ASSISTED BILATERAL SALPINGO OOPHERECTOMY Bilateral 06/26/2014   Procedure: ROBOTIC ASSISTED BILATERAL SALPINGECTOMY, RIGHT OOPHORECTOMY;  Surgeon: [name], MD;  Location: [facility];  Service: Gynecology;  Laterality: Bilateral;   Current Outpatient Medications on File Prior to Visit  Medication Sig Dispense Refill  . Biotin 1000 MCG tablet Take 5,000 mcg by mouth every morning.     [name] BLACK COHOSH PO Take by mouth.    . Cholecalciferol (VITAMIN D-3) 1000 UNITS CAPS Take 2  capsules by mouth every morning. 2000    . cyanocobalamin 500 MCG tablet Take 500 mcg by mouth every morning.      No current facility-administered medications on file prior to visit.   No Known Allergies Social History   Socioeconomic History  . Marital status: Single    Spouse name: Not on file  . Number of children: Not on file  . Years of education: Not on file  . Highest education level: Not on file  Occupational History  . Not on file  Tobacco Use  . Smoking status: Never Smoker  . Smokeless tobacco: Never Used  Substance and Sexual Activity  . Alcohol use: No    Alcohol/week: 0.0 standard drinks  . Drug use: No  . Sexual activity: Never  Other Topics Concern  . Not on file  Social History Narrative  . Not on file   Social Determinants of Health   Financial Resource Strain: Not on file  Food Insecurity: Not on file  Transportation Needs: Not on file  Physical Activity: Not on file  Stress: Not on file  Social Connections: Not on file  Intimate Partner Violence: Not on file   Family History  Problem Relation Age of Onset  . Arthritis Other   . Uterine cancer Other        parent  . Kidney disease Other   . Diabetes Other   . Cancer Mother        ? Cervical/Ovarian  .  Arthritis Mother   . Diabetes Mother   . Arthritis Sister   . Diabetes Sister   . Kidney disease Sister        transplant   . Heart disease Maternal Uncle   . Kidney disease Maternal Uncle   . Kidney disease Cousin   . Colon cancer Neg Hx       Review of Systems  All other systems reviewed and are negative.      Objective:   Physical Exam Vitals reviewed.  Constitutional:      General: She is not in acute distress.    Appearance: She is well-developed. She is not diaphoretic.  HENT:     Head: Normocephalic and atraumatic.     Right Ear: External ear normal.     Left Ear: External ear normal.     Nose: Nose normal.     Mouth/Throat:     Pharynx: No oropharyngeal exudate.   Eyes:     General: No scleral icterus.       Right eye: No discharge.        Left eye: No discharge.     Conjunctiva/sclera: Conjunctivae normal.     Pupils: Pupils are equal, round, and reactive to light.  Neck:     Thyroid: No thyromegaly.     Vascular: No JVD.     Trachea: No tracheal deviation.  Cardiovascular:     Rate and Rhythm: Regular rhythm. Bradycardia present.     Heart sounds: Normal heart sounds. No murmur heard. No friction rub. No gallop.   Pulmonary:     Effort: Pulmonary effort is normal. No respiratory distress.     Breath sounds: Normal breath sounds. No stridor. No wheezing or rales.  Chest:     Chest wall: No tenderness.  Abdominal:     General: Bowel sounds are normal. There is no distension.     Palpations: Abdomen is soft. There is no mass.     Tenderness: There is no abdominal tenderness. There is no guarding or rebound.  Musculoskeletal:        General: No tenderness. Normal range of motion.     Cervical back: Normal range of motion and neck supple.  Lymphadenopathy:     Cervical: No cervical adenopathy.  Skin:    General: Skin is warm.     Coloration: Skin is not pale.     Findings: No erythema or rash.  Neurological:     Mental Status: She is alert and oriented to person, place, and time.     Cranial Nerves: No cranial nerve deficit.     Motor: No abnormal muscle tone.     Coordination: Coordination normal.     Deep Tendon Reflexes: Reflexes are normal and symmetric.  Psychiatric:        Behavior: Behavior normal.        Thought Content: Thought content normal.        Judgment: Judgment normal.           Assessment & Plan:  Bradycardia - Plan: EKG 12-Lead  Second degree AV block, Mobitz type II  Patient has new onset second-degree AV block type II.  She is symptomatic with shortness of breath with activity and lightheadedness with activity.  This requires emergent evaluation in the ER.  911 was called doxycycline via EMS transport  to the emergency room for further evaluation.

## 2020-11-28 NOTE — ED Notes (Signed)
ED Provider at bedside. 

## 2020-11-28 NOTE — ED Provider Notes (Signed)
[facility] EMERGENCY DEPARTMENT Provider Note   CSN: [id number] Arrival date & time: 11/28/20  1533     History Chief Complaint  Patient presents with  . Near Syncope    Sheryl Gonzalez is a 55 y.o. female.  55 yo F with a chief complaints of feeling lightheaded.  This is been off and on over the past week.  Seems to get worse on exertion and better with rest.  Has had some mild chest discomfort with it as well.  Saw her family doctor today and was noted to be significantly bradycardic and there was some concern for Mobitz type II and so she was sent to the ED for cardiology evaluation for possible pacemaker placement.  Patient denies any symptoms at rest.  Denies any lower extremity edema denies cough congestion or fever.  Denies nausea vomiting or diarrhea.  Denies recent medication changes.  The history is provided by the patient.  Near Syncope This is a new problem. The current episode started more than 1 week ago. The problem occurs daily. The problem has not changed since onset.Associated symptoms include chest pain (pressure on exertion). Pertinent negatives include no headaches and no shortness of breath. The symptoms are aggravated by bending, twisting and exertion. The symptoms are relieved by rest. She has tried nothing for the symptoms. The treatment provided no relief.       Past Medical History:  Diagnosis Date  . Anxiety   . Colon polyps   . Iron deficiency anemia due to chronic blood loss   . Menorrhagia   . Seasonal allergies   . Wears glasses     Patient Active Problem List   Diagnosis Date Noted  . Menopausal symptoms 07/16/2014  . Ovarian mass, right 05/24/2014  . Nipple discharge, bloody 08/22/2012  . Allergic rhinitis 05/31/2012  . DEPRESSION 05/30/2008  . ACUTE SINUSITIS, UNSPECIFIED 05/30/2008    Past Surgical History:  Procedure Laterality Date  . BREAST BIOPSY  2009   lt  . BREAST BIOPSY Left 10/12/2012   Procedure: LEFT  NEEDLE LOCALIZATION EXCISIONAL BREAST BIOPSY;  Surgeon: Stark Klein, MD;  Location: [facility];  Service: General;  Laterality: Left;  . BREAST DUCTAL SYSTEM EXCISION Left 10/12/2012   Procedure: MAJOR DUCTAL EXCISION;  Surgeon: Stark Klein, MD;  Location: [facility];  Service: General;  Laterality: Left;  . COLONOSCOPY    . ROBOTIC ASSISTED BILATERAL SALPINGO OOPHERECTOMY Bilateral 06/26/2014   Procedure: ROBOTIC ASSISTED BILATERAL SALPINGECTOMY, RIGHT OOPHORECTOMY;  Surgeon: [name], MD;  Location: [facility];  Service: Gynecology;  Laterality: Bilateral;     OB History   No obstetric history on file.     Family History  Problem Relation Age of Onset  . Arthritis Other   . Uterine cancer Other        parent  . Kidney disease Other   . Diabetes Other   . Cancer Mother        ? Cervical/Ovarian  . Arthritis Mother   . Diabetes Mother   . Arthritis Sister   . Diabetes Sister   . Kidney disease Sister        transplant   . Heart disease Maternal Uncle   . Kidney disease Maternal Uncle   . Kidney disease Cousin   . Colon cancer Neg Hx     Social History   Tobacco Use  . Smoking status: Never Smoker  . Smokeless tobacco: Never Used  Substance Use Topics  .  Alcohol use: No    Alcohol/week: 0.0 standard drinks  . Drug use: No    Home Medications Prior to Admission medications   Medication Sig Start Date End Date Taking? Authorizing Provider  Biotin 1000 MCG tablet Take 5,000 mcg by mouth every morning.     [provider]  BLACK COHOSH PO Take by mouth.    [provider]  Cholecalciferol (VITAMIN D-3) 1000 UNITS CAPS Take 2 capsules by mouth every morning. 2000    [provider]  cyanocobalamin 500 MCG tablet Take 500 mcg by mouth every morning.     [provider]    Allergies    Patient has no known allergies.  Review of Systems   Review of Systems  Constitutional: Negative for chills and  fever.  HENT: Negative for congestion and rhinorrhea.   Eyes: Negative for redness and visual disturbance.  Respiratory: Negative for shortness of breath and wheezing.   Cardiovascular: Positive for chest pain (pressure on exertion) and near-syncope. Negative for palpitations.  Gastrointestinal: Negative for nausea and vomiting.  Genitourinary: Negative for dysuria and urgency.  Musculoskeletal: Negative for arthralgias and myalgias.  Skin: Negative for pallor and wound.  Neurological: Positive for syncope (near). Negative for dizziness and headaches.    Physical Exam Updated Vital Signs Ht 5\' 5"  (1.651 m)   Wt 82.5 kg   BMI 30.27 kg/m   Physical Exam Vitals and nursing note reviewed.  Constitutional:      General: She is not in acute distress.    Appearance: She is well-developed. She is not diaphoretic.  HENT:     Head: Normocephalic and atraumatic.  Eyes:     Pupils: Pupils are equal, round, and reactive to light.  Cardiovascular:     Rate and Rhythm: Normal rate and regular rhythm.     Heart sounds: No murmur heard. No friction rub. No gallop.   Pulmonary:     Effort: Pulmonary effort is normal.     Breath sounds: No wheezing or rales.  Abdominal:     General: There is no distension.     Palpations: Abdomen is soft.     Tenderness: There is no abdominal tenderness.  Musculoskeletal:        General: No tenderness.     Cervical back: Normal range of motion and neck supple.  Skin:    General: Skin is warm and dry.  Neurological:     Mental Status: She is alert and oriented to person, place, and time.  Psychiatric:        Behavior: Behavior normal.     ED Results / Procedures / Treatments   Labs (all labs ordered are listed, but only abnormal results are displayed) Labs Reviewed  RESP PANEL BY RT-PCR (FLU A&B, COVID) ARPGX2  BASIC METABOLIC PANEL  CBC  URINALYSIS, ROUTINE W REFLEX MICROSCOPIC  MAGNESIUM  CBG MONITORING, ED  TROPONIN I (HIGH SENSITIVITY)     EKG EKG Interpretation  Date/Time:  Thursday Nov 28 2020 15:58:51 EDT Ventricular Rate:  36 PR Interval:  229 QRS Duration: 159 QT Interval:  538 QTC Calculation: 417 R Axis:   233 Text Interpretation: high grade av block Prolonged PR interval IVCD, consider atypical RBBB Probable lateral infarct, age indeterminate Anteroseptal infarct, age indeterminate No old tracing to compare Confirmed by Deno Etienne [phone]) on 11/28/2020 4:08:57 PM   Radiology No results found.  Procedures Procedures   Medications Ordered in ED Medications - No data to display  ED Course  I have reviewed the triage vital signs and the nursing notes.  Pertinent labs & imaging results that were available during my care of the patient were reviewed by me and considered in my medical decision making (see chart for details).    MDM Rules/Calculators/A&P                          55 yo F with a chief complaints of feeling lightheaded.  This is occurred upon exertion and has been off and on for the past week.  Saw her family doctor today who was concerned for a Mobitz type II AV nodal block and was sent here for cardiology evaluation.  We will repeat an EKG here blood work.  EKG here is concerning for Mobitz type II versus complete heart block.  Discussed with cardiology card master will come and evaluate patient at bedside.  CRITICAL CARE Performed by: Cecilio Asper   Total critical care time: 35 minutes  Critical care time was exclusive of separately billable procedures and treating other patients.  Critical care was necessary to treat or prevent imminent or life-threatening deterioration.  Critical care was time spent personally by me on the following activities: development of treatment plan with patient and/or surrogate as well as nursing, discussions with consultants, evaluation of patient's response to treatment, examination of patient, obtaining history from patient or surrogate, ordering  and performing treatments and interventions, ordering and review of laboratory studies, ordering and review of radiographic studies, pulse oximetry and re-evaluation of patient's condition.  The patients results and plan were reviewed and discussed.   Any x-rays performed were independently reviewed by myself.   Differential diagnosis were considered with the presenting HPI.  Medications - No data to display  Vitals:   11/28/20 1537  Weight: 82.5 kg  Height: 5\' 5"  (1.651 m)    Final diagnoses:  Near syncope  AV block, complete ([place])    Admission/ observation were discussed with the admitting physician, patient and/or family and they are comfortable with the plan.   Final Clinical Impression(s) / ED Diagnoses Final diagnoses:  Near syncope  AV block, complete [facility])    Rx / DC Orders ED Discharge Orders    None       Deno Etienne, DO 11/28/20 1619

## 2020-11-28 NOTE — H&P (Signed)
ELECTROPHYSIOLOGY CONSULT NOTE    Patient ID: Sheryl Gonzalez MRN: [id number], DOB/AGE: 11/06/1965 55 y.o.  Admit date: 11/28/2020 Date of Consult: 11/28/2020  Primary Physician: [name], MD Primary Cardiologist: None  Electrophysiologist: New  Reason for admission: Symptomatic bradycardia due to Intermittent Complete Heart Block  Patient Profile: Sheryl Gonzalez is a 55 y.o. female without significant cardiac history who is being seen today for the evaluation of symptomatic bradycardia at the request of Dr. Tyrone Nine.  HPI:  Sheryl Gonzalez is a 55 y.o. female was her USOH up until about a week ago. She noticed she had a decreased exercise tolerance and mild chest discomfort with exertion. She was seen by her PCP today and noted to have profound bradycardia and sent to ED for further evaluation.   Her niece (sisters daughter) who is a Therapist, sports had a pacemaker last year at 65 years old. Her sister has had valve surgery and a kidney transplant. Her mother passed away at 3 years old without known heart issues. No known family history of sudden cardiac death or drowning.  Today, she is feeling OK at rest. She feels mild lightheadedness while walking around associated with mild chest discomfort. No recent illness. She worked the Librarian, academic within the past week with mild fatigue but otherwise without issue. No syncope. No nausea, vomiting, dizziness, or weight gain.   Past Medical History:  Diagnosis Date  . Anxiety   . Colon polyps   . Iron deficiency anemia due to chronic blood loss   . Menorrhagia   . Seasonal allergies   . Wears glasses      Surgical History:  Past Surgical History:  Procedure Laterality Date  . BREAST BIOPSY  2009   lt  . BREAST BIOPSY Left 10/12/2012   Procedure: LEFT NEEDLE LOCALIZATION EXCISIONAL BREAST BIOPSY;  Surgeon: Stark Klein, MD;  Location: [place];  Service: General;  Laterality: Left;  . BREAST DUCTAL SYSTEM  EXCISION Left 10/12/2012   Procedure: MAJOR DUCTAL EXCISION;  Surgeon: Stark Klein, MD;  Location: [place];  Service: General;  Laterality: Left;  . COLONOSCOPY    . ROBOTIC ASSISTED BILATERAL SALPINGO OOPHERECTOMY Bilateral 06/26/2014   Procedure: ROBOTIC ASSISTED BILATERAL SALPINGECTOMY, RIGHT OOPHORECTOMY;  Surgeon: [name], MD;  Location: [facility];  Service: Gynecology;  Laterality: Bilateral;     (Not in a hospital admission)   Inpatient Medications:   Allergies: No Known Allergies  Social History   Socioeconomic History  . Marital status: Single    Spouse name: Not on file  . Number of children: Not on file  . Years of education: Not on file  . Highest education level: Not on file  Occupational History  . Not on file  Tobacco Use  . Smoking status: Never Smoker  . Smokeless tobacco: Never Used  Substance and Sexual Activity  . Alcohol use: No    Alcohol/week: 0.0 standard drinks  . Drug use: No  . Sexual activity: Never  Other Topics Concern  . Not on file  Social History Narrative  . Not on file   Social Determinants of Health   Financial Resource Strain: Not on file  Food Insecurity: Not on file  Transportation Needs: Not on file  Physical Activity: Not on file  Stress: Not on file  Social Connections: Not on file  Intimate Partner Violence: Not on file     Family History  Problem Relation Age of Onset  . Arthritis Other   .  Uterine cancer Other        parent  . Kidney disease Other   . Diabetes Other   . Cancer Mother        ? Cervical/Ovarian  . Arthritis Mother   . Diabetes Mother   . Arthritis Sister   . Diabetes Sister   . Kidney disease Sister        transplant   . Heart disease Maternal Uncle   . Kidney disease Maternal Uncle   . Kidney disease Cousin   . Colon cancer Neg Hx      Review of Systems: All other systems reviewed and are otherwise negative except as noted above.  Physical Exam: Vitals:    11/28/20 1537  Weight: 82.5 kg  Height: 5\' 5"  (1.651 m)    GEN- The patient is well appearing, alert and oriented x 3 today.   HEENT: normocephalic, atraumatic; sclera clear, conjunctiva pink; hearing intact; oropharynx clear; neck supple Lungs- Clear to ausculation bilaterally, normal work of breathing.  No wheezes, rales, rhonchi Heart- Regular rate and rhythm, no murmurs, rubs or gallops GI- soft, non-tender, non-distended, bowel sounds present Extremities- no clubbing, cyanosis, or edema; DP/PT/radial pulses 2+ bilaterally MS- no significant deformity or atrophy Skin- warm and dry, no rash or lesion Psych- euthymic mood, full affect Neuro- strength and sensation are intact  Labs:   Lab Results  Component Value Date   WBC 3.2 (L) 10/26/2016   HGB 12.3 10/26/2016   HCT 38.9 10/26/2016   MCV 81.6 10/26/2016   PLT 248 10/26/2016   No results for input(s): NA, K, CL, CO2, BUN, CREATININE, CALCIUM, PROT, BILITOT, ALKPHOS, ALT, AST, GLUCOSE in the last 168 hours.  Invalid input(s): LABALBU    Radiology/Studies: No results found.  EKG: appears to show 2:1 AV block (personally reviewed)  TELEMETRY: shows CHB currently with HRs in the upper 30s (personally reviewed)  Assessment/Plan: 1.  Complete Heart Block Also with Mobitz 2 block by earlier EKG.  No AV nodal on board. Suspicious family history of niece with pacer in her 88s. Will plan cMRI for evaluation of infiltrative cardiomyopathy. Ultimately, she will likely require pacing. Will discuss timing of pacer based on timeliness of her other work up.  Blood work pending.  For questions or updates, please contact [place] Please consult [email or web address] for contact info under Cardiology/STEMI.  Jacalyn Lefevre, PA-C  11/28/2020 4:27 PM

## 2020-11-28 NOTE — Progress Notes (Signed)
  Echocardiogram 2D Echocardiogram has been performed.  Sheryl Gonzalez 11/28/2020, 6:02 PM

## 2020-11-28 NOTE — ED Triage Notes (Signed)
Pt BIB [facility] for bradycardia, pt seen at PCP for fatigue and SOB w/exertion and some dizziness since last Saturday pt found to be in Type 2 heart block , no hx of the same. Pt denies CP, N/V. Pt a/o x4  BP 100/60 HR 30 100% RA RR 16  20g LAC 500 cc NS

## 2020-11-28 NOTE — Progress Notes (Signed)
   11/28/20 1934  Assess: MEWS Score  Temp 98.2 F (36.8 C)  BP (!) 145/59  Pulse Rate (!) 37  ECG Heart Rate (!) 37  Resp 14  SpO2 99 %  O2 Device Room Air  Assess: MEWS Score  MEWS Temp 0  MEWS Systolic 0  MEWS Pulse 2  MEWS RR 0  MEWS LOC 0  MEWS Score 2  MEWS Score Color Yellow  Assess: if the MEWS score is Yellow or Red  Were vital signs taken at a resting state? Yes  Focused Assessment No change from prior assessment  Early Detection of Sepsis Score *See Row Information* Low  MEWS guidelines implemented *See Row Information* Yes  Treat  MEWS Interventions Escalated (See documentation below)  Pain Scale 0-10  Pain Score 0  Pain Type Acute pain  Take Vital Signs  Increase Vital Sign Frequency  Yellow: Q 2hr X 2 then Q 4hr X 2, if remains yellow, continue Q 4hrs  Escalate  MEWS: Escalate Yellow: discuss with charge nurse/RN and consider discussing with provider and RRT  Notify: Charge Nurse/RN  Name of Charge Nurse/RN Notified Shanell RN  Date Charge Nurse/RN Notified 11/28/20  Time Charge Nurse/RN Notified 2033  Document  Patient Outcome Stabilized after interventions  Progress note created (see row info) Yes

## 2020-11-28 NOTE — Plan of Care (Signed)

## 2020-11-29 ENCOUNTER — Inpatient Hospital Stay (HOSPITAL_COMMUNITY): Payer: BC Managed Care – PPO

## 2020-11-29 ENCOUNTER — Inpatient Hospital Stay (HOSPITAL_COMMUNITY)
Admission: EM | Disposition: A | Discharge status: Home / Self Care | Payer: Self-pay | Source: Home / Self Care | Attending: Cardiology

## 2020-11-29 DIAGNOSIS — I442 Atrioventricular block, complete: Secondary | ICD-10-CM

## 2020-11-29 DIAGNOSIS — R001 Bradycardia, unspecified: Secondary | ICD-10-CM

## 2020-11-29 HISTORY — PX: BIV ICD INSERTION CRT-D: EP1195

## 2020-11-29 LAB — COMPREHENSIVE METABOLIC PANEL
ALT: 16 U/L (ref 0–44)
AST: 15 U/L (ref 15–41)
Albumin: 3.2 g/dL — ABNORMAL LOW (ref 3.5–5.0)
Alkaline Phosphatase: 42 U/L (ref 38–126)
Anion gap: 6 (ref 5–15)
BUN: 15 mg/dL (ref 6–20)
CO2: 27 mmol/L (ref 22–32)
Calcium: 8.7 mg/dL — ABNORMAL LOW (ref 8.9–10.3)
Chloride: 108 mmol/L (ref 98–111)
Creatinine, Ser: 1.1 mg/dL — ABNORMAL HIGH (ref 0.44–1.00)
GFR, Estimated: 59 mL/min — ABNORMAL LOW (ref >60)
Glucose, Bld: 91 mg/dL (ref 70–99)
Potassium: 4.4 mmol/L (ref 3.5–5.1)
Sodium: 141 mmol/L (ref 135–145)
Total Bilirubin: 0.4 mg/dL (ref 0.3–1.2)
Total Protein: 5.3 g/dL — ABNORMAL LOW (ref 6.5–8.1)

## 2020-11-29 LAB — ECHOCARDIOGRAM COMPLETE
AR max vel: 2.07 cm2
AV Area VTI: 2.23 cm2
AV Area mean vel: 2.32 cm2
AV Mean grad: 4 mmHg
AV Peak grad: 10.4 mmHg
Ao pk vel: 1.61 m/s
Area-P 1/2: 2.82 cm2
Height: 65 in
S' Lateral: 3.1 cm
Weight: 2910.07 oz

## 2020-11-29 LAB — SURGICAL PCR SCREEN
MRSA, PCR: NEGATIVE
Staphylococcus aureus: NEGATIVE

## 2020-11-29 SURGERY — BIV ICD INSERTION CRT-D

## 2020-11-29 MED ORDER — SODIUM CHLORIDE 0.9 % IV SOLN
80.0000 mg | INTRAVENOUS | Status: AC
  Administered 2020-11-29: 80 mg

## 2020-11-29 MED ORDER — HEPARIN (PORCINE) IN NACL 1000-0.9 UT/500ML-% IV SOLN
INTRAVENOUS | Status: AC
  Filled 2020-11-29: qty 500

## 2020-11-29 MED ORDER — FENTANYL CITRATE (PF) 100 MCG/2ML IJ SOLN
INTRAMUSCULAR | Status: DC | PRN
  Administered 2020-11-29: 25 ug via INTRAVENOUS
  Administered 2020-11-29 (×2): 12.5 ug via INTRAVENOUS
  Administered 2020-11-29: 25 ug via INTRAVENOUS

## 2020-11-29 MED ORDER — LIDOCAINE HCL 1 % IJ SOLN
INTRAMUSCULAR | Status: AC
  Filled 2020-11-29: qty 40

## 2020-11-29 MED ORDER — LIDOCAINE HCL (PF) 1 % IJ SOLN
INTRAMUSCULAR | Status: DC | PRN
  Administered 2020-11-29: 30 mL via INTRADERMAL

## 2020-11-29 MED ORDER — SODIUM CHLORIDE 0.9 % IV SOLN: INTRAVENOUS | Status: DC

## 2020-11-29 MED ORDER — HEPARIN (PORCINE) IN NACL 1000-0.9 UT/500ML-% IV SOLN
INTRAVENOUS | Status: DC | PRN
  Administered 2020-11-29: 500 mL

## 2020-11-29 MED ORDER — CEFAZOLIN SODIUM-DEXTROSE 2-4 GM/100ML-% IV SOLN
2.0000 g | INTRAVENOUS | Status: AC
  Administered 2020-11-29: 2 g via INTRAVENOUS

## 2020-11-29 MED ORDER — MIDAZOLAM HCL 5 MG/5ML IJ SOLN
INTRAMUSCULAR | Status: AC
  Filled 2020-11-29: qty 5

## 2020-11-29 MED ORDER — GADOBUTROL 1 MMOL/ML IV SOLN
8.0000 mL | Freq: Once | INTRAVENOUS | Status: AC | PRN
  Administered 2020-11-29: 8 mL via INTRAVENOUS

## 2020-11-29 MED ORDER — FENTANYL CITRATE (PF) 100 MCG/2ML IJ SOLN
INTRAMUSCULAR | Status: AC
  Filled 2020-11-29: qty 2

## 2020-11-29 MED ORDER — SODIUM CHLORIDE 0.9 % IV SOLN
INTRAVENOUS | Status: AC
  Filled 2020-11-29: qty 2

## 2020-11-29 MED ORDER — ACETAMINOPHEN 325 MG PO TABS
650.0000 mg | ORAL_TABLET | Freq: Four times a day (QID) | ORAL | Status: DC | PRN
  Administered 2020-11-29 – 2020-11-30 (×3): 650 mg via ORAL
  Filled 2020-11-29 (×3): qty 2

## 2020-11-29 MED ORDER — MIDAZOLAM HCL 5 MG/5ML IJ SOLN
INTRAMUSCULAR | Status: DC | PRN
  Administered 2020-11-29 (×3): 1 mg via INTRAVENOUS
  Administered 2020-11-29: 2 mg via INTRAVENOUS

## 2020-11-29 MED ORDER — CEFAZOLIN SODIUM-DEXTROSE 2-4 GM/100ML-% IV SOLN
INTRAVENOUS | Status: AC
  Filled 2020-11-29: qty 100

## 2020-11-29 MED ORDER — CHLORHEXIDINE GLUCONATE 4 % EX LIQD: 60.0000 mL | Freq: Once | CUTANEOUS | Status: DC

## 2020-11-29 SURGICAL SUPPLY — 14 items
CABLE SURGICAL S-101-97-12 (CABLE) ×4 IMPLANT
CATH HIS SELECTSITE C304HIS (CATHETERS) ×2 IMPLANT
ICD UNIFY ASURA CRT CD3357-40C (ICD Generator) ×2 IMPLANT
KIT MICROPUNCTURE NIT STIFF (SHEATH) ×2 IMPLANT
LEAD DURATA 7122-65CM (Lead) ×2 IMPLANT
LEAD SELECT SECURE 3830 383069 (Lead) ×2 IMPLANT
LEAD TENDRIL MRI 46CM LPA1200M (Lead) ×2 IMPLANT
PAD PRO RADIOLUCENT 2001M-C (PAD) ×2 IMPLANT
SELECT SECURE 3830 383069 (Lead) ×4 IMPLANT
SHEATH 8FR PRELUDE SNAP 13 (SHEATH) ×4 IMPLANT
SHEATH 9.5FR PRELUDE SNAP 13 (SHEATH) ×2 IMPLANT
SLITTER 6232ADJ (MISCELLANEOUS) ×2 IMPLANT
TRAY PACEMAKER INSERTION (PACKS) ×2 IMPLANT
WIRE HI TORQ VERSACORE-J 145CM (WIRE) ×2 IMPLANT

## 2020-11-29 NOTE — Progress Notes (Signed)
Chaplain responded to consult request for AD. Patient might have procedure today and if so, will go home tomorrow. Chaplain told her that the document could be notarized at any [employer] store or her bank. If she does not have procedure tomorrow, we can complete it on Monday.Chaplain is available when needed.    11/29/20 1100  Clinical Encounter Type  Visited With Patient  Visit Type Initial  Referral From Nurse  Consult/Referral To Chaplain

## 2020-11-29 NOTE — Discharge Instructions (Signed)
After Your ICD (Implantable Cardiac Defibrillator)   . You have a [employer] ICD  ACTIVITY . Do not lift your arm above shoulder height for 1 week after your procedure. After 7 days, you may progress as below.  . You should remove your sling 24 hours after your procedure, unless otherwise instructed by your provider.     Friday Dec 06, 2020  Saturday Dec 07, 2020 Sunday Dec 08, 2020 Monday Dec 09, 2020   . Do not lift, push, pull, or carry anything over 10 pounds with the affected arm until 6 weeks (Friday January 10, 2021 ) after your procedure.   . You may drive AFTER your wound check, unless you have been told otherwise by your provider.   . Ask your healthcare provider when you can go back to work   INCISION/Dressing . If you are on a blood thinner such as Coumadin, Xarelto, Eliquis, Plavix, or Pradaxa please confirm with your provider when this should be resumed.   . If large square, outer bandage is left in place, this can be removed after 24 hours from your procedure. Do not remove steri-strips or glue as below.   . Monitor your defibrillator site for redness, swelling, and drainage. Call the device clinic at [phone] if you experience these symptoms or fever/chills.  . If your incision is closed with Dermabond/Surgical glue. You may shower 1 day after your pacemaker implant and wash around the site with soap and water.    [name] Avoid lotions, ointments, or perfumes over your incision until it is well-healed.  . You may use a hot tub or a pool AFTER your wound check appointment if the incision is completely closed.  . Your ICD is designed to protect you from life threatening heart rhythms. Because of this, you may receive a shock.   o 1 shock with no symptoms:  Call the office during business hours. o 1 shock with symptoms (chest pain, chest pressure, dizziness, lightheadedness, shortness of breath, overall feeling unwell):  Call 911. o If you experience 2 or more shocks in 24  hours:  Call 911. o If you receive a shock, you should not drive for 6 months per the [place] DMV IF you receive appropriate therapy from your ICD.   . ICD Alerts:  Some alerts are vibratory and others beep. These are NOT emergencies. Please call our office to let us know. If this occurs at night or on weekends, it can wait until the next business day. Send a remote transmission.  . If your device is capable of reading fluid status (for heart failure), you will be offered monthly monitoring to review this with you.   DEVICE MANAGEMENT . Remote monitoring is used to monitor your ICD from home. This monitoring is scheduled every 91 days by our office. It allows [name] to keep an eye on the functioning of your device to ensure it is working properly. You will routinely see your Electrophysiologist annually (more often if necessary).   . You should receive your ID card for your new device in 4-8 weeks. Keep this card with you at all times once received. Consider wearing a medical alert bracelet or necklace.  . Your ICD  may be MRI compatible. This will be discussed at your next office visit/wound check.  You should avoid contact with strong electric or magnetic fields.    Do not use amateur (ham) radio equipment or electric (arc) welding torches. MP3 player headphones with magnets should not be  used. Some devices are safe to use if held at least 12 inches (30 cm) from your defibrillator. These include power tools, lawn mowers, and speakers. If you are unsure if something is safe to use, ask your health care provider.   When using your cell phone, hold it to the ear that is on the opposite side from the defibrillator. Do not leave your cell phone in a pocket over the defibrillator.   You may safely use electric blankets, heating pads, computers, and microwave ovens.  Call the office right away if:  You have chest pain.  You feel more than one shock.  You feel more short of breath than you have felt  before.  You feel more light-headed than you have felt before.  Your incision starts to open up.  This information is not intended to replace advice given to you by your health care provider. Make sure you discuss any questions you have with your health care provider.

## 2020-11-29 NOTE — Progress Notes (Addendum)
Electrophysiology Rounding Note  Patient Name: Sheryl Gonzalez Date of Encounter: 11/29/2020  Primary Cardiologist: None Electrophysiologist: New   Subjective   The patient is doing well today.  At this time, the patient denies chest pain, shortness of breath, or any new concerns.  Remains in CHB / Mobitz II 2:1 AV block  Inpatient Medications    Scheduled Meds: . cholecalciferol  2,000 Units Oral q morning  . enoxaparin (LOVENOX) injection  40 mg Subcutaneous Q24H  . vitamin B-12  500 mcg Oral q morning   Continuous Infusions:  PRN Meds:    Vital Signs    Vitals:   11/28/20 2205 11/29/20 0017 11/29/20 0418 11/29/20 0450  BP: 136/78 (!) 121/52 (!) 117/57   Pulse: (!) 40 (!) 33 (!) 35   Resp: 16 17 12    Temp: 98.1 F (36.7 C)   97.9 F (36.6 C)  TempSrc: Oral   Oral  SpO2: 99% 96% 98%   Weight:      Height:        Intake/Output Summary (Last 24 hours) at 11/29/2020 0752 Last data filed at 11/28/2020 2055 Gross per 24 hour  Intake 180 ml  Output --  Net 180 ml   Filed Weights   11/28/20 1537  Weight: 82.5 kg    Physical Exam    GEN- The patient is well appearing, alert and oriented x 3 today.   Head- normocephalic, atraumatic Eyes-  Sclera clear, conjunctiva pink Ears- hearing intact Oropharynx- clear Neck- supple Lungs- Clear to ausculation bilaterally, normal work of breathing Heart- Slow and regular rate and rhythm, no murmurs, rubs or gallops GI- soft, NT, ND, + BS Extremities- no clubbing or cyanosis. No edema Skin- no rash or lesion Psych- euthymic mood, full affect Neuro- strength and sensation are intact  Labs    CBC Recent Labs    11/28/20 1539 11/28/20 2007  WBC 5.4 5.2  HGB 12.2 12.5  HCT 38.9 39.9  MCV 83.7 83.5  PLT 224 063   Basic Metabolic Panel Recent Labs    11/28/20 1539 11/28/20 2007 11/29/20 0352  NA 142  --  141  K 4.1  --  4.4  CL 110  --  108  CO2 25  --  27  GLUCOSE 94  --  91  BUN 14  --  15   CREATININE 1.13* 1.09* 1.10*  CALCIUM 8.9  --  8.7*  MG 2.0  --   --    Liver Function Tests Recent Labs    11/29/20 0352  AST 15  ALT 16  ALKPHOS 42  BILITOT 0.4  PROT 5.3*  ALBUMIN 3.2*   No results for input(s): LIPASE, AMYLASE in the last 72 hours. Cardiac Enzymes No results for input(s): CKTOTAL, CKMB, CKMBINDEX, TROPONINI in the last 72 hours.   Telemetry    HRs 30s, CHB intermittent 2:1 (personally reviewed)  Radiology    No results found.  Patient Profile     Sheryl Gonzalez is a 55 y.o. female without significant cardiac history who is being seen today for the evaluation of symptomatic bradycardia at the request of Dr. Tyrone Nine.  Assessment & Plan    1.  Complete Heart Block Also with Mobitz 2 block by earlier EKG.  No AV nodal on board. Suspicious family history of niece with pacer in her 19s. Will plan cMRI for evaluation of infiltrative cardiomyopathy this am. Ultimately, she will likely require pacing.  Explained risks, benefits, and alternatives to PPM  implantation, including but not limited to bleeding, infection, pneumothorax, pericardial effusion, lead dislodgement, heart attack, stroke, or death.  Pt verbalized understanding and agrees to proceed once work up complete, possibly as early as this afternoon.  COVID negative.   For questions or updates, please contact [place] Please consult [email or web address] for contact info under Cardiology/STEMI.  Signed, Shirley Friar, PA-C  11/29/2020, 7:52 AM   Have discussed w DrGT and Dr Jennette Bill.  cMRI consistent with sarcoid and so will place HV device for protection against ventricular arrhythmia.  With normal LV function no indication for BiV pacing but will anticipate LBBB area lead.  The hope would be that, if PET scanning confirms sarcoid, that immunosuppressive therapy would reverse the heart block and significant pacing would not ensue.    Have reviewed the potential benefits and risks of ICD  implantation including but not limited to death, perforation of heart or lung, lead dislodgement, infection,  device malfunction and inappropriate shocks.  The patient express understanding  and are willing to proceed.     he patient's chart has been reviewed and they meet clincal criteria for ICD implant.  I have had a thorough discussion with the patient reviewing options.  The patient and their family (if available) have had opportunities to ask questions and have them answered. The patient and I have decided together through a shared decision making process to implant ICD at this time.  Risks, benefits, alternatives to ICD implantation were discussed in detail with the patient today. The patient  understands that the risks include but are not limited to bleeding, infection, pneumothorax, perforation, tamponade, vascular damage, renal failure, MI, stroke, death, inappropriate shocks, and lead dislodgement and  wishes to proceed.  We will therefore schedule device implantation at the next available time.

## 2020-11-30 ENCOUNTER — Inpatient Hospital Stay (HOSPITAL_COMMUNITY): Payer: BC Managed Care – PPO

## 2020-11-30 ENCOUNTER — Encounter (HOSPITAL_COMMUNITY): Payer: Self-pay | Admitting: Cardiology

## 2020-11-30 DIAGNOSIS — R001 Bradycardia, unspecified: Secondary | ICD-10-CM

## 2020-11-30 DIAGNOSIS — Z95810 Presence of automatic (implantable) cardiac defibrillator: Secondary | ICD-10-CM

## 2020-11-30 DIAGNOSIS — D869 Sarcoidosis, unspecified: Secondary | ICD-10-CM

## 2020-11-30 HISTORY — DX: Presence of automatic (implantable) cardiac defibrillator: Z95.810

## 2020-11-30 HISTORY — DX: Bradycardia, unspecified: R00.1

## 2020-11-30 HISTORY — DX: Sarcoidosis, unspecified: D86.9

## 2020-11-30 LAB — CBC WITH DIFFERENTIAL/PLATELET
Abs Immature Granulocytes: 0.01 10*3/uL (ref 0.00–0.07)
Basophils Absolute: 0 10*3/uL (ref 0.0–0.1)
Basophils Relative: 1 %
Eosinophils Absolute: 0.2 10*3/uL (ref 0.0–0.5)
Eosinophils Relative: 5 %
HCT: 37.3 % (ref 36.0–46.0)
Hemoglobin: 11.8 g/dL — ABNORMAL LOW (ref 12.0–15.0)
Immature Granulocytes: 0 %
Lymphocytes Relative: 36 %
Lymphs Abs: 1.6 10*3/uL (ref 0.7–4.0)
MCH: 26 pg (ref 26.0–34.0)
MCHC: 31.6 g/dL (ref 30.0–36.0)
MCV: 82.2 fL (ref 80.0–100.0)
Monocytes Absolute: 0.4 10*3/uL (ref 0.1–1.0)
Monocytes Relative: 8 %
Neutro Abs: 2.2 10*3/uL (ref 1.7–7.7)
Neutrophils Relative %: 50 %
Platelets: 192 10*3/uL (ref 150–400)
RBC: 4.54 MIL/uL (ref 3.87–5.11)
RDW: 13.5 % (ref 11.5–15.5)
WBC: 4.3 10*3/uL (ref 4.0–10.5)
nRBC: 0 % (ref 0.0–0.2)

## 2020-11-30 LAB — BASIC METABOLIC PANEL
Anion gap: 7 (ref 5–15)
BUN: 13 mg/dL (ref 6–20)
CO2: 25 mmol/L (ref 22–32)
Calcium: 8.8 mg/dL — ABNORMAL LOW (ref 8.9–10.3)
Chloride: 107 mmol/L (ref 98–111)
Creatinine, Ser: 0.98 mg/dL (ref 0.44–1.00)
GFR, Estimated: >60 mL/min (ref >60)
Glucose, Bld: 96 mg/dL (ref 70–99)
Potassium: 4 mmol/L (ref 3.5–5.1)
Sodium: 139 mmol/L (ref 135–145)

## 2020-11-30 MED ORDER — ACETAMINOPHEN 325 MG PO TABS: 650.0000 mg | ORAL_TABLET | Freq: Four times a day (QID) | ORAL | Status: DC | PRN

## 2020-11-30 MED ORDER — VITAMIN D-3 25 MCG (1000 UT) PO CAPS: 2.0000 | ORAL_CAPSULE | Freq: Every morning | ORAL | Status: DC

## 2020-11-30 MED ORDER — CYANOCOBALAMIN 500 MCG PO TABS: 500.0000 ug | ORAL_TABLET | Freq: Every morning | ORAL | Status: AC

## 2020-11-30 NOTE — Progress Notes (Signed)
Central Telemetry called to say that the patient a 7 beat run of AIVR.  Patient is asymptomatic.  MD Notified.  Will continue to monitor.   Donah Driver, RN

## 2020-11-30 NOTE — Progress Notes (Signed)
   Progress Note   Subjective   Doing well today, the patient denies CP or SOB.  No new concerns  Inpatient Medications    Scheduled Meds: . cholecalciferol  2,000 Units Oral q morning  . vitamin B-12  500 mcg Oral q morning   Continuous Infusions:  PRN Meds: acetaminophen   Vital Signs    Vitals:   11/29/20 1802 11/29/20 2109 11/30/20 0023 11/30/20 0824  BP: 139/73 132/69 (!) 97/56 122/69  Pulse: 76 73 67 69  Resp: 11 17 18 13   Temp:  98.4 F (36.9 C)  98.1 F (36.7 C)  TempSrc:  Oral  Oral  SpO2: 100% 98% 94% 95%  Weight:      Height:        Intake/Output Summary (Last 24 hours) at 11/30/2020 0934 Last data filed at 11/29/2020 1600 Gross per 24 hour  Intake 304.82 ml  Output 600 ml  Net -295.18 ml   Filed Weights   11/28/20 1537  Weight: 82.5 kg    Telemetry    Sinus with V pacing - Personally Reviewed  Physical Exam   GEN- The patient is well appearing, alert and oriented x 3 today.   Head- normocephalic, atraumatic Eyes-  Sclera clear, conjunctiva pink Ears- hearing intact Oropharynx- clear Neck- supple, Lungs-  normal work of breathing Heart- Regular rate and rhythm  GI- soft  Extremities- no clubbing, cyanosis, or edema  MS- no significant deformity or atrophy Skin- no rash or lesion Psych- euthymic mood, full affect Neuro- strength and sensation are intact   Labs    Chemistry Recent Labs  Lab 11/28/20 1539 11/29/20 0352 11/30/20 0217  NA 142 141 139  K 4.1 4.4 4.0  CL 110 108 107  CO2 25 27 25   GLUCOSE 94 91 96  BUN 14 15 13   CREATININE 1.13* 1.10* 0.98  CALCIUM 8.9 8.7* 8.8*  PROT  --  5.3*  --   ALBUMIN  --  3.2*  --   AST  --  15  --   ALT  --  16  --   ALKPHOS  --  42  --   BILITOT  --  0.4  --   GFRNONAA 57* 59* >60  ANIONGAP 7 6 7      Hematology Recent Labs  Lab 11/28/20 1539 11/30/20 0217  WBC 5.4 4.3  RBC 4.65 4.54  HGB 12.2 11.8*  HCT 38.9 37.3  MCV 83.7 82.2  MCH 26.2 26.0  MCHC 31.4 31.6  RDW  13.4 13.5  PLT 224 192     Patient ID  Sheryl Gonzalez is a 55 y.o. female without significant cardiac history who is being seen today for the evaluation of symptomatic bradycardia at the request of Dr. Tyrone Nine.  Assessment & Plan    1.  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  I will order CXR.  If ok, anticipate discharge later today.  EKG also ordered.  2. Sarcoidosis New diagnosis Will need close follow-up in office with Dr Caryl Comes to further evaluate.    Discharge to home today  Thompson Grayer MD, [facility] 11/30/2020 9:34 AM

## 2020-11-30 NOTE — Discharge Summary (Signed)
Discharge Summary    Patient ID: Sheryl Gonzalez MRN: [id number]; DOB: [birth year]  Admit date: 11/28/2020 Discharge date: 11/30/2020  PCP:  [name], MD   [facility] Providers Cardiologist:  [name], MD  Electrophysiologist:  [name], MD       Discharge Diagnoses    Principal Problem:   Complete heart block [facility]) Active Problems:   Symptomatic bradycardia   Biventricular ICD (implantable cardioverter-defibrillator) in place s/p placement 11/29/20   Sarcoidosis    Diagnostic Studies/Procedures    Echo 11/28/20 IMPRESSIONS    1. 2:1 AV block throughout the study, but annular diastolic velocities  are normal. Left ventricular ejection fraction, by estimation, is 60 to  65%. The left ventricle has normal function. The left ventricle has no  regional wall motion abnormalities.  Left ventricular diastolic parameters were normal.  2. Right ventricular systolic function is normal. The right ventricular  size is normal. There is mildly elevated pulmonary artery systolic  pressure. The estimated right ventricular systolic pressure is Q000111Q mmHg.  3. The mitral valve is normal in structure. Trivial mitral valve  regurgitation. No evidence of mitral stenosis.  4. Tricuspid valve regurgitation is mild to moderate.  5. The aortic valve is normal in structure. Aortic valve regurgitation is  not visualized. No aortic stenosis is present.  6. The inferior vena cava is normal in size with greater than 50%  respiratory variability, suggesting right atrial pressure of 3 mmHg.   FINDINGS  Left Ventricle: 2:1 AV block throughout the study, but annular diastolic  velocities are normal. Left ventricular ejection fraction, by estimation,  is 60 to 65%. The left ventricle has normal function. The left ventricle  has no regional wall motion  abnormalities. The left ventricular internal cavity size was normal in  size. There is no left ventricular  hypertrophy. Left ventricular diastolic  parameters were normal. Indeterminate filling pressures.   Right Ventricle: The right ventricular size is normal. No increase in  right ventricular wall thickness. Right ventricular systolic function is  normal. There is mildly elevated pulmonary artery systolic pressure. The  tricuspid regurgitant velocity is 2.66  m/s, and with an assumed right atrial pressure of 8 mmHg, the estimated  right ventricular systolic pressure is Q000111Q mmHg.   Left Atrium: Left atrial size was normal in size.   Right Atrium: Right atrial size was normal in size.   Pericardium: There is no evidence of pericardial effusion.   Mitral Valve: The mitral valve is normal in structure. Trivial mitral  valve regurgitation. No evidence of mitral valve stenosis. MV peak  gradient, 8.6 mmHg. The mean mitral valve gradient is 1.0 mmHg.   Tricuspid Valve: The tricuspid valve is normal in structure. Tricuspid  valve regurgitation is mild to moderate. No evidence of tricuspid  stenosis.   Aortic Valve: The aortic valve is normal in structure. Aortic valve  regurgitation is not visualized. No aortic stenosis is present. Aortic  valve mean gradient measures 4.0 mmHg. Aortic valve peak gradient measures  10.4 mmHg. Aortic valve area, by VTI  measures 2.23 cm.   Pulmonic Valve: The pulmonic valve was normal in structure. Pulmonic valve  regurgitation is not visualized. No evidence of pulmonic stenosis.   Aorta: The aortic root is normal in size and structure.   Venous: The inferior vena cava is normal in size with greater than 50%  respiratory variability, suggesting right atrial pressure of 3 mmHg.   IAS/Shunts: No atrial level shunt detected by  color flow Doppler.    11/29/20 ICD ST Jude placed    The leads were secured to the prepectoral fascia and then attached to a [place], model 3357, serial number  [id number].  Through the device, the bipolar P wave was 2.8  millivolts, impedance was 540 ohms, the pacing threshold was 1.23 volts at 0.5 msec.  The bipolar R wave was na millivolts, impedance was 930 ohms, the pacing threshold was 0.5 volts at 0.5 msec.  Left bundle branch block impedance was 630 ohms with a threshold 0.5 V at 0.5 ms high-voltage impedance was 70  ohms.  The patient received a biventricular device so as to allow for left bundle branch block area pacing to mitigate the risk of pacemaker associated cardiomyopathy--this was accomplished using the left ventricular paced sensed port. _____________   History of Present Illness     Sheryl Gonzalez is a 55 y.o. female without significant Cardiac disease, was seen in ER 11/28/20 after sent by PCP to ER for dizziness, fatigue and tightness in chest and EKG with Type 2 AV block.   Symptoms began a week ago.  EKG in ER with Mobitz II, vs complete HB and EP was consulted.  It was found pt's niece had PPM placed at 30 yrs of age.  No FH of sudden cardiac death.   She was seen by Dr. Quentin Ore and plans for admit and possible PPM.    K+ 4.0, Cr 0.98 Hgb 11.8 WBC 4.8 plts 192  TSH 1.857  COVID neg.   Hospital Course     Consultants: none   MRI was done normal LV and RV function.  atypical pattern of LGE This could be consistent with cardiac sarcoidosis. Discussed with EP team  Pt with Mobitz 2 and CHB on EKG and with symptomatic bradycardia.    Today CXR with 3 lead device BiV AICD placed. No pneumothorax.   EKG SR with V pacing. Dr. Rayann Heman has seen and found stable for discharge.  Her device was interrogated and stable.   Pt with new dx of sarcoid.  She will follow up with Dr. Caryl Comes in [place].  He will further evaluate.     Did the patient have an acute coronary syndrome (MI, NSTEMI, STEMI, etc) this admission?:  No                               Did the patient have a percutaneous coronary intervention (stent / angioplasty)?:  No.       _____________  Discharge Vitals Blood pressure  122/69, pulse 69, temperature 98.1 F (36.7 C), temperature source Oral, resp. rate 13, height 5\' 5"  (1.651 m), weight 82.5 kg, SpO2 95 %.  Filed Weights   11/28/20 1537  Weight: 82.5 kg    Labs & Radiologic Studies    CBC Recent Labs    11/28/20 1539 11/30/20 0217  WBC 5.4 4.3  NEUTROABS  --  2.2  HGB 12.2 11.8*  HCT 38.9 37.3  MCV 83.7 82.2  PLT 224 [id number]   Basic Metabolic Panel Recent Labs    11/28/20 1539 11/29/20 0352 11/30/20 0217  NA 142 141 139  K 4.1 4.4 4.0  CL 110 108 107  CO2 25 27 25   GLUCOSE 94 91 96  BUN 14 15 13   CREATININE 1.13* 1.10* 0.98  CALCIUM 8.9 8.7* 8.8*  MG 2.0  --   --    Liver  Function Tests Recent Labs    11/29/20 0352  AST 15  ALT 16  ALKPHOS 42  BILITOT 0.4  PROT 5.3*  ALBUMIN 3.2*   No results for input(s): LIPASE, AMYLASE in the last 72 hours. High Sensitivity Troponin:   Recent Labs  Lab 11/28/20 1539  TROPONINIHS 8    BNP Invalid input(s): POCBNP D-Dimer No results for input(s): DDIMER in the last 72 hours. Hemoglobin A1C No results for input(s): HGBA1C in the last 72 hours. Fasting Lipid Panel No results for input(s): CHOL, HDL, LDLCALC, TRIG, CHOLHDL, LDLDIRECT in the last 72 hours. Thyroid Function Tests Recent Labs    11/28/20 1621  TSH 1.857   _____________  DG Chest 2 View  Result Date: 11/30/2020 CLINICAL DATA:  Chest soreness.  Status post AICD placement. EXAM: CHEST - 2 VIEW COMPARISON:  None. FINDINGS: A 3 lead AICD device has been placed in the interval. No pneumothorax. The heart, hila, mediastinum, lungs, and pleura are normal. IMPRESSION: Three lead AICD device placed as above.  No pneumothorax. Electronically Signed   By: Dorise Bullion III M.D   On: 11/30/2020 10:41   MR CARDIAC MORPHOLOGY W WO CONTRAST  Result Date: 11/29/2020 CLINICAL DATA:  Clinical question of myocardial scar 55 year-old Caucasian female EXAM: CARDIAC MRI TECHNIQUE: The patient was scanned on a 1.5 Tesla GE magnet. A  dedicated cardiac coil was used. Functional imaging was done using [employer] sequences. 2,3, and 4 chamber views were done to assess for RWMA's. Modified Simpson's rule using a short axis stack was used to calculate an ejection fraction on a dedicated work Conservation officer, nature. The patient received 8 cc of Gadavist. After 10 minutes inversion recovery sequences were used to assess for infiltration and scar tissue. During the scan, the power went out and emergency power returned. Images where reconstructed and sent to [employer]. Unable to bring up T1 maps. Given patient's need for device therapy; patient was brought back 45 minutes after study. Parametric mapping sequences were performed. Presently, images are available and two separate studies; these will be merged with IT assistance; time sensitive study. CONTRAST:  8 cc  of Gadavist FINDINGS: 1. Normal left ventricular size, with LVEDD 57 mm. Normal left ventricular thickness, with intraventricular septal thickness of 9 mm, posterior wall thickness of 5 mm. Normal left ventricular systolic function (LVEF =95%). There are no regional wall motion abnormalities. Left ventricular parametric mapping notable for abnormal T2 signal in the basal anteroseptal (60 ms), mid anteroseptal (59 ms), mid inferoseptal (59 ms), and apical inferoseptal (57 ms). There were no areas of enhancement free myocardium that have abnormal parametric mapping. There is notable late gadolinium enhancement in the left ventricular myocardium: 25% thickness endomyocardial basal anteroseptal; 50% thickness endomyocardial mid anteroseptal, mid myocardial basal anteroseptal and inferoseptal, mid anteroseptal and inferoseptal, and apical anteroseptal and mid inferoseptal. 2. Normal right ventricular size Normal right ventricular thickness. Normal right ventricular systolic function (RVEF = 61%). There are no regional wall motion abnormalities or aneurysms. 3.  Normal left and right atrial  size. 4. Normal size of the aortic root, ascending aorta. Moderate main pulmonary artery dilation: 30 mm may suggest presence of pulmonary hypertension; clinical correlation advised. 5.  No significant valvular abnormalities. 6.  Normal pericardium.  No pericardial effusion. 7. Grossly, no extracardiac findings. Recommended dedicated study if concerned for non-cardiac pathology. IMPRESSION: Normal LV and RV function. Atypical pattern of LGE: This could be consistent with cardiac sarcoidosis. Discussed with EP team. Rudean Haskell  MD Electronically Signed   By: Rudean Haskell MD   On: 11/29/2020 14:30   ECHOCARDIOGRAM COMPLETE  Result Date: 11/29/2020    ECHOCARDIOGRAM REPORT   Patient Name:   Tasia Catchings Date of Exam: 11/28/2020 Medical Rec #:  [id number]           Height:       65.0 in Accession #:    [id number]          Weight:       181.9 lb Date of Birth:  [birth year]           BSA:          1.900 m Patient Age:    72 years            BP:           144/66 mmHg Patient Gender: F                   HR:           37 bpm. Exam Location:  Inpatient Procedure: 2D Echo, Cardiac Doppler and Color Doppler Indications:    Complete heart block  History:        Patient has no prior history of Echocardiogram examinations.  Sonographer:    Clayton Lefort RDCS (AE) Referring Phys: [id number] Satira Mccallum [place]  1. 2:1 AV block throughout the study, but annular diastolic velocities are normal. Left ventricular ejection fraction, by estimation, is 60 to 65%. The left ventricle has normal function. The left ventricle has no regional wall motion abnormalities. Left ventricular diastolic parameters were normal.  2. Right ventricular systolic function is normal. The right ventricular size is normal. There is mildly elevated pulmonary artery systolic pressure. The estimated right ventricular systolic pressure is 30.8 mmHg.  3. The mitral valve is normal in structure. Trivial mitral valve regurgitation.  No evidence of mitral stenosis.  4. Tricuspid valve regurgitation is mild to moderate.  5. The aortic valve is normal in structure. Aortic valve regurgitation is not visualized. No aortic stenosis is present.  6. The inferior vena cava is normal in size with greater than 50% respiratory variability, suggesting right atrial pressure of 3 mmHg. FINDINGS  Left Ventricle: 2:1 AV block throughout the study, but annular diastolic velocities are normal. Left ventricular ejection fraction, by estimation, is 60 to 65%. The left ventricle has normal function. The left ventricle has no regional wall motion abnormalities. The left ventricular internal cavity size was normal in size. There is no left ventricular hypertrophy. Left ventricular diastolic parameters were normal. Indeterminate filling pressures. Right Ventricle: The right ventricular size is normal. No increase in right ventricular wall thickness. Right ventricular systolic function is normal. There is mildly elevated pulmonary artery systolic pressure. The tricuspid regurgitant velocity is 2.66  m/s, and with an assumed right atrial pressure of 8 mmHg, the estimated right ventricular systolic pressure is 65.7 mmHg. Left Atrium: Left atrial size was normal in size. Right Atrium: Right atrial size was normal in size. Pericardium: There is no evidence of pericardial effusion. Mitral Valve: The mitral valve is normal in structure. Trivial mitral valve regurgitation. No evidence of mitral valve stenosis. MV peak gradient, 8.6 mmHg. The mean mitral valve gradient is 1.0 mmHg. Tricuspid Valve: The tricuspid valve is normal in structure. Tricuspid valve regurgitation is mild to moderate. No evidence of tricuspid stenosis. Aortic Valve: The aortic valve is normal in structure. Aortic valve regurgitation is not visualized. No aortic stenosis  is present. Aortic valve mean gradient measures 4.0 mmHg. Aortic valve peak gradient measures 10.4 mmHg. Aortic valve area, by VTI  measures 2.23 cm. Pulmonic Valve: The pulmonic valve was normal in structure. Pulmonic valve regurgitation is not visualized. No evidence of pulmonic stenosis. Aorta: The aortic root is normal in size and structure. Venous: The inferior vena cava is normal in size with greater than 50% respiratory variability, suggesting right atrial pressure of 3 mmHg. IAS/Shunts: No atrial level shunt detected by color flow Doppler.  LEFT VENTRICLE PLAX 2D LVIDd:         5.30 cm  Diastology LVIDs:         3.10 cm  LV e' medial:    13.30 cm/s LV PW:         1.00 cm  LV E/e' medial:  9.5 LV IVS:        1.10 cm  LV e' lateral:   18.90 cm/s LVOT diam:     2.00 cm  LV E/e' lateral: 6.7 LV SV:         80 LV SV Index:   42 LVOT Area:     3.14 cm  RIGHT VENTRICLE             IVC RV Basal diam:  3.40 cm     IVC diam: 1.80 cm RV S prime:     16.30 cm/s TAPSE (M-mode): 3.2 cm LEFT ATRIUM             Index       RIGHT ATRIUM           Index LA diam:        2.80 cm 1.47 cm/m  RA Area:     15.30 cm LA Vol (A2C):   53.8 ml 28.32 ml/m RA Volume:   39.30 ml  20.69 ml/m LA Vol (A4C):   49.5 ml 26.05 ml/m LA Biplane Vol: 54.9 ml 28.90 ml/m  AORTIC VALVE AV Area (Vmax):    2.07 cm AV Area (Vmean):   2.32 cm AV Area (VTI):     2.23 cm AV Vmax:           161.00 cm/s AV Vmean:          89.000 cm/s AV VTI:            0.359 m AV Peak Grad:      10.4 mmHg AV Mean Grad:      4.0 mmHg LVOT Vmax:         106.00 cm/s LVOT Vmean:        65.800 cm/s LVOT VTI:          0.255 m LVOT/AV VTI ratio: 0.71  AORTA Ao Root diam: 3.30 cm Ao Asc diam:  3.00 cm MITRAL VALVE                TRICUSPID VALVE MV Area (PHT): 2.82 cm     TR Peak grad:   28.3 mmHg MV Peak grad:  8.6 mmHg     TR Vmax:        266.00 cm/s MV Mean grad:  1.0 mmHg MV Vmax:       1.47 m/s     SHUNTS MV Vmean:      41.0 cm/s    Systemic VTI:  0.26 m MV Decel Time: 269 msec     Systemic Diam: 2.00 cm MV E velocity: 126.00 cm/s MV A velocity: 47.10 cm/s MV E/A ratio:  2.68 Mihai Croitoru MD  Electronically signed by Sanda Klein MD Signature Date/Time: 11/29/2020/1:01:44 PM    Final    Disposition   Pt is being discharged home today in good condition.  Follow-up Plans & Appointments     Follow-up Information    [facility] Follow up.   Why: on 5/31 at 0840 post ICD implantation  the office will call - Dr. Caryl Comes should be one to see you in [place] office.   Contact information: [place] [address] [phone]               Discharge Medications   Allergies as of 11/30/2020   No Known Allergies     Medication List    TAKE these medications   acetaminophen 325 MG tablet Commonly known as: TYLENOL Take 2 tablets (650 mg total) by mouth every 6 (six) hours as needed for mild pain, moderate pain, headache or fever.   vitamin B-12 500 MCG tablet Commonly known as: CYANOCOBALAMIN Take 1 tablet (500 mcg total) by mouth every morning.   Vitamin D-3 25 MCG (1000 UT) Caps Take 2 capsules (2,000 Units total) by mouth every morning. 2000          Outstanding Labs/Studies   Per Dr. Caryl Comes  Duration of Discharge Encounter   Greater than 30 minutes including physician time.  Signed, Cecilie Kicks, NP 11/30/2020, 11:30 AM

## 2020-12-02 ENCOUNTER — Encounter (HOSPITAL_COMMUNITY): Payer: Self-pay | Admitting: Internal Medicine

## 2020-12-02 LAB — LYME DISEASE DNA BY PCR(BORRELIA BURG): Lyme Disease(B.burgdorferi)PCR: NEGATIVE

## 2020-12-02 MED FILL — Lidocaine HCl Local Inj 1%: INTRAMUSCULAR | Qty: 40 | Status: AC

## 2020-12-03 ENCOUNTER — Telehealth: Payer: Self-pay | Admitting: Internal Medicine

## 2020-12-03 NOTE — Telephone Encounter (Signed)
Spoke with Dr. Caryl Comes and he would like to see patient in the [facility] office on Thursday 12/05/20 at 11:00 am. Patient has a rash/possible allergic reaction at her device site that has spread into her left shoulder/neck area. Patient was instructed to continue to use hydrocortisone per Dr. Caryl Comes. Patient has also been taking Benadryl. I advised patient that she could continue to take Benadryl as directed. Appointment scheduled for 11:00 am at the [place] office on 12/05/20. Routing to [employer] per Dr. Caryl Comes.

## 2020-12-03 NOTE — Telephone Encounter (Signed)
Patient states rash that started at her wound site and progressed up her neck. Patient is sending a picture by secure e-mail for review. Patient states that she used an anti itch cream that had menthol in it and had taken some benadryl for the rash.

## 2020-12-03 NOTE — Telephone Encounter (Signed)
  1. Has your device fired? No   2. Is you device beeping? No   3. Are you experiencing draining or swelling at device site? No drainage, but really red from rash and is also swollen  4. Are you calling to see if we received your device transmission? No   5. Have you passed out? No    Sheryl Gonzalez is calling stating she has a rash on her left side at her device site that started Sunday. She states when the rash first appeared it was very small, but it is now all up the left side of her neck, as well as around her incision sight and left breast. It has worsened more today than it was yesterday. She reports she has put some hydrocortisone on the rash and taken some benadryl for it. She is also wanting to know if her wound check can be rescheduled for the [place] office instead of [place]. Please advise.     Please route to [place]

## 2020-12-04 NOTE — Telephone Encounter (Signed)
Noted  

## 2020-12-05 ENCOUNTER — Other Ambulatory Visit: Payer: Self-pay

## 2020-12-05 ENCOUNTER — Encounter: Payer: Self-pay | Admitting: *Deleted

## 2020-12-05 ENCOUNTER — Ambulatory Visit (INDEPENDENT_AMBULATORY_CARE_PROVIDER_SITE_OTHER): Payer: BC Managed Care – PPO | Admitting: Internal Medicine

## 2020-12-05 ENCOUNTER — Encounter: Payer: Self-pay | Admitting: Internal Medicine

## 2020-12-05 VITALS — BP 120/90 | HR 64 | Ht 65.0 in | Wt 181.0 lb

## 2020-12-05 DIAGNOSIS — D869 Sarcoidosis, unspecified: Secondary | ICD-10-CM

## 2020-12-05 DIAGNOSIS — I442 Atrioventricular block, complete: Secondary | ICD-10-CM

## 2020-12-05 DIAGNOSIS — Z95810 Presence of automatic (implantable) cardiac defibrillator: Secondary | ICD-10-CM | POA: Diagnosis not present

## 2020-12-05 MED ORDER — PREDNISONE 10 MG (21) PO TBPK: ORAL_TABLET | ORAL | 0 refills | Status: DC

## 2020-12-05 NOTE — Patient Instructions (Signed)
Medication Instructions:  - Your physician has recommended you make the following change in your medication:   1) Start a Prednisone dose pack for your allergic reaction  *If you need a refill on your cardiac medications before your next appointment, please call your pharmacy*   Lab Work: - none ordered  If you have labs (blood work) drawn today and your tests are completely normal, you will receive your results only by: [name] MyChart Message (if you have MyChart) OR . A paper copy in the mail If you have any lab test that is abnormal or we need to change your treatment, we will call you to review the results.   Testing/Procedures: 1) Think about Sarcoid PET scanning options  - [facility] offers this  - [facility] offers this + follow up with the physican  - you can let the [facility] staff know your answer, if you are ready, when you have your wound check done on 12/10/20  - or you can call the [place] office [phone]/ send a MyChart message  Follow-Up: At [facility], you and your health needs are our priority.  As part of our continuing mission to provide you with exceptional heart care, we have created designated Provider Care Teams.  These Care Teams include your primary Cardiologist (physician) and Advanced Practice Providers (APPs -  Physician Assistants and Nurse Practitioners) who all work together to provide you with the care you need, when you need it.  We recommend signing up for the patient portal called "MyChart".  Sign up information is provided on this After Visit Summary.  MyChart is used to connect with patients for Virtual Visits (Telemedicine).  Patients are able to view lab/test results, encounter notes, upcoming appointments, etc.  Non-urgent messages can be sent to your provider as well.   To learn more about what you can do with MyChart, go to [email or web address].    Your next appointment:   As scheduled   The format for your next  appointment:   In Person  Provider:   as scheduled   Other Instructions n/a

## 2020-12-06 LAB — B. BURGDORFI ANTIBODIES

## 2020-12-06 LAB — MISC LABCORP TEST (SEND OUT): Labcorp test code: 164226

## 2020-12-10 ENCOUNTER — Other Ambulatory Visit: Payer: Self-pay

## 2020-12-10 ENCOUNTER — Ambulatory Visit (INDEPENDENT_AMBULATORY_CARE_PROVIDER_SITE_OTHER): Payer: BC Managed Care – PPO | Admitting: Emergency Medicine

## 2020-12-10 DIAGNOSIS — Z95810 Presence of automatic (implantable) cardiac defibrillator: Secondary | ICD-10-CM

## 2020-12-10 DIAGNOSIS — I442 Atrioventricular block, complete: Secondary | ICD-10-CM | POA: Diagnosis not present

## 2020-12-10 NOTE — Progress Notes (Addendum)
Wound check appointment. Dermabond removed. Wound without redness or edema. Incision edges approximated, wound well healed. Skin red with no drainage over left chest , Prednisone finished today. Normal device function. Thresholds, sensing, and impedances consistent with implant measurements. Device programmed at 3.5V for extra safety margin until 3 month visit. RV programmed subthreshold by Dr Caryl Comes at implant @ 0.05 mv.  Histogram distribution appropriate for patient and level of activity. No mode switches or ventricular arrhythmias noted. Patient educated about wound care, arm mobility, lifting restrictions, shock plan. ROV with Dr Caryl Comes 03/05/21. patient enrolled in remote f/u and next remote 03/04/21. Shock plan reviewed with patient. Patient prefers to have PET scan at [facility]. S/sx of infection reviewed with patient . She is to continue to take Benadryl as directed for itching and picture taken with patient permission.

## 2020-12-11 NOTE — Progress Notes (Signed)
Patient Care Team: [name], MD as PCP - General (Family Medicine) [name], MD as PCP - Cardiology (Cardiology) [name], MD as PCP - Electrophysiology (Cardiology)   HPI  Sheryl Gonzalez is a 55 y.o. female seen following ICD implantation for complete heart block this is abnormal cMRI consistent with sarcoid to discuss diagnostic trajectories.  She also has a rash over her device prep site.  No chest pain or shortness of breath   Records and Results Reviewed   Past Medical History:  Diagnosis Date  . Anxiety   . Biventricular ICD (implantable cardioverter-defibrillator) in place s/p placement 11/29/20 11/30/2020  . Colon polyps   . Iron deficiency anemia due to chronic blood loss   . Menorrhagia   . Sarcoidosis 11/30/2020  . Seasonal allergies   . Symptomatic bradycardia 11/30/2020  . Wears glasses     Past Surgical History:  Procedure Laterality Date  . BIV ICD INSERTION CRT-D N/A 11/29/2020   Procedure: BIV ICD INSERTION CRT-D;  Surgeon: [name], MD;  Location: [place] [facility];  Service: Cardiovascular;  Laterality: N/A;  . BREAST BIOPSY  2009   lt  . BREAST BIOPSY Left 10/12/2012   Procedure: LEFT NEEDLE LOCALIZATION EXCISIONAL BREAST BIOPSY;  Surgeon: Stark Francelia Mclaren, MD;  Location: [place];  Service: General;  Laterality: Left;  . BREAST DUCTAL SYSTEM EXCISION Left 10/12/2012   Procedure: MAJOR DUCTAL EXCISION;  Surgeon: Stark Haydyn Girvan, MD;  Location: [place];  Service: General;  Laterality: Left;  . COLONOSCOPY    . ROBOTIC ASSISTED BILATERAL SALPINGO OOPHERECTOMY Bilateral 06/26/2014   Procedure: ROBOTIC ASSISTED BILATERAL SALPINGECTOMY, RIGHT OOPHORECTOMY;  Surgeon: [name], MD;  Location: [facility];  Service: Gynecology;  Laterality: Bilateral;    Current Meds  Medication Sig  . acetaminophen (TYLENOL) 500 MG tablet Take 500 mg by mouth every 4 (four) hours as needed.  .  Cholecalciferol (VITAMIN D-3) 25 MCG (1000 UT) CAPS Take 2 capsules (2,000 Units total) by mouth every morning. 2000  . diphenhydrAMINE (BENADRYL) 25 mg capsule Take 25 mg by mouth every 4 (four) hours as needed for itching.  . predniSONE (STERAPRED UNI-PAK 21 TAB) 10 MG (21) TBPK tablet As directed  . vitamin B-12 (CYANOCOBALAMIN) 500 MCG tablet Take 1 tablet (500 mcg total) by mouth every morning.    Allergies  Allergen Reactions  . Chlorhexidine Gluconate Itching, Rash and Other (See Comments)    blisters      Review of Systems negative except from HPI and PMH  Physical Exam BP 120/90 (BP Location: Right Arm, Patient Position: Sitting, Cuff Size: Normal)   Pulse 64   Ht 5\' 5"  (1.651 m)   Wt 181 lb (82.1 kg)   SpO2 97%   BMI 30.12 kg/m  Well developed and well nourished in no acute distress HENT normal E scleral and icterus clear Neck Supple JVP flat; carotids brisk and full Clear to ausculation Rash over the site in the pattern of hibiclens exposure Regular rate and rhythm, no murmurs gallops or rub Soft with active bowel sounds No clubbing cyanosis {  Edema Alert and oriented, grossly normal motor and sensory function Skin Warm and Dry     Estimated Creatinine Clearance: 67 mL/min (by C-G formula based on SCr of 0.98 mg/dL).   Assessment and  Plan High grade heat block  cMRI abnormal concerning for Sarcoid  ICD  Rash in prep area  Patient has a  rash in her prep.;  This has not responded to topical steroids.  We will give her a steroid Dosepak  Lengthy discussion regarding the evaluation of her possible sarcoid.  That this would necessitate PET scanning.  This could be done either at [facility] or [facility] under the care of Dr. Burt Knack.  That the diagnosis of sarcoid would prompt immunosuppressive therapy with steroids and frequently adjunctive methotrexate and concomitant antibiotics.  She would like to think about it and we will get back with her next  week  39 min was spent in care of the patient including the review of records     Current medicines are reviewed at length with the patient today .  The patient does not  have concerns regarding medicines.

## 2020-12-12 ENCOUNTER — Telehealth: Payer: Self-pay | Admitting: Internal Medicine

## 2020-12-12 NOTE — Telephone Encounter (Signed)
Authorization obtained from our precert department for the patient PET scan. Good from 12/12/20-01/10/21.  Orders/ authorization/ records faxed to [facility] PET imaging department at [phone]. Confirmation received.   Waiting on call back from the imaging department for an appointment. We will then need to notify the patient.

## 2020-12-12 NOTE — Telephone Encounter (Signed)
Message received from Dr. Caryl Comes late 12/11/20 stating the patient had come to the [facility] office for her wound check yesterday and advised she would like to proceed with her Myocardial PET Sarcoid study at [facility].  I reached out to the patient today and notified her of the process we will follow to get this set up for her: 1) we submit to her insurance for authorization 2) once authorized, we will complete the orders for [facility] and send to them for scheduling 3) the [place] PET imaging department will call us with the appointment date/ time 4) we will call her with the apppointment date/ time (& a # to call and reschedule if needed) 5) [facility] PET imaging will call her 1-3 days prior to the appointment to go over her pre-test instructions and location of the test.  The patient voices understanding and is agreeable.   Staff message sent to pre-cert to obtain authorization #.

## 2020-12-20 ENCOUNTER — Telehealth: Payer: Self-pay | Admitting: Internal Medicine

## 2020-12-20 NOTE — Telephone Encounter (Signed)
Pt is calling back in regards to getting  an appt for a PET Scan at [facility], pt states she have not heard anything from our office since 12/12/20. Please advise

## 2020-12-23 NOTE — Telephone Encounter (Signed)
Luiz Blare O at 12/20/2020  4:19 PM  Status: Signed  Pt is calling back in regards to getting  an appt for a PET Scan at [facility], pt states she have not heard anything from our office since 12/12/20. Please advise

## 2020-12-23 NOTE — Telephone Encounter (Signed)
Previous encounter open for the same issue.  Will close this encounter. 

## 2020-12-27 NOTE — Telephone Encounter (Signed)
Call received back from [place] at [facility].  Per Ulice Dash, they are currently out to August scheduling patient's for these scans.  He will add the patient to a cancellation list but at this time cannot offer any sooner than her 01/29/21 appointment.  He did state their department just received a CNO for an additional scanner, but this will take some time to get up and going.  I confirmed with Ulice Dash that I should let patient's know it will be ~ 6-8 weeks prior to them getting there scan done once we submit the order.   Still waiting for feedback from out precert team prior to calling the patient.

## 2020-12-27 NOTE — Telephone Encounter (Signed)
I called and spoke with the [facility] department as I had not heard back from them.  Per Baxter Flattery, at [facility], the patient's study is currently scheduled for 01/29/21 at 11:45 am.  I have advised Baxter Flattery this will run outside of her authorization window of 01/10/21. Baxter Flattery directed me to speak with Ulice Dash- department supervisor to see if he could get her in sooner.  No answer- I had to leave a message for Ulice Dash advising him of the above and asked that he call me back.  I have also sent a staff message to Ciro Backer in precert to see what the process will be if the appointment can't be schedule any sooner, in regards to extending her authorization/ having to re-submit for this.  I will await feedback from [facility] prior to calling the patient back.

## 2020-12-27 NOTE — Telephone Encounter (Signed)
I spoke with the patient.  I have advised her that her appointment with [facility] is scheduled for 01/29/21 at 11:45 am.   The patient voices understanding and is agreeable.  She then advised that she had questions regarding her chest &  neck area post implant. She did have a severe allergic reaction to the surgical scrub. This required a prednisone dose pack.  She states that the area where she had the rash just shows some discoloration. She also notices an area of swelling to the left neck, just above the clavicle that has been present since her procedure. The area is not pitting, but noticeable to her.  She states when she presses on it, it flattens out, but then pops right back up after she takes her fingers off of it.  She denies any swelling to the left arm. She also denies any pain to the chest/ neck area. She is having some itching just around the incision for her implant, but states this is not bad.  I have advised her I will review with Dr. Caryl Comes and we will call her back. Will CC the device clinic as well.   The patient voices understanding and is agreeable.

## 2020-12-30 NOTE — Telephone Encounter (Signed)
Spoke with pt.  She reports there is some minor swelling. It is noticeable to her but not as pronounced as baseball/ door knob sized.  She does have some discomfort at night with clothing.  Advised that some swelling is normal and ice can help to reduce.  If hs e is concerned with amount of swelling, recommended to take a picture/ send via mychart.

## 2020-12-31 NOTE — Telephone Encounter (Signed)
She is now about a month out-- swelling may be concernign for more bleeding or infection  probably shouldsee her if this is getting worse  Thanks SK

## 2021-01-02 NOTE — Telephone Encounter (Signed)
1) To Device to see Dr. Olin Pia note regarding swelling near the device site.   2) I have also called received an updated authorization from our pre-cert team for the patient's Myocardial PETSarcoid Study:  Order Request Summary Health Plan: [place] Scheduled Date of Service: 01/29/2021 Order ID: [id number]  Authorized Approval Valid Through: 12/31/2020 - 01/29/2021  I have called and left a message for [facility] ([place] Counselor at [facility]) to advise her of the updated authorization. I have asked her to call me back to confirm she received this message.

## 2021-01-02 NOTE — Telephone Encounter (Signed)
Successful telephone encounter to Sheryl Gonzalez to schedule wound check appointment for pt c/o ongoing swelling just above ICD pocket ("above my clavicle"). Denies s/s of infection. Patient will be seen in device clinic 01/07/21 at 10:00 am. Patient is provided device clinic contact number if any additional questions or concerns should arise. Patient appreciative of call.

## 2021-01-07 ENCOUNTER — Other Ambulatory Visit: Payer: Self-pay

## 2021-01-07 ENCOUNTER — Ambulatory Visit (INDEPENDENT_AMBULATORY_CARE_PROVIDER_SITE_OTHER): Payer: BC Managed Care – PPO

## 2021-01-07 DIAGNOSIS — I442 Atrioventricular block, complete: Secondary | ICD-10-CM

## 2021-01-07 NOTE — Progress Notes (Signed)
Patient seen in device clinic today for swelling noted to left side of clavicle area. A small amount of swelling noted to the right clavicle area as well. Soft to palpate. No redness, warmth rashes or other obvious signs of infection. Dr. Curt Bears in to assess patient and recommended to continue to monitor and call if worsens. Patient has follow up 03/04/21 with Dr. Caryl Comes. Patient education completed with verbal understanding.

## 2021-01-27 NOTE — Telephone Encounter (Signed)
DPR on file. Lmom for the pt. Messaged Dr. Olin Pia nurse Nira Conn, RN who is out on vacay. She wanted the patient to know that [facility] will reach out to her 2 days or so prior to her scheduled appt to go over pre-test instructions and to give directions to their location. Left their telephone number on the pt voicemail if the patient wants to contact them directly.  [facility] [address] [place], [place] [address] [phone]

## 2021-01-27 NOTE — Telephone Encounter (Signed)
Pt is calling back in to get the number and the directions to where she needs to go

## 2021-01-29 DIAGNOSIS — R9439 Abnormal result of other cardiovascular function study: Secondary | ICD-10-CM | POA: Diagnosis not present

## 2021-01-29 DIAGNOSIS — D8685 Sarcoid myocarditis: Secondary | ICD-10-CM | POA: Diagnosis not present

## 2021-02-05 ENCOUNTER — Encounter: Payer: Self-pay | Admitting: Internal Medicine

## 2021-02-05 ENCOUNTER — Telehealth: Payer: Self-pay | Admitting: Internal Medicine

## 2021-02-05 NOTE — Telephone Encounter (Signed)
PET results    1- Cardiac PET/CT Metabolic Study with 99991111 FDG 14.9 mCi given IV and Scanned on PET Discovery MI:   There are segments with abnormal metabolism to indicate presence of inflammation/active inflammatory process in the LV   myocardium. This involve about 53% of the septal wall and 17% of the LV.  There is also moderate increased F-18 FDG in the RV   free wall consistent with active inflammatory process. Findings may representSarcoidosis or AIC.    With the cMRI scan, this is most consistent with sarcoid and will reach out to colleagues to set up plan for next steps of therapy This will include prednisone for some time as well as methotrexate and folic acid to allow for coming off the prednisone, but the methotrexate will take a numbers of weeks to come into effect  Prednisone  30 mg day . Weeks 1-4 MTX 10 mg once/week weeks 1-2 Bactrim DS  1 MWF   while pred > 15 mgs \\ TO FOLLOW IMMUNZATION updates   Thanks SK

## 2021-02-05 NOTE — Telephone Encounter (Signed)
Follow Up:     Patient would like to know if Dr Caryl Comes have received her PET Scan results from [facility] and if so, what are the results please?

## 2021-02-07 NOTE — Telephone Encounter (Signed)
Attempted phone call to pt.  Left voicemail to contact RN at [phone]. 

## 2021-02-07 NOTE — Telephone Encounter (Signed)
Follow up:   Patient returning a call back.  

## 2021-02-07 NOTE — Telephone Encounter (Signed)
Pt advised PET results have been received and waiting for Dr Caryl Comes to review.  Pt advised Dr Caryl Comes is out of the office this week and next week but once reviewed will contact pt.  Pt verbalizes understanding and agrees with current plan.

## 2021-02-11 ENCOUNTER — Encounter: Payer: Self-pay | Admitting: Internal Medicine

## 2021-02-11 NOTE — Progress Notes (Unsigned)
Called and spoke with patient 1) abnormal PET supports the diagnosis of Sarcoid 2) reviewed the protocol with her, the need for vaccines, Abx and the weaning protocols;  immunosuppression begins 2 weeks after the vaccines are given.  3) reached out to CHF clinic, they will assume the care long term and we will make the appt 4) will print out a copy of the protocol for Ms KO  5) told me her sister died 2023/02/09 and her son and husband within the year     FINAL COMMENTS   Metabolism    1- Cardiac PET/CT Metabolic Study with 99991111 FDG 14.9 mCi given IV and Scanned on PET Discovery MI:   There are segments with abnormal metabolism to indicate presence of inflammation/active inflammatory process in the LV   myocardium. This involve about 53% of the septal wall and 17% of the LV.  There is also moderate increased F-18 FDG in the RV   free wall consistent with active inflammatory process. Findings may represent Sarcoidosis or AIC.    Perfusion/Function    1- Gated Cardiac PET/CT Rest Myocardial Perfusion Study with Rb-82 Reveals Abnormal Perfusion to The Myocardium        Involving About 10% of The Interventricular Septum. All Segments Demonstrate Hypermetabolism And Therefore        Should Not Be Consider Scar.   2- Normal Left Ventricular Ejection Fraction at Rest as Well as Left Ventricular Volumes. Normal Left Ventricular Systolic       Function, Abnormal Wall Thickening and Wall Motion.   3- No Evidence of Increased Tracer Uptake Rb-82 in The Right Ventricular Free Wall to Suggest RVH.    2- Cardiac CT Viewer: No Evidence of Coronary Artery Calcifications.    3- Limited View of Lower Chest and Upper Abdomen PET/CT: No Evidence of Extra Cardiac Areas in The Chest of   Hypermetabolism to Suggest Extra Cardiac Sarcoidosis/Reactive Lymphnodes onThe Limited [employer].

## 2021-02-17 ENCOUNTER — Telehealth: Payer: Self-pay | Admitting: Internal Medicine

## 2021-02-17 DIAGNOSIS — D869 Sarcoidosis, unspecified: Secondary | ICD-10-CM

## 2021-02-17 NOTE — Telephone Encounter (Signed)
Noted Thanks SK   

## 2021-02-17 NOTE — Telephone Encounter (Signed)
Dr. Caryl Comes has emailed me the protocol for treatment of Sarcoidosis. He also advised me late last week that he has spoken with Dr. Aundra Dubin at the [facility] regarding this patient and her recent + myocardial PET sarcoid study.  Per Dr. Aundra Dubin, this patient is a good candidate for them to follow and treat.  I attempted to call the patient back. No answer- No DPR on file for [name] to leave detailed message, but I did advise I am sending the protocol to her for treatment of sarcoid and will send more details to her as well in this message.  I asked that she please call back with any further questions/ concerns, or let me know by MyChart that she would like a call back.

## 2021-02-17 NOTE — Telephone Encounter (Signed)
Sheryl Gonzalez, Sheryl Gonzalez - 02/17/2021 11:47 AM [name], MD  Sent: Mon February 17, 2021  1:39 PM  To: [name], RN; Six, [name], LPN  Cc: [name], MD          Message  Fabio Pierce,  Sure we can.They don't recommend meningitis or H.influenza unless a person has their spleen removed.  For patients with autoimmune disease about to start immunosuppressives, they recommend Pneumovax and shingrix plus seasonal flu shot.    [name],  Patient would need Pneumovax 23 and Shingrix prior to starting immunosuppressive therapy.  I'd recommend a flu shot this fall and a booster on her covid vaccine as well if she has not had the third shot.   [name]

## 2021-02-17 NOTE — Telephone Encounter (Signed)
Patient following up on mychart message she sent. She states that the results from her PET scan was a lot for her to digest when she spoke with Dr. Caryl Comes and would like to have a nurse contact her back to discuss further.

## 2021-02-17 NOTE — Telephone Encounter (Signed)
MyChart message received from the patient today as stated below:  Good morning.  Dr. Caryl Comes has communicated the Pet Scan results of having sarcoidosis.  He mentioned a treatment plan protocol and I requested a copy of this protocol as to fully understand the path and timing for the various stages of the treatment.   He mentioned that I will need to get the shingles, pneumonia and flu shots now.  I will contact my primary care physician to see the timing to get these three shots.   I would like for a nurse to call me to discuss the info above.   ThanksKoryna, Sheryl Gonzalez [phone]

## 2021-02-17 NOTE — Telephone Encounter (Signed)
See previous encounter from today that was opened for the same issue.

## 2021-02-17 NOTE — Telephone Encounter (Signed)
Call placed to patient.   Advised that Shingles vaccine will need to be obtained at pharmacy. Advised that Pneumonia vaccine can also be obtained at pharmacy. Reports that she will begin vaccines with pharmacy and follow up with PCP.

## 2021-02-17 NOTE — Telephone Encounter (Signed)
The patient called back and confirmed she received my MyChart message. We discussed questions that she had concerning the treatment plan for her newly diagnosed sarcoidosis.   She advised she was very overwhelmed when Dr. Caryl Gonzalez reached out to her with her PET results last week. She was very appreciative of the detailed treatment protocol we sent her today.  I have advised her I will reach out to her PCP (Dr. Dennard Schaumann) to see if they can assist with getting her vaccines updated so we can begin her treatment process.  She will let me know through MyChart when she receives all 3 initial vaccines as per the protocol.  I have advised her at that point, we can start her on her initial prednisone/ methotrexate if she does not have an appointment with Dr. Aundra Dubin.  She is aware I have reached out to Dr. Claris Gladden nurse to assist with an appointment.  The patient is also aware I am mailing her some education material for sarcoidosis.  She was very appreciative of the call back.   Will route this call to Dr. Dennard Schaumann as well- treatment protocol attached below.  Immunosuppressive Therapy for treatment of Sarcoidosis:  Immunizations (to be administered through your Primary Care Provider) : Must NOT start immunosuppression until at least 2 weeks AFTER getting the initial shots  1. Pneumonia: Prevnar (PCV13) followed 2 months later by Pneumovax (PPSV23)  2. Influenza: Annual flu shot  3. Varicella zoster (shingles): Shingrix followed 2 months later by another Shingrix shot    Then Methotrexate plus Prednisone  Starting:_________________________  Prednisone  1. Weeks 1 -4: 30 mg per day  2. Weeks 5-8: 25 mg per day  3. Weeks 9-12: 20 mg per day  4. Weeks 13-16: 15 mg per day  5. Weeks 17 & 18: 10 mg per day; discontinue Atovaquone  6. Weeks 19 & 20: 7.5 mg per day  7. Weeks 21 & 22: 5 mg per day  8. Weeks 23 & 24: 2.5 mg per day  9. Week 25: Discontinue  prednisone   Methotrexate  1. Weeks 1 and 2: 10 mg (four 2.5 mg tablets) once weekly  2. Weeks 3 and 4: 12.5 mg (five 2.5 mg tablets) once weekly  3. Weeks 5 and 6: 15 mg (six 2.5 mg tablets) once weekly  4. Weeks 7 and 8: 17.5 mg (seven 2.5 mg tablets) once weekly  5. Weeks 9+: 20 mg (eight 2.5 mg tablets) once weekly ongoing    Additional Medications  1. Atovaquone (to prevent lung infection while taking prednisone): 750 mg/40m suspension; 5 ml twice daily as long as you are taking ?15 mg prednisone daily  2. Folic Acid (to preserve blood cell counts while taking Methotrexate): 1 mg every day  3. Calcium (to protect bone strength while taking prednisone): 1200-1500 mg total daily, calcium citrate preferred; buy over the counter  4. Vitamin D (to protect bone strength while taking prednisone): [phone] IU total daily; buy over the counter  5. Omeprazole [(Prilosec) to protect stomach while taking prednisone]: 20 mg every day while taking any dose of prednisone; may buy over the counter if less expensive than prescription    Monitoring  1. Blood tests: CBC (complete blood count with differential), AST and ALT (liver function tests), creatinine (kidney function test), fasting blood glucose  a. At the end of month 1  b. At the end of month 2  c. At the end of month 3  d. Every three months thereafter

## 2021-02-18 ENCOUNTER — Other Ambulatory Visit: Payer: Self-pay | Admitting: *Deleted

## 2021-02-18 ENCOUNTER — Encounter: Payer: Self-pay | Admitting: Family Medicine

## 2021-02-18 MED ORDER — FOLIC ACID 1 MG PO TABS: 1.0000 mg | ORAL_TABLET | Freq: Every day | ORAL | 6 refills | Status: DC

## 2021-02-18 MED ORDER — INFLUENZA VAC RECOM HA QUAD PF 0.5 ML IM SOSY: 0.5000 mL | PREFILLED_SYRINGE | Freq: Once | INTRAMUSCULAR | 0 refills | Status: AC

## 2021-02-18 MED ORDER — PNEUMOVAX 23 25 MCG/0.5ML IJ INJ: 0.5000 mL | INJECTION | INTRAMUSCULAR | 0 refills | Status: AC

## 2021-02-18 MED ORDER — OMEPRAZOLE 20 MG PO CPDR: 20.0000 mg | DELAYED_RELEASE_CAPSULE | Freq: Every day | ORAL | 6 refills | Status: DC

## 2021-02-18 MED ORDER — SHINGRIX 50 MCG/0.5ML IM SUSR: 0.5000 mL | Freq: Once | INTRAMUSCULAR | 1 refills | Status: AC

## 2021-03-04 ENCOUNTER — Encounter: Payer: BC Managed Care – PPO | Admitting: Internal Medicine

## 2021-03-05 ENCOUNTER — Ambulatory Visit (INDEPENDENT_AMBULATORY_CARE_PROVIDER_SITE_OTHER): Payer: BC Managed Care – PPO

## 2021-03-05 DIAGNOSIS — I442 Atrioventricular block, complete: Secondary | ICD-10-CM | POA: Diagnosis not present

## 2021-03-05 LAB — CUP PACEART REMOTE DEVICE CHECK
Battery Remaining Longevity: 58 mo
Battery Remaining Percentage: 91 %
Battery Voltage: 3.11 V
Brady Statistic AP VP Percent: 18 %
Brady Statistic AP VS Percent: 1 %
Brady Statistic AS VP Percent: 82 %
Brady Statistic AS VS Percent: 1 %
Brady Statistic RA Percent Paced: 18 %
Date Time Interrogation Session: 20220824020014
HighPow Impedance: 66 Ohm
HighPow Impedance: 66 Ohm
Implantable Lead Implant Date: 20220520
Implantable Lead Implant Date: 20220520
Implantable Lead Implant Date: 20220520
Implantable Lead Location: 753858
Implantable Lead Location: 753859
Implantable Lead Location: 753860
Implantable Lead Model: 3830
Implantable Pulse Generator Implant Date: 20220520
Lead Channel Impedance Value: 460 Ohm
Lead Channel Impedance Value: 490 Ohm
Lead Channel Impedance Value: 630 Ohm
Lead Channel Pacing Threshold Amplitude: 0.5 V
Lead Channel Pacing Threshold Amplitude: 0.75 V
Lead Channel Pacing Threshold Amplitude: 0.75 V
Lead Channel Pacing Threshold Pulse Width: 0.5 ms
Lead Channel Pacing Threshold Pulse Width: 0.5 ms
Lead Channel Pacing Threshold Pulse Width: 0.5 ms
Lead Channel Sensing Intrinsic Amplitude: 10.3 mV
Lead Channel Sensing Intrinsic Amplitude: 3.3 mV
Lead Channel Setting Pacing Amplitude: 0.25 V
Lead Channel Setting Pacing Amplitude: 3.5 V
Lead Channel Setting Pacing Amplitude: 3.5 V
Lead Channel Setting Pacing Pulse Width: 0.05 ms
Lead Channel Setting Pacing Pulse Width: 0.5 ms
Lead Channel Setting Sensing Sensitivity: 0.5 mV
Pulse Gen Serial Number: 9956643

## 2021-03-10 NOTE — Progress Notes (Signed)
Patient Care Team: [name], MD as PCP - General (Family Medicine) [name], MD as PCP - Cardiology (Cardiology) [name], MD as PCP - Electrophysiology (Cardiology)   HPI  Sheryl Gonzalez is a 55 y.o. female seen following ICD implantation for complete heart block this is abnormal cMRI consistent with sarcoid to discuss diagnostic trajectories.  S  PET at [facility] --Hypermetabolism and abnormal perfusion consistent with [facility] Sarcoidosis   Have reviewed with her the [facility] protocol, she has gotten her vaccines and will begin therapy today with MTX and Prednisone  The patient denies chest pain, nocturnal dyspnea, orthopnea or peripheral edema.  There have been no palpitations, lightheadedness or syncope.  Complains of fatigue and dyspnea.   Records and Results Reviewed   Past Medical History:  Diagnosis Date   Anxiety    Biventricular ICD (implantable cardioverter-defibrillator) in place s/p placement 11/29/20 11/30/2020   Colon polyps    Iron deficiency anemia due to chronic blood loss    Menorrhagia    Sarcoidosis 11/30/2020   Seasonal allergies    Symptomatic bradycardia 11/30/2020   Wears glasses     Past Surgical History:  Procedure Laterality Date   BIV ICD INSERTION CRT-D N/A 11/29/2020   Procedure: BIV ICD INSERTION CRT-D;  Surgeon: [name], MD;  Location: [place] [facility];  Service: Cardiovascular;  Laterality: N/A;   BREAST BIOPSY  2009   lt   BREAST BIOPSY Left 10/12/2012   Procedure: LEFT NEEDLE LOCALIZATION EXCISIONAL BREAST BIOPSY;  Surgeon: Stark Chabely Norby, MD;  Location: [place];  Service: General;  Laterality: Left;   BREAST DUCTAL SYSTEM EXCISION Left 10/12/2012   Procedure: MAJOR DUCTAL EXCISION;  Surgeon: Stark Ricardo Kayes, MD;  Location: [place];  Service: General;  Laterality: Left;   COLONOSCOPY     ROBOTIC ASSISTED BILATERAL SALPINGO OOPHERECTOMY Bilateral 06/26/2014   Procedure: ROBOTIC  ASSISTED BILATERAL SALPINGECTOMY, RIGHT OOPHORECTOMY;  Surgeon: [name], MD;  Location: [facility];  Service: Gynecology;  Laterality: Bilateral;    Current Meds  Medication Sig   acetaminophen (TYLENOL) 500 MG tablet Take 500 mg by mouth every 4 (four) hours as needed.   Cholecalciferol (VITAMIN D-3) 25 MCG (1000 UT) CAPS Take 2 capsules (2,000 Units total) by mouth every morning. 2000   diphenhydrAMINE (BENADRYL) 25 mg capsule Take 25 mg by mouth every 4 (four) hours as needed for itching.   folic acid (FOLVITE) 1 MG tablet Take 1 tablet (1 mg total) by mouth daily.   omeprazole (PRILOSEC) 20 MG capsule Take 1 capsule (20 mg total) by mouth daily.   vitamin B-12 (CYANOCOBALAMIN) 500 MCG tablet Take 1 tablet (500 mcg total) by mouth every morning.    Allergies  Allergen Reactions   Chlorhexidine Gluconate Itching, Rash and Other (See Comments)    blisters      Review of Systems negative except from HPI and PMH  Physical Exam BP 140/84 (BP Location: Left Arm, Patient Position: Sitting, Cuff Size: Normal)   Pulse 77   Ht '5\' 5"'$  (1.651 m)   Wt 185 lb (83.9 kg)   SpO2 96%   BMI 30.79 kg/m  Well developed and well nourished in no acute distress HENT normal Neck supple with JVP-flat Clear Device pocket well healed; without hematoma or erythema.  There is no tethering  Regular rate and rhythm, no gallop No / murmur Abd-soft with active BS No Clubbing cyanosis  edema Skin-warm and dry  A & Oriented  Grossly normal sensory and motor function  ECG sinus with P synchronous pacing qR in lead V1 Interval 17/14/41      CrCl cannot be calculated (Patient's most recent lab result is older than the maximum 21 days allowed.).   Assessment and  Plan High grade heat block  cMRI/PET abnormal concerning for Sarcoid  ICD-CRT-Saint Jude  Rash in prep area  Still a small rash over the device.  No other signs to suggest infection  Euvolemic.  Lengthy discussion today we will  begin her on methotrexate 10 mg weekly x2 weeks and then 12.5 mg weekly x2 weeks with gradual titration towards 20 mg per protocol with concomitant folic acid and prednisone 30 mg x 4 weeks followed by 25 mg x 4 weeks and trimethoprim/sulfamethoxazole 1 tablet 3 times per week   Current medicines are reviewed at length with the patient today .  The patient does not  have concerns regarding medicines.

## 2021-03-11 ENCOUNTER — Ambulatory Visit (INDEPENDENT_AMBULATORY_CARE_PROVIDER_SITE_OTHER): Payer: BC Managed Care – PPO | Admitting: Internal Medicine

## 2021-03-11 ENCOUNTER — Encounter: Payer: Self-pay | Admitting: Internal Medicine

## 2021-03-11 ENCOUNTER — Other Ambulatory Visit: Payer: Self-pay

## 2021-03-11 VITALS — BP 140/84 | HR 77 | Ht 65.0 in | Wt 185.0 lb

## 2021-03-11 DIAGNOSIS — I442 Atrioventricular block, complete: Secondary | ICD-10-CM

## 2021-03-11 DIAGNOSIS — D869 Sarcoidosis, unspecified: Secondary | ICD-10-CM | POA: Diagnosis not present

## 2021-03-11 DIAGNOSIS — Z79899 Other long term (current) drug therapy: Secondary | ICD-10-CM

## 2021-03-11 DIAGNOSIS — R001 Bradycardia, unspecified: Secondary | ICD-10-CM

## 2021-03-11 MED ORDER — SULFAMETHOXAZOLE-TRIMETHOPRIM 800-160 MG PO TABS: ORAL_TABLET | ORAL | 3 refills | Status: DC

## 2021-03-11 MED ORDER — PREDNISONE 10 MG PO TABS: ORAL_TABLET | ORAL | 0 refills | Status: DC

## 2021-03-11 MED ORDER — METHOTREXATE 2.5 MG PO TABS: ORAL_TABLET | ORAL | 0 refills | Status: DC

## 2021-03-11 NOTE — Patient Instructions (Addendum)
Medication Instructions:  - Your physician has recommended you make the following change in your medication:   1) Immunosuppressive Therapy (Prednisone & Methotrexate) : per protocol- see below  *If you need a refill on your cardiac medications before your next appointment, please call your pharmacy*   Lab Work: - Your physician recommends that you return for lab work in: 1 month- CMET/ CBC (please come FASTING)  If you have labs (blood work) drawn today and your tests are completely normal, you will receive your results only by: [place] (if you have MyChart) OR A paper copy in the mail If you have any lab test that is abnormal or we need to change your treatment, we will call you to review the results.   Testing/Procedures: - none ordered   Follow-Up: At [facility], you and your health needs are our priority.  As part of our continuing mission to provide you with exceptional heart care, we have created designated Provider Care Teams.  These Care Teams include your primary Cardiologist (physician) and Advanced Practice Providers (APPs -  Physician Assistants and Nurse Practitioners) who all work together to provide you with the care you need, when you need it.  We recommend signing up for the patient portal called "MyChart".  Sign up information is provided on this After Visit Summary.  MyChart is used to connect with patients for Virtual Visits (Telemedicine).  Patients are able to view lab/test results, encounter notes, upcoming appointments, etc.  Non-urgent messages can be sent to your provider as well.   To learn more about what you can do with MyChart, go to [email or web address].    Your next appointment:   1) Dr. Claris Gladden office should reach out to you for an appointment to be seen in the next 3-4 weeks  2) 9 months with Dr. Caryl Comes  The format for your next appointment:   In Person  Provider:   As Above   Other Instructions  Immunosuppressive Therapy for  treatment of Sarcoidosis:  Immunizations (to be administered through your Primary Care Provider) : Must NOT start immunosuppression until at least 2 weeks AFTER getting the initial shots  1. Pneumonia: Prevnar (PCV13) followed 2 months later by Pneumovax (PPSV23)- (done- 02/19/21)  2. Influenza: Annual flu shot (done- 02/21/21)   3. Varicella zoster (shingles): Shingrix followed 2 months later by another Shingrix shot (done- 02/21/21)   START Date:_____________________________  Prednisone Weeks 1 -4:  30 mg per day  Weeks 5-8: 25 mg per day  Weeks 9-12: 20 mg per day  Weeks 13-16: 15 mg per day  Weeks 17 & 18: 10 mg per day; discontinue Bactrim  Weeks 19 & 20: 7.5 mg per day  Weeks 21 & 22: 5 mg per day  Weeks 23 & 24: 2.5 mg per day  Week 25: Discontinue prednisone   Methotrexate Weeks 1 and 2: 10 mg (four 2.5 mg tablets) once weekly  Weeks 3 and 4: 12.5 mg (five 2.5 mg tablets) once weekly  Weeks 5 and 6: 15 mg (six 2.5 mg tablets) once weekly  Weeks 7 and 8: 17.5 mg (seven 2.5 mg tablets) once weekly  Weeks 9+: 20 mg (eight 2.5 mg tablets) once weekly ongoing    Additional Medications Bactrim DS (to prevent lung infection while taking prednisone):  1 tablet every Monday, Wednesday and Friday while taking ?15 mg of prednisone daily  Folic Acid (to preserve blood cell counts while taking Methotrexate):  1 mg every day   Calcium (to  protect bone strength while taking prednisone):  1200-1500 mg total daily, calcium citrate preferred; buy over the counter  Vitamin D (to protect bone strength while taking prednisone):  [phone] IU total daily; buy over the counter  Omeprazole [(Prilosec) to protect stomach while taking prednisone]:  20 mg every day while taking any dose of prednisone; may buy over the counter if less expensive than prescription   Monitoring  Blood tests: CBC (complete blood count with differential), AST and ALT (liver function tests), creatinine  (kidney function test), fasting blood glucose  At the end of month 1  At the end of month 2  At the end of month 3  Every three months thereafter

## 2021-03-20 ENCOUNTER — Ambulatory Visit (HOSPITAL_COMMUNITY)
Admission: RE | Admit: 2021-03-20 | Discharge: 2021-03-20 | Disposition: A | Discharge status: Home / Self Care | Payer: BC Managed Care – PPO | Source: Ambulatory Visit | Attending: Cardiology | Admitting: Cardiology

## 2021-03-20 ENCOUNTER — Other Ambulatory Visit: Payer: Self-pay

## 2021-03-20 ENCOUNTER — Encounter (HOSPITAL_COMMUNITY): Payer: Self-pay | Admitting: Cardiology

## 2021-03-20 VITALS — BP 148/70 | HR 71 | Wt 187.4 lb

## 2021-03-20 DIAGNOSIS — D869 Sarcoidosis, unspecified: Secondary | ICD-10-CM | POA: Diagnosis not present

## 2021-03-20 DIAGNOSIS — Z95810 Presence of automatic (implantable) cardiac defibrillator: Secondary | ICD-10-CM | POA: Insufficient documentation

## 2021-03-20 DIAGNOSIS — Z79899 Other long term (current) drug therapy: Secondary | ICD-10-CM | POA: Insufficient documentation

## 2021-03-20 DIAGNOSIS — I442 Atrioventricular block, complete: Secondary | ICD-10-CM | POA: Insufficient documentation

## 2021-03-20 LAB — BASIC METABOLIC PANEL
Anion gap: 9 (ref 5–15)
BUN: 18 mg/dL (ref 6–20)
CO2: 28 mmol/L (ref 22–32)
Calcium: 9.6 mg/dL (ref 8.9–10.3)
Chloride: 104 mmol/L (ref 98–111)
Creatinine, Ser: 1.07 mg/dL — ABNORMAL HIGH (ref 0.44–1.00)
GFR, Estimated: >60 mL/min (ref >60)
Glucose, Bld: 113 mg/dL — ABNORMAL HIGH (ref 70–99)
Potassium: 3.9 mmol/L (ref 3.5–5.1)
Sodium: 141 mmol/L (ref 135–145)

## 2021-03-20 MED ORDER — METHOTREXATE 2.5 MG PO TABS: 20.0000 mg | ORAL_TABLET | ORAL | 6 refills | Status: DC

## 2021-03-20 MED ORDER — CARVEDILOL 3.125 MG PO TABS: 3.1250 mg | ORAL_TABLET | Freq: Two times a day (BID) | ORAL | 3 refills | Status: DC

## 2021-03-20 MED ORDER — METHOTREXATE 2.5 MG PO TABS: ORAL_TABLET | ORAL | 0 refills | Status: DC

## 2021-03-20 NOTE — Progress Notes (Signed)
PCP: Sheryl Frizzle, MD EP: Dr. Caryl Comes HF cardiology: Dr. Aundra Dubin  55 y.o. with history of complete heart block s/p St Jude BiV ICD placement and suspected cardiac sarcoidosis was referred by Dr. Caryl Comes for management of cardiac sarcoidosis. Patient generally has been in good health, no major medical problems until 5/22.  In 5/22, she developed "dizzy spells."  She would check her pulse and find it to be in the 30s.  She did not pass out.  She had an ECG at her PCP's office showing complete heart block and was admitted.  Echo showed normal LV systolic function.  Cardiac MRI was done due to concern for possible cardiac sarcoidosis as cause of CHB in a relatively young woman.  Cardiac MRI showed a mid-wall septal LGE pattern with elevated septal T2 signal that was concerning for cardiac sarcoidosis.  Patient had implantation of St Jude CRT-D device.  She then had a cardiac PET at [facility] which showed inflammation in the septum and the RV free wall along with a septal perfusion defect.  She was seen by Dr. Caryl Comes and was started on prednisone and methotrexate for treatment of high probability cardiac sarcoidosis.   She currently seems to be doing well.  No exertional dyspnea or chest pain.  No palpitations.  She is tolerating prednisone without problems so far.  No lightheadedness/syncope.  She has no history of rash, cough, or joint pain.  She has no eye problems and recently saw her eye doctor with no concerning findings.    ECG (8/22, personally reviewed): NSR, BiV pacing  Labs (5/22): K 4, creatinine 0.98  PMH: 1. H/o Shingles 2. Ovarian cyst 3. Complete heart block: Thought to be due to cardiac sarcoidosis, s/p placement of St Jude CRT-D device.  4. Cardiac sarcoidosis: Strong suspicion.  Patient presented in 5/22 with complete heart block.  - Echo (5/22): EF 60-65%, normal RV, PASP 36 mmHg.  - Cardiac MRI (5/22): LV EF 64%, increased T2 signal in septum, mid-wall septal LGE (non-MI pattern), RV EF  61%.  - Cardiac PET (7/22): LV EF 57%, inflammation in 57% of the septal wall and 17% of the total LV, inflammation also involved the RV free wall.  There was a septal perfusion defect. This was concerning for possible cardiac sarcoidosis.   SH: Single, [place] from [place] originally, now lives in [place], nonsmoker, no ETOH/drugs, working full time.   FH: No family history of sarcoidosis or cardiomyopathy.   ROS: All systems reviewed and negative except as per HPI.   Current Outpatient Medications  Medication Sig Dispense Refill   acetaminophen (TYLENOL) 500 MG tablet Take 500 mg by mouth every 4 (four) hours as needed.     carvedilol (COREG) 3.125 MG tablet Take 1 tablet (3.125 mg total) by mouth 2 (two) times daily. 60 tablet 3   Cholecalciferol (VITAMIN D-3) 25 MCG (1000 UT) CAPS Take 2 capsules (2,000 Units total) by mouth every morning. 2000 60 capsule    diphenhydrAMINE (BENADRYL) 25 mg capsule Take 25 mg by mouth every 4 (four) hours as needed for itching.     folic acid (FOLVITE) 1 MG tablet Take 1 tablet (1 mg total) by mouth daily. 30 tablet 6   methotrexate (RHEUMATREX) 2.5 MG tablet Take 4 tablets (10 mg) by mouth once a week (weeks 1 & 2), then take 5 tablets (12.5 mg) by mouth once a week (weeks 3 & 4) Caution:Chemotherapy. Protect from light. 18 tablet 0   methotrexate (RHEUMATREX) 2.5 MG  tablet Take 6 tabs (15 mg) once a week (week 5 & 6), Take 7 tabs (17.5 mg) once a week (weeks 7 & 8) Caution:Chemotherapy. Protect from light. 26 tablet 0   [START ON 05/14/2021] methotrexate (RHEUMATREX) 2.5 MG tablet Take 8 tablets (20 mg total) by mouth once a week. Caution:Chemotherapy. Protect from light. 32 tablet 6   omeprazole (PRILOSEC) 20 MG capsule Take 1 capsule (20 mg total) by mouth daily. 30 capsule 6   predniSONE (DELTASONE) 10 MG tablet Take 3 tablets (30 mg) by mouth once daily for 4 weeks 90 tablet 0   sulfamethoxazole-trimethoprim (BACTRIM DS) 800-160 MG tablet 1  tablet (800-160) every Monday, Wednesday and Friday while taking 15 mg or more of prednisone daily 12 tablet 3   vitamin B-12 (CYANOCOBALAMIN) 500 MCG tablet Take 1 tablet (500 mcg total) by mouth every morning.     No current facility-administered medications for this encounter.   BP (!) 148/70   Pulse 71   Wt 85 kg (187 lb 6.4 oz)   SpO2 100%   BMI 31.18 kg/m  General: NAD Neck: No JVD, no thyromegaly or thyroid nodule.  Lungs: Clear to auscultation bilaterally with normal respiratory effort. CV: Nondisplaced PMI.  Heart regular S1/S2, no S3/S4, no murmur.  No peripheral edema.  No carotid bruit.  Normal pedal pulses.  Abdomen: Soft, nontender, no hepatosplenomegaly, no distention.  Skin: Intact without lesions or rashes.  Neurologic: Alert and oriented x 3.  Psych: Normal affect. Extremities: No clubbing or cyanosis.  HEENT: Normal.   Assessment/Plan: 1. Complete heart block: In a young woman, there was concern for cardiac sarcoidosis as the cause.  Workup with cMRI and cPET was highly concerning for cardiac sarcoidosis.  She now has a [employer] CRT-D device.   - Dr. Caryl Comes follows CRT-D device.  2. Cardiac sarcoidosis: Strong suspicion for cardiac sarcoidosis in a relatively young woman with complete heart block and both cMRI and cPET that are consistent with cardiac sarcoidosis.  She does not have evidence for extracardiac sarcoidosis; 25% of cardiac sarcoidosis, however, is isolated to the heart.  No skin lesions and recent exam by eye doctor was unremarkable per her report.  No pulmonary symptoms but has not had CT chest.   We do not have a tissue diagnosis, but endomyocardial biopsy has a relatively low sensitivity in cardiac sarcoidosis.  Given high probability CS, think we can treat based on the data that we have.  Will look for extracardiac sites that could be biopsied.  - I will arrange for high resolution CT chest to assess for evidence of pulmonary sarcoidosis.  - Check ACE  level.  - I will refer to rheumatology (Dr. Amil Amen) given strong suspicion for sarcoidosis.  - Treatment using Mayo protocol: she has started methotrexate 10 mg weekly x 2 week then increase by 2.5 mg ever 2 wks until she reaches 20 mg weekly.  She will continue at this dose.  Started prednisone 30 mg daily and will decrease by 5 mg every month until she is down to 10 mg, then will try to stop prednisone at that point.  She is on Bactrim for PJP prophylaxis, omeprazole for gastric prophylaxis, and folate.  - For the next few months, she will need monthly LFTs with MTX.  - Follow glucose carefully while on prednisone, watch for weight gain.  - I will start her on Coreg 3.125 mg bid with significant delayed enhancement burden and risk for VT.  - She will  get a repeat cardiac PET at 6 months.   Loralie Champagne 03/20/2021

## 2021-03-20 NOTE — Progress Notes (Signed)
Remote ICD transmission.   

## 2021-03-20 NOTE — Patient Instructions (Addendum)
Start Carvedilol 3.125 mg Twice daily   Labs done today, your results will be available in MyChart, we will contact you for abnormal readings.  Lab work needed monthly, this can be done in [employer] have been referred to Dr Amil Amen at [facility], they will call you for an appointment   Non-Cardiac CT scanning, (CAT scanning), is a noninvasive, special x-ray that produces cross-sectional images of the body using x-rays and a computer. CT scans help physicians diagnose and treat medical conditions. For some CT exams, a contrast material is used to enhance visibility in the area of the body being studied. CT scans provide greater clarity and reveal more details than regular x-ray exams. ONCE APPROVED BY YOUR INSURANCE COMPANY WE WILL CALL YOU TO SCHEDULE THIS  Your physician recommends that you schedule a follow-up appointment in: 2 months  If you have any questions or concerns before your next appointment please send [name] a message through [place] or call our office at [phone].    TO LEAVE A MESSAGE FOR THE NURSE SELECT OPTION 2, PLEASE LEAVE A MESSAGE INCLUDING: YOUR NAME DATE OF BIRTH CALL BACK NUMBER REASON FOR CALL**this is important as we prioritize the call backs  YOU WILL RECEIVE A CALL BACK THE SAME DAY AS LONG AS YOU CALL BEFORE 4:00 PM  At the [facility], you and your health needs are our priority. As part of our continuing mission to provide you with exceptional heart care, we have created designated Provider Care Teams. These Care Teams include your primary Cardiologist (physician) and Advanced Practice Providers (APPs- Physician Assistants and Nurse Practitioners) who all work together to provide you with the care you need, when you need it.   You may see any of the following providers on your designated Care Team at your next follow up: Dr Glori Bickers Dr Loralie Champagne Dr Patrice Paradise, NP Lyda Jester,  [place] Ginnie Smart Audry Riles, PharmD   Please be sure to bring in all your medications bottles to every appointment.

## 2021-03-24 NOTE — Progress Notes (Signed)
Referral form, demographics, and records faxed to Dr Amil Amen at [facility]

## 2021-03-24 NOTE — Addendum Note (Signed)
Encounter addended by: Scarlette Calico, RN on: 03/24/2021 4:06 PM  Actions taken: Clinical Note Signed

## 2021-03-24 NOTE — Addendum Note (Signed)
Encounter addended by: Scarlette Calico, RN on: 03/24/2021 3:52 PM  Actions taken: Order list changed, Diagnosis association updated

## 2021-03-31 ENCOUNTER — Telehealth (HOSPITAL_COMMUNITY): Payer: Self-pay | Admitting: *Deleted

## 2021-03-31 NOTE — Telephone Encounter (Signed)
High res CT approved

## 2021-04-02 ENCOUNTER — Encounter (HOSPITAL_COMMUNITY): Payer: Self-pay

## 2021-04-02 ENCOUNTER — Other Ambulatory Visit (HOSPITAL_COMMUNITY): Payer: Self-pay

## 2021-04-02 MED ORDER — SULFAMETHOXAZOLE-TRIMETHOPRIM 800-160 MG PO TABS: ORAL_TABLET | ORAL | 3 refills | Status: DC

## 2021-04-02 MED ORDER — METHOTREXATE 2.5 MG PO TABS: ORAL_TABLET | ORAL | 0 refills | Status: DC

## 2021-04-08 ENCOUNTER — Other Ambulatory Visit: Payer: Self-pay

## 2021-04-08 ENCOUNTER — Ambulatory Visit (HOSPITAL_COMMUNITY)
Admission: RE | Admit: 2021-04-08 | Discharge: 2021-04-08 | Disposition: A | Discharge status: Home / Self Care | Payer: BC Managed Care – PPO | Source: Ambulatory Visit | Attending: Cardiology | Admitting: Cardiology

## 2021-04-08 ENCOUNTER — Encounter (HOSPITAL_COMMUNITY): Payer: Self-pay

## 2021-04-08 DIAGNOSIS — R918 Other nonspecific abnormal finding of lung field: Secondary | ICD-10-CM | POA: Diagnosis not present

## 2021-04-08 DIAGNOSIS — D869 Sarcoidosis, unspecified: Secondary | ICD-10-CM

## 2021-04-08 DIAGNOSIS — R911 Solitary pulmonary nodule: Secondary | ICD-10-CM | POA: Diagnosis not present

## 2021-04-10 ENCOUNTER — Other Ambulatory Visit: Payer: Self-pay

## 2021-04-11 ENCOUNTER — Encounter (HOSPITAL_COMMUNITY): Payer: Self-pay | Admitting: *Deleted

## 2021-04-11 ENCOUNTER — Other Ambulatory Visit: Payer: BC Managed Care – PPO

## 2021-04-11 ENCOUNTER — Other Ambulatory Visit
Admission: RE | Admit: 2021-04-11 | Discharge: 2021-04-11 | Disposition: A | Discharge status: Home / Self Care | Payer: BC Managed Care – PPO | Attending: Internal Medicine | Admitting: Internal Medicine

## 2021-04-11 ENCOUNTER — Other Ambulatory Visit (HOSPITAL_COMMUNITY): Payer: Self-pay

## 2021-04-11 ENCOUNTER — Other Ambulatory Visit: Payer: Self-pay

## 2021-04-11 ENCOUNTER — Telehealth: Payer: Self-pay | Admitting: Internal Medicine

## 2021-04-11 ENCOUNTER — Other Ambulatory Visit: Payer: Self-pay | Admitting: *Deleted

## 2021-04-11 DIAGNOSIS — D869 Sarcoidosis, unspecified: Secondary | ICD-10-CM | POA: Diagnosis not present

## 2021-04-11 DIAGNOSIS — Z79899 Other long term (current) drug therapy: Secondary | ICD-10-CM | POA: Insufficient documentation

## 2021-04-11 LAB — CBC WITH DIFFERENTIAL/PLATELET
Abs Immature Granulocytes: 0.03 10*3/uL (ref 0.00–0.07)
Basophils Absolute: 0 10*3/uL (ref 0.0–0.1)
Basophils Relative: 0 %
Eosinophils Absolute: 0.1 10*3/uL (ref 0.0–0.5)
Eosinophils Relative: 2 %
HCT: 42.5 % (ref 36.0–46.0)
Hemoglobin: 13.3 g/dL (ref 12.0–15.0)
Immature Granulocytes: 1 %
Lymphocytes Relative: 48 %
Lymphs Abs: 3.2 10*3/uL (ref 0.7–4.0)
MCH: 26 pg (ref 26.0–34.0)
MCHC: 31.3 g/dL (ref 30.0–36.0)
MCV: 83.2 fL (ref 80.0–100.0)
Monocytes Absolute: 0.4 10*3/uL (ref 0.1–1.0)
Monocytes Relative: 6 %
Neutro Abs: 2.7 10*3/uL (ref 1.7–7.7)
Neutrophils Relative %: 43 %
Platelets: 244 10*3/uL (ref 150–400)
RBC: 5.11 MIL/uL (ref 3.87–5.11)
RDW: 15.5 % (ref 11.5–15.5)
WBC: 6.4 10*3/uL (ref 4.0–10.5)
nRBC: 0 % (ref 0.0–0.2)

## 2021-04-11 LAB — COMPREHENSIVE METABOLIC PANEL
ALT: 21 U/L (ref 0–44)
AST: 21 U/L (ref 15–41)
Albumin: 3.9 g/dL (ref 3.5–5.0)
Alkaline Phosphatase: 34 U/L — ABNORMAL LOW (ref 38–126)
Anion gap: 9 (ref 5–15)
BUN: 15 mg/dL (ref 6–20)
CO2: 30 mmol/L (ref 22–32)
Calcium: 8.8 mg/dL — ABNORMAL LOW (ref 8.9–10.3)
Chloride: 101 mmol/L (ref 98–111)
Creatinine, Ser: 0.92 mg/dL (ref 0.44–1.00)
GFR, Estimated: >60 mL/min (ref >60)
Glucose, Bld: 98 mg/dL (ref 70–99)
Potassium: 3.7 mmol/L (ref 3.5–5.1)
Sodium: 140 mmol/L (ref 135–145)
Total Bilirubin: 1.1 mg/dL (ref 0.3–1.2)
Total Protein: 6.6 g/dL (ref 6.5–8.1)

## 2021-04-11 MED ORDER — METHOTREXATE 2.5 MG PO TABS: ORAL_TABLET | ORAL | 0 refills | Status: DC

## 2021-04-11 MED ORDER — PREDNISONE 10 MG PO TABS: 25.0000 mg | ORAL_TABLET | Freq: Every day | ORAL | 0 refills | Status: DC

## 2021-04-11 MED ORDER — PREDNISONE 10 MG PO TABS: 25.0000 mg | ORAL_TABLET | Freq: Every day | ORAL | 0 refills | Status: AC

## 2021-04-11 NOTE — Progress Notes (Signed)
C-

## 2021-04-11 NOTE — Telephone Encounter (Signed)
*  STAT* If patient is at the pharmacy, call can be transferred to refill team.   1. Which medications need to be refilled? (please list name of each medication and dose if known) predniSONE (DELTASONE) 10 MG tablet  2. Which pharmacy/location (including street and city if local pharmacy) is medication to be sent to? [place], [place]  3. Do they need a 30 day or 90 day supply? 90 day   Patient only has enough medication for tomorrow.

## 2021-04-11 NOTE — Telephone Encounter (Signed)
Rx sent in

## 2021-04-11 NOTE — Telephone Encounter (Signed)
Patient's prednisone is currently being managed by Dr. Aundra Dubin at the [facility] clinic.  Routing to [employer], Therapist, sports.

## 2021-04-11 NOTE — Telephone Encounter (Signed)
Please advise if patient is to continue taking prednisone. Thank you!

## 2021-05-11 ENCOUNTER — Other Ambulatory Visit: Payer: Self-pay

## 2021-05-12 ENCOUNTER — Other Ambulatory Visit (HOSPITAL_COMMUNITY): Payer: Self-pay | Admitting: *Deleted

## 2021-05-12 ENCOUNTER — Other Ambulatory Visit: Payer: Self-pay | Admitting: Cardiology

## 2021-05-12 DIAGNOSIS — D869 Sarcoidosis, unspecified: Secondary | ICD-10-CM

## 2021-05-12 NOTE — Telephone Encounter (Signed)
Refill request

## 2021-05-13 ENCOUNTER — Telehealth (HOSPITAL_COMMUNITY): Payer: Self-pay

## 2021-05-13 MED ORDER — PREDNISONE 20 MG PO TABS: 20.0000 mg | ORAL_TABLET | Freq: Every day | ORAL | 0 refills | Status: DC

## 2021-05-13 NOTE — Telephone Encounter (Signed)
Patient is calling requesting a prednisone refill.  She says she is on a 6 month treatment plan for this. Please advise and contact patient.

## 2021-05-13 NOTE — Telephone Encounter (Signed)
Rx sent in

## 2021-05-14 ENCOUNTER — Other Ambulatory Visit: Payer: Self-pay

## 2021-05-14 ENCOUNTER — Other Ambulatory Visit (INDEPENDENT_AMBULATORY_CARE_PROVIDER_SITE_OTHER): Payer: BC Managed Care – PPO

## 2021-05-14 DIAGNOSIS — D869 Sarcoidosis, unspecified: Secondary | ICD-10-CM

## 2021-05-15 LAB — COMPREHENSIVE METABOLIC PANEL
ALT: 20 IU/L (ref 0–32)
AST: 17 IU/L (ref 0–40)
Albumin/Globulin Ratio: 2 (ref 1.2–2.2)
Albumin: 4.1 g/dL (ref 3.8–4.9)
Alkaline Phosphatase: 43 IU/L — ABNORMAL LOW (ref 44–121)
BUN/Creatinine Ratio: 16 (ref 9–23)
BUN: 19 mg/dL (ref 6–24)
Bilirubin Total: 0.3 mg/dL (ref 0.0–1.2)
CO2: 26 mmol/L (ref 20–29)
Calcium: 9.4 mg/dL (ref 8.7–10.2)
Chloride: 103 mmol/L (ref 96–106)
Creatinine, Ser: 1.18 mg/dL — ABNORMAL HIGH (ref 0.57–1.00)
Globulin, Total: 2.1 g/dL (ref 1.5–4.5)
Glucose: 89 mg/dL (ref 70–99)
Potassium: 4.7 mmol/L (ref 3.5–5.2)
Sodium: 144 mmol/L (ref 134–144)
Total Protein: 6.2 g/dL (ref 6.0–8.5)
eGFR: 54 mL/min/{1.73_m2} — ABNORMAL LOW (ref >59)

## 2021-05-20 ENCOUNTER — Encounter (HOSPITAL_COMMUNITY): Payer: Self-pay | Admitting: Cardiology

## 2021-05-20 ENCOUNTER — Ambulatory Visit (HOSPITAL_COMMUNITY)
Admission: RE | Admit: 2021-05-20 | Discharge: 2021-05-20 | Disposition: A | Discharge status: Home / Self Care | Payer: BC Managed Care – PPO | Source: Ambulatory Visit | Attending: Cardiology | Admitting: Cardiology

## 2021-05-20 VITALS — BP 140/80 | HR 68 | Wt 189.0 lb

## 2021-05-20 DIAGNOSIS — I442 Atrioventricular block, complete: Secondary | ICD-10-CM

## 2021-05-20 DIAGNOSIS — I5022 Chronic systolic (congestive) heart failure: Secondary | ICD-10-CM

## 2021-05-20 DIAGNOSIS — R0602 Shortness of breath: Secondary | ICD-10-CM | POA: Diagnosis not present

## 2021-05-20 DIAGNOSIS — Z79899 Other long term (current) drug therapy: Secondary | ICD-10-CM | POA: Insufficient documentation

## 2021-05-20 DIAGNOSIS — D869 Sarcoidosis, unspecified: Secondary | ICD-10-CM | POA: Diagnosis not present

## 2021-05-20 DIAGNOSIS — Z7952 Long term (current) use of systemic steroids: Secondary | ICD-10-CM | POA: Diagnosis not present

## 2021-05-20 DIAGNOSIS — Z8679 Personal history of other diseases of the circulatory system: Secondary | ICD-10-CM | POA: Insufficient documentation

## 2021-05-20 MED ORDER — FUROSEMIDE 20 MG PO TABS: 20.0000 mg | ORAL_TABLET | Freq: Every day | ORAL | 3 refills | Status: DC

## 2021-05-20 MED ORDER — POTASSIUM CHLORIDE CRYS ER 10 MEQ PO TBCR: 10.0000 meq | EXTENDED_RELEASE_TABLET | Freq: Every day | ORAL | 3 refills | Status: DC

## 2021-05-20 MED ORDER — CARVEDILOL 3.125 MG PO TABS
3.1250 mg | ORAL_TABLET | Freq: Two times a day (BID) | ORAL | 3 refills | Status: DC
Start: 2021-05-20 — End: 2022-05-21

## 2021-05-20 NOTE — Patient Instructions (Addendum)
EKG done today.   No Labs done today.   START Lasix 20mg  (1 tablet) by mouth daily.   START Potassium 62meq (1 tablet) by mouth daily.  No other medication changes were made. Please continue all current medications as prescribed.  Your physician recommends that you schedule a follow-up appointment in: 10 days for a lab only appointment(in [place]), 6 weeks with our clinic pharmacist, 2 months for a lab only appointment(in [place]) and in 3 months with Dr. Aundra Dubin.   If you have any questions or concerns before your next appointment please send [name] a message through [place] or call our office at [phone].    TO LEAVE A MESSAGE FOR THE NURSE SELECT OPTION 2, PLEASE LEAVE A MESSAGE INCLUDING: YOUR NAME DATE OF BIRTH CALL BACK NUMBER REASON FOR CALL**this is important as we prioritize the call backs  YOU WILL RECEIVE A CALL BACK THE SAME DAY AS LONG AS YOU CALL BEFORE 4:00 PM   Do the following things EVERYDAY: Weigh yourself in the morning before breakfast. Write it down and keep it in a log. Take your medicines as prescribed Eat low salt foods--Limit salt (sodium) to 2000 mg per day.  Stay as active as you can everyday Limit all fluids for the day to less than 2 liters   At the [facility], you and your health needs are our priority. As part of our continuing mission to provide you with exceptional heart care, we have created designated Provider Care Teams. These Care Teams include your primary Cardiologist (physician) and Advanced Practice Providers (APPs- Physician Assistants and Nurse Practitioners) who all work together to provide you with the care you need, when you need it.   You may see any of the following providers on your designated Care Team at your next follow up: Dr Glori Bickers Dr Haynes Kerns, NP Lyda Jester, [place] Audry Riles, PharmD   Please be sure to bring in all your medications bottles to every appointment.

## 2021-05-21 NOTE — Progress Notes (Addendum)
PCP: [name], MD EP: Dr. Caryl Comes HF cardiology: Dr. Aundra Dubin  55 y.o. with history of complete heart block s/p St Jude BiV ICD placement and suspected cardiac sarcoidosis was referred by Dr. Caryl Comes for management of cardiac sarcoidosis. Patient generally has been in good health, no major medical problems until 5/22.  In 5/22, she developed "dizzy spells."  She would check her pulse and find it to be in the 30s.  She did not pass out.  She had an ECG at her PCP's office showing complete heart block and was admitted.  Echo showed normal LV systolic function.  Cardiac MRI was done due to concern for possible cardiac sarcoidosis as cause of CHB in a relatively young woman.  Cardiac MRI showed a mid-wall septal LGE pattern with elevated septal T2 signal that was concerning for cardiac sarcoidosis.  Patient had implantation of St Jude CRT-D device.  She then had a cardiac PET at [facility] which showed inflammation in the septum and the RV free wall along with a septal perfusion defect.  She was seen by Dr. Caryl Comes and was started on prednisone and methotrexate for treatment of high probability cardiac sarcoidosis.   CT chest in 9/22 did not show signs of sarcoidosis.   She currently seems to be doing well.  She is tolerating MTX and prednisone.  Weight is up 2 lbs.  She is short of breath walking up 2 flights of stairs or walking a long distance.  No chest pain.  No orthopnea/PND.  Feels "puffy."     Labs (5/22): K 4, creatinine 0.98 Labs (11/22): K 4.7, creatinine 1.18, LFTs normal  St Jude device interrogation: 1 run NSVT, 97% BiV pacing, no AF, decreased thoracic impedance.   PMH: 1. H/o Shingles 2. Ovarian cyst 3. Complete heart block: Thought to be due to cardiac sarcoidosis, s/p placement of St Jude CRT-D device.  4. Cardiac sarcoidosis: Strong suspicion.  Patient presented in 5/22 with complete heart block.  - Echo (5/22): EF 60-65%, normal RV, PASP 36 mmHg.  - Cardiac MRI (5/22): LV EF 64%,  increased T2 signal in septum, mid-wall septal LGE (non-MI pattern), RV EF 61%.  - Cardiac PET (7/22): LV EF 57%, inflammation in 57% of the septal wall and 17% of the total LV, inflammation also involved the RV free wall.  There was a septal perfusion defect. This was concerning for possible cardiac sarcoidosis.  - High resolution CT chest (9/22): No signs of sarcoidosis, small RUL nodule.   SH: Single, [place] from [place] originally, now lives in [place], nonsmoker, no ETOH/drugs, working full time.   FH: No family history of sarcoidosis or cardiomyopathy.   ROS: All systems reviewed and negative except as per HPI.   Current Outpatient Medications  Medication Sig Dispense Refill   Cholecalciferol (VITAMIN D-3) 25 MCG (1000 UT) CAPS Take 2 capsules (2,000 Units total) by mouth every morning. 2000 60 capsule    diphenhydrAMINE (BENADRYL) 25 mg capsule Take 25 mg by mouth every 4 (four) hours as needed for itching.     folic acid (FOLVITE) 1 MG tablet Take 1 tablet (1 mg total) by mouth daily. 30 tablet 6   furosemide (LASIX) 20 MG tablet Take 1 tablet (20 mg total) by mouth daily. 90 tablet 3   methotrexate (RHEUMATREX) 2.5 MG tablet Take 8 tablets (20 mg total) by mouth once a week. Caution:Chemotherapy. Protect from light. 32 tablet 6   omeprazole (PRILOSEC) 20 MG capsule Take 1 capsule (20 mg  total) by mouth daily. 30 capsule 6   potassium chloride (KLOR-CON) 10 MEQ tablet Take 1 tablet (10 mEq total) by mouth daily. 90 tablet 3   predniSONE (DELTASONE) 20 MG tablet Take 1 tablet (20 mg total) by mouth daily with breakfast. 30 tablet 0   sulfamethoxazole-trimethoprim (BACTRIM DS) 800-160 MG tablet 1 tablet (800-160) every Monday, Wednesday and Friday while taking 15 mg or more of prednisone daily 12 tablet 3   vitamin B-12 (CYANOCOBALAMIN) 500 MCG tablet Take 1 tablet (500 mcg total) by mouth every morning.     carvedilol (COREG) 3.125 MG tablet Take 1 tablet (3.125 mg total) by  mouth 2 (two) times daily. 180 tablet 3   No current facility-administered medications for this encounter.   BP 140/80   Pulse 68   Wt 85.7 kg (189 lb)   SpO2 99%   BMI 31.45 kg/m  General: NAD Neck: No JVD, no thyromegaly or thyroid nodule.  Lungs: Clear to auscultation bilaterally with normal respiratory effort. CV: Nondisplaced PMI.  Heart regular S1/S2, no S3/S4, no murmur.  No peripheral edema.  No carotid bruit.  Normal pedal pulses.  Abdomen: Soft, nontender, no hepatosplenomegaly, no distention.  Skin: Intact without lesions or rashes.  Neurologic: Alert and oriented x 3.  Psych: Normal affect. Extremities: No clubbing or cyanosis.  HEENT: Normal.   Assessment/Plan: 1. Complete heart block: In a young woman, there was concern for cardiac sarcoidosis as the cause.  Workup with cMRI and cPET was highly concerning for cardiac sarcoidosis.  She now has a Research officer, political party CRT-D device.   - Dr. Caryl Comes follows CRT-D device.  2. Cardiac sarcoidosis: Strong suspicion for cardiac sarcoidosis in a relatively young woman with complete heart block and both cMRI and cPET that are consistent with cardiac sarcoidosis.  She does not have evidence for extracardiac sarcoidosis; 25% of cardiac sarcoidosis, however, is isolated to the heart.  No skin lesions and recent exam by eye doctor was unremarkable per her report.  No pulmonary symptoms, CT chest in 9/22 did not show signs of sarcoidosis.   We do not have a tissue diagnosis, but endomyocardial biopsy has a relatively low sensitivity in cardiac sarcoidosis.  Given high probability CS, we have treated her based on the data that we have.  Weight up, volume overload by [place].  - Check ACE level.  - I have referred to rheumatology (Dr. Amil Amen) given strong suspicion for sarcoidosis.  - Treatment using [facility] protocol: she is now on MTX 20 mg weekly.  She will continue at this dose.  She is down to prednisone 20 mg daily and will decrease by 5 mg every month  until she is down to 10 mg, then will try to stop prednisone at that point.  She is on Bactrim for PJP prophylaxis, omeprazole for gastric prophylaxis, and folate.  Continue Bactrim until prednisone is < 15 mg daily.  - She will need monthly LFTs with MTX (check today).   - Follow glucose carefully while on prednisone, watch for weight gain.  - Continue Coreg 3.125 mg bid with significant delayed enhancement burden and risk for VT.  - She will get a repeat cardiac PET in 1/23 to assess for improvement. - Start Lasix 20 mg daily with KCl 10 daily.  BMET today and in 10 days.   Followup in 6 wks with HF pharmacist to make sure she is tapering appropriately on prednisone and see me in 3 months.   Loralie Champagne 05/21/2021

## 2021-05-21 NOTE — Addendum Note (Signed)
Encounter addended by: Larey Dresser, MD on: 05/21/2021 12:56 AM  Actions taken: Clinical Note Signed

## 2021-06-02 ENCOUNTER — Other Ambulatory Visit (HOSPITAL_COMMUNITY): Payer: BC Managed Care – PPO

## 2021-06-04 ENCOUNTER — Ambulatory Visit (INDEPENDENT_AMBULATORY_CARE_PROVIDER_SITE_OTHER): Payer: BC Managed Care – PPO

## 2021-06-04 DIAGNOSIS — I442 Atrioventricular block, complete: Secondary | ICD-10-CM | POA: Diagnosis not present

## 2021-06-08 LAB — CUP PACEART REMOTE DEVICE CHECK
Battery Remaining Longevity: 80 mo
Battery Remaining Percentage: 88 %
Battery Voltage: 3.07 V
Brady Statistic AP VP Percent: 16 %
Brady Statistic AP VS Percent: 1 %
Brady Statistic AS VP Percent: 81 %
Brady Statistic AS VS Percent: 2.5 %
Brady Statistic RA Percent Paced: 16 %
Date Time Interrogation Session: 20221124191012
HighPow Impedance: 68 Ohm
HighPow Impedance: 68 Ohm
Implantable Lead Implant Date: 20220520
Implantable Lead Implant Date: 20220520
Implantable Lead Implant Date: 20220520
Implantable Lead Location: 753858
Implantable Lead Location: 753859
Implantable Lead Location: 753860
Implantable Lead Model: 3830
Implantable Pulse Generator Implant Date: 20220520
Lead Channel Impedance Value: 450 Ohm
Lead Channel Impedance Value: 460 Ohm
Lead Channel Impedance Value: 630 Ohm
Lead Channel Pacing Threshold Amplitude: 0.5 V
Lead Channel Pacing Threshold Amplitude: 0.5 V
Lead Channel Pacing Threshold Amplitude: 2.25 V
Lead Channel Pacing Threshold Pulse Width: 0.05 ms
Lead Channel Pacing Threshold Pulse Width: 0.5 ms
Lead Channel Pacing Threshold Pulse Width: 0.5 ms
Lead Channel Sensing Intrinsic Amplitude: 4.1 mV
Lead Channel Sensing Intrinsic Amplitude: 6.2 mV
Lead Channel Setting Pacing Amplitude: 0.25 V
Lead Channel Setting Pacing Amplitude: 1.5 V
Lead Channel Setting Pacing Amplitude: 2.5 V
Lead Channel Setting Pacing Pulse Width: 0.05 ms
Lead Channel Setting Pacing Pulse Width: 0.5 ms
Lead Channel Setting Sensing Sensitivity: 0.5 mV
Pulse Gen Serial Number: 9956643

## 2021-06-09 ENCOUNTER — Other Ambulatory Visit (HOSPITAL_COMMUNITY): Payer: Self-pay | Admitting: *Deleted

## 2021-06-09 MED ORDER — METHOTREXATE 2.5 MG PO TABS: 20.0000 mg | ORAL_TABLET | ORAL | 6 refills | Status: DC

## 2021-06-09 MED ORDER — PREDNISONE 10 MG PO TABS
15.0000 mg | ORAL_TABLET | Freq: Every day | ORAL | 0 refills | Status: DC
Start: 2021-06-09 — End: 2021-07-01

## 2021-06-09 MED ORDER — METHOTREXATE 2.5 MG PO TABS: 20.0000 mg | ORAL_TABLET | ORAL | 3 refills | Status: DC

## 2021-06-16 NOTE — Progress Notes (Signed)
PCP: Sheryl Frizzle, MD EP: Dr. Caryl Comes HF Cardiology: Dr. Aundra Dubin    HPI:  55 y.o. with history of complete heart block s/p St Jude BiV ICD placement and suspected cardiac sarcoidosis was referred by Dr. Caryl Comes for management of cardiac sarcoidosis. Patient generally has been in good health, no major medical problems until 11/2020.  In 11/2020, she developed "dizzy spells."  She would check her pulse and find it to be in the 30s.  She did not pass out.  She had an ECG at her PCP's office showing complete heart block and was admitted.  Echo showed normal LV systolic function.  Cardiac MRI was done due to concern for possible cardiac sarcoidosis as cause of CHB in a relatively young woman.  Cardiac MRI showed a mid-wall septal LGE pattern with elevated septal T2 signal that was concerning for cardiac sarcoidosis.  Patient had implantation of St Jude CRT-D device.  She then had a cardiac PET at [facility] which showed inflammation in the septum and the RV free wall along with a septal perfusion defect.  She was seen by Dr. Caryl Comes and was started on prednisone and methotrexate for treatment of high probability cardiac sarcoidosis.    CT chest in 03/2021 did not show signs of sarcoidosis.    She recently presented to HF Clinic with Dr. Aundra Dubin on 05/20/21. She was doing well.  She had been tolerating MTX and prednisone.  Weight was up 2 lbs.  She was short of breath walking up 2 flights of stairs or walking a long distance.  No chest pain.  No orthopnea/PND.  Felt "puffy."     Today she returns to HF clinic for pharmacist medication titration. At last visit with MD, Lasix 20 mg daily and potassium chloride 10 mEq daily were initiated. She is currently taking prednisone 15 mg daily and methotrexate 20 mg weekly. Per protocol, she is scheduled to decrease prednisone to 10 mg daily tomorrow. Overall feeling well today. Weight has been stable. No LEE, PND or orthopnea.    Has the patient been experiencing any side  effects to the medications prescribed?  no  Does the patient have any problems obtaining medications due to transportation or finances?   No; BCBS [employer]  Understanding of regimen: good Understanding of indications: good Potential of compliance: good Patient understands to avoid NSAIDs. Patient understands to avoid decongestants.    Pertinent Lab Values: 06/26/21 (at rheumatology office): Serum creatinine 1.20, BUN 18, Potassium 4.2, Sodium 142, AST 20, ALT22 CBC/diff today pending    Assessment/Plan: 1. Complete heart block: In a young woman, there was concern for cardiac sarcoidosis as the cause.  Workup with cMRI and cPET was highly concerning for cardiac sarcoidosis.  She now has a Research officer, political party CRT-D device.   - Dr. Caryl Comes follows CRT-D device.  2. Cardiac sarcoidosis: Strong suspicion for cardiac sarcoidosis in a relatively young woman with complete heart block and both cMRI and cPET that are consistent with cardiac sarcoidosis.  She does not have evidence for extracardiac sarcoidosis; 25% of cardiac sarcoidosis, however, is isolated to the heart.  No skin lesions and recent exam by eye doctor was unremarkable per her report.  No pulmonary symptoms, CT chest in 03/2021 did not show signs of sarcoidosis.   We do not have a tissue diagnosis, but endomyocardial biopsy has a relatively low sensitivity in cardiac sarcoidosis.  Given high probability CS, we have treated her based on the data that we have.   - Referred  to rheumatology (Dr. Amil Amen) given strong suspicion for sarcoidosis. Established care 06/26/21.  - Treatment using [facility] protocol: she is now on MTX 20 mg weekly.  She will continue at this dose.  She is down to prednisone 15 mg daily. She will decrease to prednisone 10 mg daily tomorrow. Continue prednisone 10 mg for 2 weeks (end date 07/16/21). Per Dr. Claris Gladden last note, will then stop prednisone rather than continue slow down titration per protocol. Prednisone end  date 07/16/21.  -Stop Bactrim tomorrow as prednisone will be <15 mg daily.  - Stop omeprazole once prednisone is discontinued. She will continue calcium and vitamin D for bone health.  - Continue folic acid while on MTX - She will need monthly LFTs with MTX. LFTs normal on 06/26/21 at Rheumatology office.  - Follow glucose carefully while on prednisone, watch for weight gain.  - Continue carvedilol 3.125 mg BID with significant delayed enhancement burden and risk for VT.  - She will get a repeat cardiac PET in 07/2021 to assess for improvement. - Continue Lasix 20 mg daily with KCl 10 mEq daily.    Follow up 08/21/21 with Dr. Rush Farmer, PharmD, BCPS, [facility], [facility] Pharmacist [phone]

## 2021-06-16 NOTE — Progress Notes (Signed)
Remote ICD transmission.   

## 2021-06-23 ENCOUNTER — Telehealth: Payer: Self-pay | Admitting: Cardiology

## 2021-06-23 NOTE — Telephone Encounter (Signed)
Attempted to call the patient. No answer- I left a message to please call back.  

## 2021-06-23 NOTE — Telephone Encounter (Signed)
I spoke with the patient. She advised that on Thursday (12/8) she was at work and standing at [employer] talking with a co-worker, when she had an episode of dizziness.  She had another episode of dizziness at home on Friday night (12/9), where she had gotten up and walked to another room in her house. She checked her FitBit and her HR was 97.  I have confirmed with the patient that she did not check a BP during either of those episodes. She was just concerned as she has not had an episode like this since her ICD was placed.   The patient is currently on a treatment protocol for sarcoidosis (managed by Dr. Aundra Dubin).  I inquired if her episodes of dizziness were fleeting. She advised they were. I have asked her to please continue to monitor at this time and if she has another episode of dizziness, to please check her BP if possible.  The patient advised she has access to a BP monitor at work and home. She is agreeable to continue to monitor her readings and check her BP if this happens again.  She will call back with any further questions/ concerns-- she is aware we can work her in for an appointment if needed.  She voices understanding and was very appreciative of the call back.

## 2021-06-23 NOTE — Telephone Encounter (Signed)
STAT if patient feels like he/she is going to faint   Are you dizzy now? no  Do you feel faint or have you passed out? no  Do you have any other symptoms? Things was blurry,  Have you checked your HR and BP (record if available)? no  Patient states that she experience this on last Thursday and Friday. And she hasnt experience since she had the procedure.

## 2021-06-26 DIAGNOSIS — Z79899 Other long term (current) drug therapy: Secondary | ICD-10-CM | POA: Diagnosis not present

## 2021-06-26 DIAGNOSIS — D8685 Sarcoid myocarditis: Secondary | ICD-10-CM | POA: Diagnosis not present

## 2021-06-26 DIAGNOSIS — Z7952 Long term (current) use of systemic steroids: Secondary | ICD-10-CM | POA: Diagnosis not present

## 2021-06-26 NOTE — Telephone Encounter (Signed)
[name], MD [phone])  Sent: Wed June 25, 2021  6:44 PM  To: [name], RN          Message  And can we get her to transmit plz    To [facility]- do one of you mind reaching out to her to ask her to transmit per Dr. Caryl Comes?  Thank you!

## 2021-06-26 NOTE — Telephone Encounter (Signed)
Patient reports she is at work but will send when she gets home. Advised someone will follow up about transmission after reviewed. Agreeable to plan.

## 2021-07-01 ENCOUNTER — Other Ambulatory Visit: Payer: Self-pay

## 2021-07-01 ENCOUNTER — Ambulatory Visit (HOSPITAL_COMMUNITY)
Admission: RE | Admit: 2021-07-01 | Discharge: 2021-07-01 | Disposition: A | Discharge status: Home / Self Care | Payer: BC Managed Care – PPO | Source: Ambulatory Visit | Attending: Internal Medicine | Admitting: Internal Medicine

## 2021-07-01 ENCOUNTER — Telehealth (HOSPITAL_COMMUNITY): Payer: Self-pay | Admitting: *Deleted

## 2021-07-01 VITALS — Wt 188.2 lb

## 2021-07-01 DIAGNOSIS — Z79899 Other long term (current) drug therapy: Secondary | ICD-10-CM | POA: Diagnosis not present

## 2021-07-01 DIAGNOSIS — R0602 Shortness of breath: Secondary | ICD-10-CM | POA: Insufficient documentation

## 2021-07-01 DIAGNOSIS — Z7901 Long term (current) use of anticoagulants: Secondary | ICD-10-CM | POA: Insufficient documentation

## 2021-07-01 DIAGNOSIS — D869 Sarcoidosis, unspecified: Secondary | ICD-10-CM | POA: Insufficient documentation

## 2021-07-01 DIAGNOSIS — Z8679 Personal history of other diseases of the circulatory system: Secondary | ICD-10-CM | POA: Diagnosis not present

## 2021-07-01 DIAGNOSIS — I5022 Chronic systolic (congestive) heart failure: Secondary | ICD-10-CM | POA: Diagnosis not present

## 2021-07-01 LAB — CBC WITH DIFFERENTIAL/PLATELET
Abs Immature Granulocytes: 0.14 10*3/uL — ABNORMAL HIGH (ref 0.00–0.07)
Basophils Absolute: 0 10*3/uL (ref 0.0–0.1)
Basophils Relative: 1 %
Eosinophils Absolute: 0.1 10*3/uL (ref 0.0–0.5)
Eosinophils Relative: 1 %
HCT: 41.6 % (ref 36.0–46.0)
Hemoglobin: 13.2 g/dL (ref 12.0–15.0)
Immature Granulocytes: 2 %
Lymphocytes Relative: 45 %
Lymphs Abs: 3.4 10*3/uL (ref 0.7–4.0)
MCH: 28.1 pg (ref 26.0–34.0)
MCHC: 31.7 g/dL (ref 30.0–36.0)
MCV: 88.5 fL (ref 80.0–100.0)
Monocytes Absolute: 0.6 10*3/uL (ref 0.1–1.0)
Monocytes Relative: 9 %
Neutro Abs: 3.1 10*3/uL (ref 1.7–7.7)
Neutrophils Relative %: 42 %
Platelets: 300 10*3/uL (ref 150–400)
RBC: 4.7 MIL/uL (ref 3.87–5.11)
RDW: 17.2 % — ABNORMAL HIGH (ref 11.5–15.5)
WBC: 7.5 10*3/uL (ref 4.0–10.5)
nRBC: 0 % (ref 0.0–0.2)

## 2021-07-01 MED ORDER — PREDNISONE 10 MG PO TABS: 10.0000 mg | ORAL_TABLET | Freq: Every day | ORAL | 1 refills | Status: DC

## 2021-07-01 NOTE — Patient Instructions (Addendum)
It was a pleasure seeing you today!  MEDICATIONS: -Continue decrease of prednisone per protocol:   -Decrease prednisone to 10 mg (1 tablet) daily tomorrow (07/02/21). Discontinue prednisone on 07/16/21.   -Stop Bactrim (sulfamethoxazole/trimethoprim) tomorrow  -You may stop omeprazole when you stop the prednisone.  -Call if you have questions about your medications.  LABS: -We will call you if your labs need attention.  NEXT APPOINTMENT: Return to clinic 08/21/21 with Dr. Aundra Dubin.  In general, to take care of your heart failure: -Limit your fluid intake to 2 Liters (half-gallon) per day.   -Limit your salt intake to ideally 2-3 grams (2000-3000 mg) per day. -Weigh yourself daily and record, and bring that "weight diary" to your next appointment.  (Weight gain of 2-3 pounds in 1 day typically means fluid weight.) -The medications for your heart are to help your heart and help you live longer.   -Please contact us before stopping any of your heart medications.  Call the clinic at [phone] with questions or to reschedule future appointments.   -

## 2021-07-01 NOTE — Telephone Encounter (Signed)
° °  Pet scan auth approved

## 2021-07-01 NOTE — Telephone Encounter (Signed)
Remote transmission received and reviewed. Normal device function. No events noted.

## 2021-07-04 ENCOUNTER — Other Ambulatory Visit (HOSPITAL_COMMUNITY): Payer: Self-pay | Admitting: Cardiology

## 2021-07-15 NOTE — Telephone Encounter (Signed)
Patient called back and I have let her know that her transmission was normal

## 2021-07-15 NOTE — Telephone Encounter (Signed)
Attempted to contact patient to advise her transmission was normal per Dr. Caryl Comes. No answer, LMTCB.

## 2021-07-30 ENCOUNTER — Other Ambulatory Visit (HOSPITAL_COMMUNITY): Payer: BC Managed Care – PPO

## 2021-08-21 ENCOUNTER — Encounter (HOSPITAL_COMMUNITY): Payer: Self-pay | Admitting: Cardiology

## 2021-08-21 ENCOUNTER — Ambulatory Visit (HOSPITAL_COMMUNITY)
Admission: RE | Admit: 2021-08-21 | Discharge: 2021-08-21 | Disposition: A | Discharge status: Home / Self Care | Payer: BC Managed Care – PPO | Source: Ambulatory Visit | Attending: Cardiology | Admitting: Cardiology

## 2021-08-21 ENCOUNTER — Other Ambulatory Visit: Payer: Self-pay

## 2021-08-21 VITALS — BP 110/78 | HR 72 | Wt 190.6 lb

## 2021-08-21 DIAGNOSIS — Z95810 Presence of automatic (implantable) cardiac defibrillator: Secondary | ICD-10-CM | POA: Insufficient documentation

## 2021-08-21 DIAGNOSIS — Z79899 Other long term (current) drug therapy: Secondary | ICD-10-CM | POA: Diagnosis not present

## 2021-08-21 DIAGNOSIS — D8685 Sarcoid myocarditis: Secondary | ICD-10-CM | POA: Diagnosis not present

## 2021-08-21 DIAGNOSIS — D869 Sarcoidosis, unspecified: Secondary | ICD-10-CM

## 2021-08-21 LAB — COMPREHENSIVE METABOLIC PANEL
ALT: 19 U/L (ref 0–44)
AST: 27 U/L (ref 15–41)
Albumin: 4 g/dL (ref 3.5–5.0)
Alkaline Phosphatase: 48 U/L (ref 38–126)
Anion gap: 9 (ref 5–15)
BUN: 20 mg/dL (ref 6–20)
CO2: 26 mmol/L (ref 22–32)
Calcium: 9.1 mg/dL (ref 8.9–10.3)
Chloride: 104 mmol/L (ref 98–111)
Creatinine, Ser: 1.08 mg/dL — ABNORMAL HIGH (ref 0.44–1.00)
GFR, Estimated: 60 mL/min — ABNORMAL LOW (ref >60)
Glucose, Bld: 95 mg/dL (ref 70–99)
Potassium: 4 mmol/L (ref 3.5–5.1)
Sodium: 139 mmol/L (ref 135–145)
Total Bilirubin: 0.5 mg/dL (ref 0.3–1.2)
Total Protein: 6.4 g/dL — ABNORMAL LOW (ref 6.5–8.1)

## 2021-08-21 NOTE — Progress Notes (Signed)
PCP: [name], MD EP: Dr. Caryl Comes HF cardiology: Dr. Aundra Dubin  56 y.o. with history of complete heart block s/p St Jude BiV ICD placement and suspected cardiac sarcoidosis was referred by Dr. Caryl Comes for management of cardiac sarcoidosis. Patient generally has been in good health, no major medical problems until 5/22.  In 5/22, she developed "dizzy spells."  She would check her pulse and find it to be in the 30s.  She did not pass out.  She had an ECG at her PCP's office showing complete heart block and was admitted.  Echo showed normal LV systolic function.  Cardiac MRI was done due to concern for possible cardiac sarcoidosis as cause of CHB in a relatively young woman.  Cardiac MRI showed a mid-wall septal LGE pattern with elevated septal T2 signal that was concerning for cardiac sarcoidosis.  Patient had implantation of St Jude CRT-D device.  She then had a cardiac PET at [facility] which showed inflammation in the septum and the RV free wall along with a septal perfusion defect.  She was seen by Dr. Caryl Comes and was started on prednisone and methotrexate for treatment of high probability cardiac sarcoidosis.   CT chest in 9/22 did not show signs of pulmonary sarcoidosis.   She currently seems to be doing well.  She is now on 20 mg MTX and is off prednisone.  Weight is up 1 lb.  She denies exertional chest pain or dyspnea.  She walks the dog and plans on exercising more now that the weather is better.  No lightheadedness.   Labs (5/22): K 4, creatinine 0.98 Labs (11/22): K 4.7, creatinine 1.18, LFTs normal Labs (12/22): hgb 13.2, K 4.2, creatinine 1.2  St Jude device interrogation: 98% BiV pacing, recent drop in thoracic impedance but now back to baseline.   PMH: 1. H/o Shingles 2. Ovarian cyst 3. Complete heart block: Thought to be due to cardiac sarcoidosis, s/p placement of St Jude CRT-D device.  4. Cardiac sarcoidosis: Strong suspicion.  Patient presented in 5/22 with complete heart block.  -  Echo (5/22): EF 60-65%, normal RV, PASP 36 mmHg.  - Cardiac MRI (5/22): LV EF 64%, increased T2 signal in septum, mid-wall septal LGE (non-MI pattern), RV EF 61%.  - Cardiac PET (7/22): LV EF 57%, inflammation in 57% of the septal wall and 17% of the total LV, inflammation also involved the RV free wall.  There was a septal perfusion defect. This was concerning for possible cardiac sarcoidosis.  - High resolution CT chest (9/22): No signs of sarcoidosis, small RUL nodule.   SH: Single, [place] from [place] originally, now lives in [place], nonsmoker, no ETOH/drugs, working full time.   FH: No family history of sarcoidosis or cardiomyopathy.   ROS: All systems reviewed and negative except as per HPI.   Current Outpatient Medications  Medication Sig Dispense Refill   carvedilol (COREG) 3.125 MG tablet Take 1 tablet (3.125 mg total) by mouth 2 (two) times daily. 180 tablet 3   Cholecalciferol (VITAMIN D3) 25 MCG (1000 UT) CAPS Take 1 capsule by mouth daily.     diphenhydrAMINE (BENADRYL) 25 mg capsule Take 25 mg by mouth every 4 (four) hours as needed for itching.     folic acid (FOLVITE) 1 MG tablet Take 1 tablet (1 mg total) by mouth daily. 30 tablet 6   furosemide (LASIX) 20 MG tablet Take 1 tablet (20 mg total) by mouth daily. 90 tablet 3   methotrexate (RHEUMATREX) 2.5 MG tablet  Take 8 tablets (20 mg total) by mouth once a week. Caution:Chemotherapy. Protect from light. 96 tablet 3   potassium chloride (KLOR-CON) 10 MEQ tablet Take 1 tablet (10 mEq total) by mouth daily. 90 tablet 3   vitamin B-12 (CYANOCOBALAMIN) 500 MCG tablet Take 1 tablet (500 mcg total) by mouth every morning.     No current facility-administered medications for this encounter.   BP 110/78    Pulse 72    Wt 86.5 kg (190 lb 9.6 oz)    SpO2 100%    BMI 31.72 kg/m  General: NAD Neck: No JVD, no thyromegaly or thyroid nodule.  Lungs: Clear to auscultation bilaterally with normal respiratory effort. CV:  Nondisplaced PMI.  Heart regular S1/S2, no S3/S4, no murmur.  No peripheral edema.  No carotid bruit.  Normal pedal pulses.  Abdomen: Soft, nontender, no hepatosplenomegaly, no distention.  Skin: Intact without lesions or rashes.  Neurologic: Alert and oriented x 3.  Psych: Normal affect. Extremities: No clubbing or cyanosis.  HEENT: Normal.   Assessment/Plan: 1. Complete heart block: In a young woman, there was concern for cardiac sarcoidosis as the cause.  Workup with cMRI and cPET was highly concerning for cardiac sarcoidosis.  She now has a Research officer, political party CRT-D device.   - Dr. Caryl Comes follows CRT-D device.  2. Cardiac sarcoidosis: Strong suspicion for cardiac sarcoidosis in a relatively young woman with complete heart block and both cMRI and cPET that are consistent with cardiac sarcoidosis.  She does not have evidence for extracardiac sarcoidosis; 25% of cardiac sarcoidosis, however, is isolated to the heart.  No skin lesions and recent exam by eye doctor was unremarkable per her report.  No pulmonary symptoms, CT chest in 9/22 did not show signs of sarcoidosis.   We do not have a tissue diagnosis, but endomyocardial biopsy has a relatively low sensitivity in cardiac sarcoidosis.  Given high probability CS, we have treated her based on the data that we have. She is now off prednisone and on goal dose MTX.  - Check ACE level.  - She is seeing rheumatology (Dr Amil Amen).  - Treatment using [facility] protocol: she is now on MTX 20 mg weekly.  She will continue at this dose.  She is off prednisone.  - She will need q 3 month LFTs with MTX at this point (check today).   - Continue Coreg 3.125 mg bid with significant delayed enhancement burden and risk for VT.  - She will get a repeat cardiac PET at [facility] (ordered today) to assess for improvement. - Continue Lasix 20 mg daily with KCl 10 daily.  BMET today.   Followup in 3 months.   Loralie Champagne 08/21/2021

## 2021-08-21 NOTE — Patient Instructions (Signed)
Thank you for your visit today.  There has been no changes to your medications.  Labs done today, your results will be available in MyChart, we will contact you for abnormal readings.   Your provider wants you to have a follow up PET scan at [facility].ONCE APPROVED BY INSURANCE YOU WILL BE CALLED TO SCHEDULE THE TEST.  Your physician recommends that you schedule a follow-up appointment in: 3 months.  If you have any questions or concerns before your next appointment please send [name] a message through [place] or call our office at [phone].    TO LEAVE A MESSAGE FOR THE NURSE SELECT OPTION 2, PLEASE LEAVE A MESSAGE INCLUDING: YOUR NAME DATE OF BIRTH CALL BACK NUMBER REASON FOR CALL**this is important as we prioritize the call backs  YOU WILL RECEIVE A CALL BACK THE SAME DAY AS LONG AS YOU CALL BEFORE 4:00 PM  At the [facility], you and your health needs are our priority. As part of our continuing mission to provide you with exceptional heart care, we have created designated Provider Care Teams. These Care Teams include your primary Cardiologist (physician) and Advanced Practice Providers (APPs- Physician Assistants and Nurse Practitioners) who all work together to provide you with the care you need, when you need it.   You may see any of the following providers on your designated Care Team at your next follow up: Dr Glori Bickers Dr Haynes Kerns, NP Lyda Jester, [place] [facility] [place], [place] Audry Riles, PharmD   Please be sure to bring in all your medications bottles to every appointment.

## 2021-08-25 ENCOUNTER — Telehealth (HOSPITAL_COMMUNITY): Payer: Self-pay | Admitting: *Deleted

## 2021-08-25 NOTE — Telephone Encounter (Signed)
PET scan auth approved  Order ID: [id number]       Authorized  Approval Valid Through: 08/25/2021 - 09/23/2021

## 2021-09-03 ENCOUNTER — Ambulatory Visit (INDEPENDENT_AMBULATORY_CARE_PROVIDER_SITE_OTHER): Payer: BC Managed Care – PPO

## 2021-09-03 DIAGNOSIS — I442 Atrioventricular block, complete: Secondary | ICD-10-CM

## 2021-09-03 LAB — CUP PACEART REMOTE DEVICE CHECK
Battery Remaining Longevity: 77 mo
Battery Remaining Percentage: 85 %
Battery Voltage: 3.04 V
Brady Statistic AP VP Percent: 15 %
Brady Statistic AP VS Percent: 1 %
Brady Statistic AS VP Percent: 83 %
Brady Statistic AS VS Percent: 1.5 %
Brady Statistic RA Percent Paced: 15 %
Date Time Interrogation Session: 20230222020016
HighPow Impedance: 70 Ohm
HighPow Impedance: 70 Ohm
Implantable Lead Implant Date: 20220520
Implantable Lead Implant Date: 20220520
Implantable Lead Implant Date: 20220520
Implantable Lead Location: 753858
Implantable Lead Location: 753859
Implantable Lead Location: 753860
Implantable Lead Model: 3830
Implantable Pulse Generator Implant Date: 20220520
Lead Channel Impedance Value: 450 Ohm
Lead Channel Impedance Value: 450 Ohm
Lead Channel Impedance Value: 560 Ohm
Lead Channel Pacing Threshold Amplitude: 0.5 V
Lead Channel Pacing Threshold Amplitude: 0.625 V
Lead Channel Pacing Threshold Amplitude: 2.25 V
Lead Channel Pacing Threshold Pulse Width: 0.05 ms
Lead Channel Pacing Threshold Pulse Width: 0.5 ms
Lead Channel Pacing Threshold Pulse Width: 0.5 ms
Lead Channel Sensing Intrinsic Amplitude: 4.6 mV
Lead Channel Sensing Intrinsic Amplitude: 7.3 mV
Lead Channel Setting Pacing Amplitude: 0.25 V
Lead Channel Setting Pacing Amplitude: 1.625
Lead Channel Setting Pacing Amplitude: 2.5 V
Lead Channel Setting Pacing Pulse Width: 0.05 ms
Lead Channel Setting Pacing Pulse Width: 0.5 ms
Lead Channel Setting Sensing Sensitivity: 0.5 mV
Pulse Gen Serial Number: 9956643

## 2021-09-04 ENCOUNTER — Telehealth (HOSPITAL_COMMUNITY): Payer: Self-pay

## 2021-09-04 NOTE — Telephone Encounter (Signed)
Patient called stating that her [place] is going to expire before they can see her on 10/02/21. Can we extend this out for her?

## 2021-09-10 NOTE — Progress Notes (Signed)
Remote ICD transmission.   

## 2021-09-11 ENCOUNTER — Telehealth (HOSPITAL_COMMUNITY): Payer: Self-pay | Admitting: *Deleted

## 2021-09-11 NOTE — Telephone Encounter (Signed)
New auth ? ?

## 2021-09-26 DIAGNOSIS — Z6830 Body mass index (BMI) 30.0-30.9, adult: Secondary | ICD-10-CM | POA: Diagnosis not present

## 2021-09-26 DIAGNOSIS — Z1151 Encounter for screening for human papillomavirus (HPV): Secondary | ICD-10-CM | POA: Diagnosis not present

## 2021-09-26 DIAGNOSIS — Z124 Encounter for screening for malignant neoplasm of cervix: Secondary | ICD-10-CM | POA: Diagnosis not present

## 2021-09-26 DIAGNOSIS — I459 Conduction disorder, unspecified: Secondary | ICD-10-CM | POA: Insufficient documentation

## 2021-09-26 DIAGNOSIS — Z1231 Encounter for screening mammogram for malignant neoplasm of breast: Secondary | ICD-10-CM | POA: Diagnosis not present

## 2021-09-26 DIAGNOSIS — Z01419 Encounter for gynecological examination (general) (routine) without abnormal findings: Secondary | ICD-10-CM | POA: Diagnosis not present

## 2021-09-26 LAB — HM MAMMOGRAPHY

## 2021-09-29 ENCOUNTER — Other Ambulatory Visit: Payer: Self-pay | Admitting: Obstetrics and Gynecology

## 2021-09-29 DIAGNOSIS — R928 Other abnormal and inconclusive findings on diagnostic imaging of breast: Secondary | ICD-10-CM

## 2021-09-29 LAB — HM PAP SMEAR: HPV, high-risk: NEGATIVE

## 2021-10-02 DIAGNOSIS — R9439 Abnormal result of other cardiovascular function study: Secondary | ICD-10-CM | POA: Diagnosis not present

## 2021-10-02 DIAGNOSIS — Z950 Presence of cardiac pacemaker: Secondary | ICD-10-CM | POA: Diagnosis not present

## 2021-10-02 DIAGNOSIS — D8685 Sarcoid myocarditis: Secondary | ICD-10-CM | POA: Diagnosis not present

## 2021-10-06 ENCOUNTER — Telehealth (HOSPITAL_COMMUNITY): Payer: Self-pay | Admitting: *Deleted

## 2021-10-06 ENCOUNTER — Telehealth (HOSPITAL_COMMUNITY): Payer: Self-pay

## 2021-10-06 NOTE — Telephone Encounter (Signed)
It is unchanged, no worse or better.  Still with inflammation.  Need to continue MTX and repeat again in 6 months.  LV EF normal range.  ?

## 2021-10-06 NOTE — Telephone Encounter (Signed)
Results were very similar to the prior in terms of inflammation, no worse.  LV EF normal range.  Would continue methotrexate.  ?

## 2021-10-06 NOTE — Telephone Encounter (Signed)
Called patient and gave her the information. She has some questions that need to addressed by Dr. Aundra Dubin. She wants to know how does this years scan compare to last years, as does it look better or the same ?

## 2021-10-06 NOTE — Telephone Encounter (Signed)
Pt left vm requesting PET scan results. ?' ?Routed to [place]  ?

## 2021-10-06 NOTE — Telephone Encounter (Signed)
Left message for her to call the office back  ?

## 2021-10-07 ENCOUNTER — Encounter (HOSPITAL_COMMUNITY): Payer: Self-pay

## 2021-10-08 ENCOUNTER — Ambulatory Visit
Admission: RE | Admit: 2021-10-08 | Discharge: 2021-10-08 | Disposition: A | Discharge status: Home / Self Care | Payer: BC Managed Care – PPO | Source: Ambulatory Visit | Attending: Obstetrics and Gynecology | Admitting: Obstetrics and Gynecology

## 2021-10-08 DIAGNOSIS — R922 Inconclusive mammogram: Secondary | ICD-10-CM | POA: Diagnosis not present

## 2021-10-08 DIAGNOSIS — R928 Other abnormal and inconclusive findings on diagnostic imaging of breast: Secondary | ICD-10-CM

## 2021-10-08 DIAGNOSIS — R921 Mammographic calcification found on diagnostic imaging of breast: Secondary | ICD-10-CM | POA: Diagnosis not present

## 2021-10-13 DIAGNOSIS — Z7952 Long term (current) use of systemic steroids: Secondary | ICD-10-CM | POA: Diagnosis not present

## 2021-10-13 DIAGNOSIS — D8685 Sarcoid myocarditis: Secondary | ICD-10-CM | POA: Diagnosis not present

## 2021-10-13 DIAGNOSIS — Z79899 Other long term (current) drug therapy: Secondary | ICD-10-CM | POA: Diagnosis not present

## 2021-11-18 ENCOUNTER — Encounter (HOSPITAL_COMMUNITY): Payer: Self-pay | Admitting: Cardiology

## 2021-11-18 ENCOUNTER — Ambulatory Visit (HOSPITAL_COMMUNITY)
Admission: RE | Admit: 2021-11-18 | Discharge: 2021-11-18 | Disposition: A | Discharge status: Home / Self Care | Payer: BC Managed Care – PPO | Source: Ambulatory Visit | Attending: Cardiology | Admitting: Cardiology

## 2021-11-18 VITALS — BP 138/80 | HR 70

## 2021-11-18 DIAGNOSIS — D869 Sarcoidosis, unspecified: Secondary | ICD-10-CM | POA: Insufficient documentation

## 2021-11-18 DIAGNOSIS — R911 Solitary pulmonary nodule: Secondary | ICD-10-CM | POA: Insufficient documentation

## 2021-11-18 DIAGNOSIS — I442 Atrioventricular block, complete: Secondary | ICD-10-CM | POA: Insufficient documentation

## 2021-11-18 DIAGNOSIS — Z79899 Other long term (current) drug therapy: Secondary | ICD-10-CM | POA: Diagnosis not present

## 2021-11-18 LAB — CBC
HCT: 37.8 % (ref 36.0–46.0)
Hemoglobin: 12.3 g/dL (ref 12.0–15.0)
MCH: 27.3 pg (ref 26.0–34.0)
MCHC: 32.5 g/dL (ref 30.0–36.0)
MCV: 83.8 fL (ref 80.0–100.0)
Platelets: 245 10*3/uL (ref 150–400)
RBC: 4.51 MIL/uL (ref 3.87–5.11)
RDW: 14.2 % (ref 11.5–15.5)
WBC: 4.1 10*3/uL (ref 4.0–10.5)
nRBC: 0 % (ref 0.0–0.2)

## 2021-11-18 LAB — COMPREHENSIVE METABOLIC PANEL
ALT: 19 U/L (ref 0–44)
AST: 23 U/L (ref 15–41)
Albumin: 3.8 g/dL (ref 3.5–5.0)
Alkaline Phosphatase: 54 U/L (ref 38–126)
Anion gap: 6 (ref 5–15)
BUN: 11 mg/dL (ref 6–20)
CO2: 27 mmol/L (ref 22–32)
Calcium: 9.3 mg/dL (ref 8.9–10.3)
Chloride: 106 mmol/L (ref 98–111)
Creatinine, Ser: 1 mg/dL (ref 0.44–1.00)
GFR, Estimated: >60 mL/min (ref >60)
Glucose, Bld: 101 mg/dL — ABNORMAL HIGH (ref 70–99)
Potassium: 4.5 mmol/L (ref 3.5–5.1)
Sodium: 139 mmol/L (ref 135–145)
Total Bilirubin: 0.6 mg/dL (ref 0.3–1.2)
Total Protein: 6.4 g/dL — ABNORMAL LOW (ref 6.5–8.1)

## 2021-11-18 LAB — SALICYLATE LEVEL: Salicylate Lvl: <7 mg/dL — ABNORMAL LOW (ref 7.0–30.0)

## 2021-11-18 MED ORDER — METHOTREXATE 2.5 MG PO TABS: 20.0000 mg | ORAL_TABLET | ORAL | 3 refills | Status: DC

## 2021-11-18 NOTE — Progress Notes (Signed)
PCP: [name], MD ?EP: Dr. Caryl Comes ?HF cardiology: Dr. Aundra Dubin ? ?56 y.o. with history of complete heart block s/p St Jude BiV ICD placement and suspected cardiac sarcoidosis was referred by Dr. Caryl Comes for management of cardiac sarcoidosis. Patient generally has been in good health, no major medical problems until 5/22.  In 5/22, she developed "dizzy spells."  She would check her pulse and find it to be in the 30s.  She did not pass out.  She had an ECG at her PCP's office showing complete heart block and was admitted.  Echo showed normal LV systolic function.  Cardiac MRI was done due to concern for possible cardiac sarcoidosis as cause of CHB in a relatively young woman.  Cardiac MRI showed a mid-wall septal LGE pattern with elevated septal T2 signal that was concerning for cardiac sarcoidosis.  Patient had implantation of St Jude CRT-D device.  She then had a cardiac PET at [facility] which showed inflammation in the septum and the RV free wall along with a septal perfusion defect.  She was seen by Dr. Caryl Comes and was started on prednisone and methotrexate for treatment of high probability cardiac sarcoidosis.  ? ?CT chest in 9/22 did not show signs of pulmonary sarcoidosis.  ? ?Repeat cardiac PET in 3/23 showed EF 59%, several areas of hypermetabolic activity suggestive of active inflammatory process in the LV, similar to prior cPET.  ? ?She is now on 20 mg MTX and is off prednisone.  Weight down 3 lbs.  No chest pain.  No exertional dyspnea.  No lightheadedness or palpitations.  Overall, feels fine.  ? ?Labs (5/22): K 4, creatinine 0.98 ?Labs (11/22): K 4.7, creatinine 1.18, LFTs normal ?Labs (12/22): hgb 13.2, K 4.2, creatinine 1.2 ?Labs (2/23): LFTs normal, K 4, creatinine 1.08 ? ?St Jude device interrogation: 99% BiV pacing, 16% a-paced, no AF, stable thoracic impedance.  ? ?PMH: ?1. H/o Shingles ?2. Ovarian cyst ?3. Complete heart block: Thought to be due to cardiac sarcoidosis, s/p placement of St Jude CRT-D  device.  ?4. Cardiac sarcoidosis: Strong suspicion.  Patient presented in 5/22 with complete heart block.  ?- Echo (5/22): EF 60-65%, normal RV, PASP 36 mmHg.  ?- Cardiac MRI (5/22): LV EF 64%, increased T2 signal in septum, mid-wall septal LGE (non-MI pattern), RV EF 61%.  ?- Cardiac PET (7/22): LV EF 57%, inflammation in 57% of the septal wall and 17% of the total LV, inflammation also involved the RV free wall.  There was a septal perfusion defect. This was concerning for possible cardiac sarcoidosis.  ?- High resolution CT chest (9/22): No signs of sarcoidosis, small RUL nodule.  ?- Cardiac PET (3/23): EF 59%, several areas of hypermetabolic activity suggestive of active inflammatory process in the LV, similar to prior cPET.  ? ?SH: Single, [place] from [place] originally, now lives in [place], nonsmoker, no ETOH/drugs, working full time.  ? ?FH: No family history of sarcoidosis or cardiomyopathy.  ? ?ROS: All systems reviewed and negative except as per HPI.  ? ?Current Outpatient Medications  ?Medication Sig Dispense Refill  ? ASHWAGANDHA PO Take 3,000 mg by mouth daily.    ? BIOTIN MAXIMUM PO Take 10,000 mcg by mouth daily.    ? carvedilol (COREG) 3.125 MG tablet Take 1 tablet (3.125 mg total) by mouth 2 (two) times daily. 180 tablet 3  ? Cholecalciferol (VITAMIN D3) 25 MCG (1000 UT) CAPS Take 1 capsule by mouth daily.    ? diphenhydrAMINE (BENADRYL) 25 mg  capsule Take 25 mg by mouth every 4 (four) hours as needed for itching.    ? folic acid (FOLVITE) 1 MG tablet Take 1 tablet (1 mg total) by mouth daily. 30 tablet 6  ? furosemide (LASIX) 20 MG tablet Take 1 tablet (20 mg total) by mouth daily. 90 tablet 3  ? Magnesium 400 MG CAPS Take 1 capsule by mouth daily.    ? Multiple Vitamin (MULTIVITAMIN WITH MINERALS) TABS tablet Take 1 tablet by mouth daily.    ? Multiple Vitamins-Minerals (VITAMIN D3 COMPLETE PO) Take 1 tablet by mouth daily.    ? potassium chloride (KLOR-CON) 10 MEQ tablet Take 1 tablet  (10 mEq total) by mouth daily. 90 tablet 3  ? Probiotic Product (PROBIOTIC DAILY PO) Take 2 tablets by mouth daily in the afternoon.    ? vitamin B-12 (CYANOCOBALAMIN) 500 MCG tablet Take 1 tablet (500 mcg total) by mouth every morning.    ? vitamin E 180 MG (400 UNITS) capsule Take 400 Units by mouth daily.    ? methotrexate (RHEUMATREX) 2.5 MG tablet Take 8 tablets (20 mg total) by mouth once a week. Caution:Chemotherapy. Protect from light. 94 tablet 3  ? ?No current facility-administered medications for this encounter.  ? ?BP 138/80   Pulse 70   SpO2 100%  ?General: NAD ?Neck: No JVD, no thyromegaly or thyroid nodule.  ?Lungs: Clear to auscultation bilaterally with normal respiratory effort. ?CV: Nondisplaced PMI.  Heart regular S1/S2, no S3/S4, no murmur.  No peripheral edema.  No carotid bruit.  Normal pedal pulses.  ?Abdomen: Soft, nontender, no hepatosplenomegaly, no distention.  ?Skin: Intact without lesions or rashes.  ?Neurologic: Alert and oriented x 3.  ?Psych: Normal affect. ?Extremities: No clubbing or cyanosis.  ?HEENT: Normal.  ? ?Assessment/Plan: ?1. Complete heart block: In a young woman, there was concern for cardiac sarcoidosis as the cause.  Workup with cMRI and cPET was highly concerning for cardiac sarcoidosis.  She now has a Research officer, political party CRT-D device.   ?- Dr. Caryl Comes follows CRT-D device.  ?2. Cardiac sarcoidosis: Strong suspicion for cardiac sarcoidosis in a relatively young woman with complete heart block and both cMRI and cPET that are consistent with cardiac sarcoidosis.  She does not have evidence for extracardiac sarcoidosis; 25% of cardiac sarcoidosis, however, is isolated to the heart.  No skin lesions and exam by eye doctor was unremarkable per her report.  No pulmonary symptoms, CT chest in 9/22 did not show signs of sarcoidosis.   We do not have a tissue diagnosis, but endomyocardial biopsy has a relatively low sensitivity in cardiac sarcoidosis.  Given high probability CS, we have  treated her based on the data that we have. She is now off prednisone and on goal dose MTX.  Repeat cPET in 3/23 showed EF 59%, several areas of hypermetabolic activity suggestive of active inflammatory process in the LV, similar to prior cPET. She has been doing well symptomatically.  ?- Check ACE level.  ?- She is seeing rheumatology (Dr Amil Amen).  ?- Continue MTX 20 mg weekly.  She is tolerating this well.  ?- She will need q 3 month LFTs with MTX at this point (check today).   ?- Continue Coreg 3.125 mg bid with significant delayed enhancement burden and risk for VT.  ?- Repeat cPET at 1 year.  ?- Repeat echo at followup appt in 3 months.  ?- Continue Lasix 20 mg daily with KCl 10 daily.  BMET today.  ?3. Pulmonary nodule: Noted  on last CT chest.  ?- Repeat CT chest w/o contrast to followup on nodule.  ? ?Followup in 3 months with echo.  ? ?Loralie Champagne ?11/18/2021 ? ? ? ? ?

## 2021-11-18 NOTE — Patient Instructions (Signed)
There has been no changes to your medications. ? ?Labs done today, your results will be available in MyChart, we will contact you for abnormal readings. ? ?Your physician has requested that you have an echocardiogram. Echocardiography is a painless test that uses sound waves to create images of your heart. It provides your doctor with information about the size and shape of your heart and how well your heart?s chambers and valves are working. This procedure takes approximately one hour. There are no restrictions for this procedure. ? ?Non-Cardiac CT scanning, (CAT scanning), is a noninvasive, special x-ray that produces cross-sectional images of the body using x-rays and a computer. CT scans help physicians diagnose and treat medical conditions. For some CT exams, a contrast material is used to enhance visibility in the area of the body being studied. CT scans provide greater clarity and reveal more details than regular x-ray exams. ?Once approved by insurance you will be called to arrange the appointment. ? ?Your physician recommends that you schedule a follow-up appointment in: 3 months with echocardiogram. ? ?If you have any questions or concerns before your next appointment please send [name] a message through [place] or call our office at [phone].   ? ?TO LEAVE A MESSAGE FOR THE NURSE SELECT OPTION 2, PLEASE LEAVE A MESSAGE INCLUDING: ?YOUR NAME ?DATE OF BIRTH ?CALL BACK NUMBER ?REASON FOR CALL**this is important as we prioritize the call backs ? ?YOU WILL RECEIVE A CALL BACK THE SAME DAY AS LONG AS YOU CALL BEFORE 4:00 PM ? ?At the [facility], you and your health needs are our priority. As part of our continuing mission to provide you with exceptional heart care, we have created designated Provider Care Teams. These Care Teams include your primary Cardiologist (physician) and Advanced Practice Providers (APPs- Physician Assistants and Nurse Practitioners) who all work together to provide  you with the care you need, when you need it.  ? ?You may see any of the following providers on your designated Care Team at your next follow up: ?Dr Glori Bickers ?Dr Loralie Champagne ?Darrick Grinder, NP ?Lyda Jester, PA ?Jessica Milford,NP ?Marlyce Huge, PA ?Audry Riles, PharmD ? ? ?Please be sure to bring in all your medications bottles to every appointment.  ? ? ?

## 2021-11-24 ENCOUNTER — Telehealth (HOSPITAL_COMMUNITY): Payer: Self-pay | Admitting: Vascular Surgery

## 2021-11-24 ENCOUNTER — Telehealth (HOSPITAL_COMMUNITY): Payer: Self-pay | Admitting: *Deleted

## 2021-11-24 NOTE — Telephone Encounter (Signed)
Lvm giving pt CT appt , asked pt to call back to confirm date and time  ?

## 2021-12-01 ENCOUNTER — Ambulatory Visit (HOSPITAL_COMMUNITY)
Admission: RE | Admit: 2021-12-01 | Discharge: 2021-12-01 | Disposition: A | Discharge status: Home / Self Care | Payer: BC Managed Care – PPO | Source: Ambulatory Visit | Attending: Cardiology | Admitting: Cardiology

## 2021-12-01 DIAGNOSIS — R911 Solitary pulmonary nodule: Secondary | ICD-10-CM | POA: Diagnosis not present

## 2021-12-01 DIAGNOSIS — D869 Sarcoidosis, unspecified: Secondary | ICD-10-CM | POA: Diagnosis not present

## 2021-12-03 ENCOUNTER — Ambulatory Visit (INDEPENDENT_AMBULATORY_CARE_PROVIDER_SITE_OTHER): Payer: BC Managed Care – PPO

## 2021-12-03 DIAGNOSIS — I442 Atrioventricular block, complete: Secondary | ICD-10-CM

## 2021-12-03 LAB — CUP PACEART REMOTE DEVICE CHECK
Battery Remaining Longevity: 74 mo
Battery Remaining Percentage: 82 %
Battery Voltage: 3.02 V
Brady Statistic AP VP Percent: 16 %
Brady Statistic AP VS Percent: 1 %
Brady Statistic AS VP Percent: 83 %
Brady Statistic AS VS Percent: 1 %
Brady Statistic RA Percent Paced: 16 %
Date Time Interrogation Session: 20230524020014
HighPow Impedance: 68 Ohm
HighPow Impedance: 68 Ohm
Implantable Lead Implant Date: 20220520
Implantable Lead Implant Date: 20220520
Implantable Lead Implant Date: 20220520
Implantable Lead Location: 753858
Implantable Lead Location: 753859
Implantable Lead Location: 753860
Implantable Lead Model: 3830
Implantable Pulse Generator Implant Date: 20220520
Lead Channel Impedance Value: 450 Ohm
Lead Channel Impedance Value: 460 Ohm
Lead Channel Impedance Value: 550 Ohm
Lead Channel Pacing Threshold Amplitude: 0.5 V
Lead Channel Pacing Threshold Amplitude: 0.5 V
Lead Channel Pacing Threshold Amplitude: 2.25 V
Lead Channel Pacing Threshold Pulse Width: 0.05 ms
Lead Channel Pacing Threshold Pulse Width: 0.5 ms
Lead Channel Pacing Threshold Pulse Width: 0.5 ms
Lead Channel Sensing Intrinsic Amplitude: 3.7 mV
Lead Channel Sensing Intrinsic Amplitude: 7.3 mV
Lead Channel Setting Pacing Amplitude: 0.25 V
Lead Channel Setting Pacing Amplitude: 1.5 V
Lead Channel Setting Pacing Amplitude: 2.5 V
Lead Channel Setting Pacing Pulse Width: 0.05 ms
Lead Channel Setting Pacing Pulse Width: 0.5 ms
Lead Channel Setting Sensing Sensitivity: 0.5 mV
Pulse Gen Serial Number: 9956643

## 2021-12-16 NOTE — Progress Notes (Signed)
Remote ICD transmission.   

## 2022-01-15 ENCOUNTER — Other Ambulatory Visit
Admission: RE | Admit: 2022-01-15 | Discharge: 2022-01-15 | Disposition: A | Discharge status: Home / Self Care | Payer: BC Managed Care – PPO | Attending: Internal Medicine | Admitting: Internal Medicine

## 2022-01-15 DIAGNOSIS — D8685 Sarcoid myocarditis: Secondary | ICD-10-CM | POA: Insufficient documentation

## 2022-01-15 LAB — COMPREHENSIVE METABOLIC PANEL
ALT: 21 U/L (ref 0–44)
AST: 30 U/L (ref 15–41)
Albumin: 4.4 g/dL (ref 3.5–5.0)
Alkaline Phosphatase: 50 U/L (ref 38–126)
Anion gap: 8 (ref 5–15)
BUN: 15 mg/dL (ref 6–20)
CO2: 28 mmol/L (ref 22–32)
Calcium: 9.4 mg/dL (ref 8.9–10.3)
Chloride: 104 mmol/L (ref 98–111)
Creatinine, Ser: 0.93 mg/dL (ref 0.44–1.00)
GFR, Estimated: >60 mL/min (ref >60)
Glucose, Bld: 97 mg/dL (ref 70–99)
Potassium: 3.9 mmol/L (ref 3.5–5.1)
Sodium: 140 mmol/L (ref 135–145)
Total Bilirubin: 0.6 mg/dL (ref 0.3–1.2)
Total Protein: 7.3 g/dL (ref 6.5–8.1)

## 2022-01-15 LAB — CBC WITH DIFFERENTIAL/PLATELET
Abs Immature Granulocytes: 0.01 10*3/uL (ref 0.00–0.07)
Basophils Absolute: 0 10*3/uL (ref 0.0–0.1)
Basophils Relative: 1 %
Eosinophils Absolute: 0.1 10*3/uL (ref 0.0–0.5)
Eosinophils Relative: 3 %
HCT: 38.7 % (ref 36.0–46.0)
Hemoglobin: 12.3 g/dL (ref 12.0–15.0)
Immature Granulocytes: 0 %
Lymphocytes Relative: 42 %
Lymphs Abs: 1.5 10*3/uL (ref 0.7–4.0)
MCH: 26.5 pg (ref 26.0–34.0)
MCHC: 31.8 g/dL (ref 30.0–36.0)
MCV: 83.2 fL (ref 80.0–100.0)
Monocytes Absolute: 0.3 10*3/uL (ref 0.1–1.0)
Monocytes Relative: 9 %
Neutro Abs: 1.5 10*3/uL — ABNORMAL LOW (ref 1.7–7.7)
Neutrophils Relative %: 45 %
Platelets: 238 10*3/uL (ref 150–400)
RBC: 4.65 MIL/uL (ref 3.87–5.11)
RDW: 14.6 % (ref 11.5–15.5)
WBC: 3.5 10*3/uL — ABNORMAL LOW (ref 4.0–10.5)
nRBC: 0 % (ref 0.0–0.2)

## 2022-02-17 ENCOUNTER — Encounter: Payer: BC Managed Care – PPO | Admitting: Internal Medicine

## 2022-02-24 ENCOUNTER — Encounter (HOSPITAL_COMMUNITY): Payer: Self-pay | Admitting: Cardiology

## 2022-02-24 ENCOUNTER — Ambulatory Visit (HOSPITAL_COMMUNITY)
Admission: RE | Admit: 2022-02-24 | Discharge: 2022-02-24 | Disposition: A | Discharge status: Home / Self Care | Payer: BC Managed Care – PPO | Source: Ambulatory Visit | Attending: Cardiology | Admitting: Cardiology

## 2022-02-24 ENCOUNTER — Ambulatory Visit (HOSPITAL_BASED_OUTPATIENT_CLINIC_OR_DEPARTMENT_OTHER)
Admission: RE | Admit: 2022-02-24 | Discharge: 2022-02-24 | Disposition: A | Discharge status: Home / Self Care | Payer: BC Managed Care – PPO | Source: Ambulatory Visit | Attending: Cardiology | Admitting: Cardiology

## 2022-02-24 VITALS — BP 122/78 | HR 63 | Wt 183.6 lb

## 2022-02-24 DIAGNOSIS — I442 Atrioventricular block, complete: Secondary | ICD-10-CM | POA: Diagnosis not present

## 2022-02-24 DIAGNOSIS — R911 Solitary pulmonary nodule: Secondary | ICD-10-CM | POA: Diagnosis not present

## 2022-02-24 DIAGNOSIS — Z79899 Other long term (current) drug therapy: Secondary | ICD-10-CM | POA: Insufficient documentation

## 2022-02-24 DIAGNOSIS — Z95811 Presence of heart assist device: Secondary | ICD-10-CM | POA: Insufficient documentation

## 2022-02-24 DIAGNOSIS — I5022 Chronic systolic (congestive) heart failure: Secondary | ICD-10-CM | POA: Insufficient documentation

## 2022-02-24 DIAGNOSIS — D869 Sarcoidosis, unspecified: Secondary | ICD-10-CM | POA: Diagnosis not present

## 2022-02-24 LAB — CBC
HCT: 40.2 % (ref 36.0–46.0)
Hemoglobin: 12.5 g/dL (ref 12.0–15.0)
MCH: 26.8 pg (ref 26.0–34.0)
MCHC: 31.1 g/dL (ref 30.0–36.0)
MCV: 86.3 fL (ref 80.0–100.0)
Platelets: 258 10*3/uL (ref 150–400)
RBC: 4.66 MIL/uL (ref 3.87–5.11)
RDW: 14.3 % (ref 11.5–15.5)
WBC: 3.1 10*3/uL — ABNORMAL LOW (ref 4.0–10.5)
nRBC: 0 % (ref 0.0–0.2)

## 2022-02-24 LAB — COMPREHENSIVE METABOLIC PANEL
ALT: 15 U/L (ref 0–44)
AST: 21 U/L (ref 15–41)
Albumin: 4 g/dL (ref 3.5–5.0)
Alkaline Phosphatase: 47 U/L (ref 38–126)
Anion gap: 9 (ref 5–15)
BUN: 11 mg/dL (ref 6–20)
CO2: 26 mmol/L (ref 22–32)
Calcium: 9.3 mg/dL (ref 8.9–10.3)
Chloride: 105 mmol/L (ref 98–111)
Creatinine, Ser: 0.92 mg/dL (ref 0.44–1.00)
GFR, Estimated: >60 mL/min (ref >60)
Glucose, Bld: 95 mg/dL (ref 70–99)
Potassium: 4.1 mmol/L (ref 3.5–5.1)
Sodium: 140 mmol/L (ref 135–145)
Total Bilirubin: 0.4 mg/dL (ref 0.3–1.2)
Total Protein: 6.6 g/dL (ref 6.5–8.1)

## 2022-02-24 LAB — ECHOCARDIOGRAM COMPLETE
AR max vel: 2.86 cm2
AV Area VTI: 2.63 cm2
AV Area mean vel: 2.8 cm2
AV Mean grad: 3 mmHg
AV Peak grad: 6.2 mmHg
Ao pk vel: 1.24 m/s
Area-P 1/2: 4.31 cm2
S' Lateral: 3.8 cm

## 2022-02-24 NOTE — Progress Notes (Signed)
PCP: [name], MD EP: Dr. Caryl Comes HF cardiology: Dr. Aundra Dubin  56 y.o. with history of complete heart block s/p St Jude BiV ICD placement and suspected cardiac sarcoidosis was referred by Dr. Caryl Comes for management of cardiac sarcoidosis. Patient generally has been in good health, no major medical problems until 5/22.  In 5/22, she developed "dizzy spells."  She would check her pulse and find it to be in the 30s.  She did not pass out.  She had an ECG at her PCP's office showing complete heart block and was admitted.  Echo showed normal LV systolic function.  Cardiac MRI was done due to concern for possible cardiac sarcoidosis as cause of CHB in a relatively young woman.  Cardiac MRI showed a mid-wall septal LGE pattern with elevated septal T2 signal that was concerning for cardiac sarcoidosis.  Patient had implantation of St Jude CRT-D device.  She then had a cardiac PET at [facility] which showed inflammation in the septum and the RV free wall along with a septal perfusion defect.  She was seen by Dr. Caryl Comes and was started on prednisone and methotrexate for treatment of high probability cardiac sarcoidosis.   CT chest in 9/22 did not show signs of pulmonary sarcoidosis.   Repeat cardiac PET in 3/23 showed EF 59%, several areas of hypermetabolic activity suggestive of active inflammatory process in the LV, similar to prior cPET.   Echo was done today and reviewed, EF 55%, basal septal hypokinesis, mildly decreased RV systolic function.  Patient returns for followup of cardiac sarcoidosis.  She is now on 20 mg MTX and is off prednisone.  She has been feeling good overall.  No palpitations. No significant exertional dyspnea or chest pain.    ECG (personally reviewed): A-BiV pacing  Labs (5/22): K 4, creatinine 0.98 Labs (11/22): K 4.7, creatinine 1.18, LFTs normal Labs (12/22): hgb 13.2, K 4.2, creatinine 1.2 Labs (2/23): LFTs normal, K 4, creatinine 1.08 Labs (7/23): K 3.9, creatinine 0.93, LFTs  normal, hgb 12.3  St Jude device interrogation: >99% BiV pacing, stable thoracic impedance.   PMH: 1. H/o Shingles 2. Ovarian cyst 3. Complete heart block: Thought to be due to cardiac sarcoidosis, s/p placement of St Jude CRT-D device.  4. Cardiac sarcoidosis: Strong suspicion.  Patient presented in 5/22 with complete heart block.  - Echo (5/22): EF 60-65%, normal RV, PASP 36 mmHg.  - Cardiac MRI (5/22): LV EF 64%, increased T2 signal in septum, mid-wall septal LGE (non-MI pattern), RV EF 61%.  - Cardiac PET (7/22): LV EF 57%, inflammation in 57% of the septal wall and 17% of the total LV, inflammation also involved the RV free wall.  There was a septal perfusion defect. This was concerning for possible cardiac sarcoidosis.  - High resolution CT chest (9/22): No signs of sarcoidosis, small RUL nodule.  - Cardiac PET (3/23): EF 59%, several areas of hypermetabolic activity suggestive of active inflammatory process in the LV, similar to prior cPET.  - Echo (8/23): EF 55%, basal septal hypokinesis, mildly decreased RV systolic function. 5. Lung nodule: following with serial CTs  SH: Single, [place] from [place] originally, now lives in [place], nonsmoker, no ETOH/drugs, working full time.   FH: No family history of sarcoidosis or cardiomyopathy.   ROS: All systems reviewed and negative except as per HPI.   Current Outpatient Medications  Medication Sig Dispense Refill   BIOTIN MAXIMUM PO Take 10,000 mcg by mouth daily.     carvedilol (COREG) 3.125  MG tablet Take 1 tablet (3.125 mg total) by mouth 2 (two) times daily. 180 tablet 3   Cholecalciferol (VITAMIN D3) 25 MCG (1000 UT) CAPS Take 1 capsule by mouth daily.     diphenhydrAMINE (BENADRYL) 25 mg capsule Take 25 mg by mouth every 4 (four) hours as needed for itching.     folic acid (FOLVITE) 1 MG tablet Take 1 tablet (1 mg total) by mouth daily. 30 tablet 6   furosemide (LASIX) 20 MG tablet Take 1 tablet (20 mg total) by mouth  daily. 90 tablet 3   methotrexate (RHEUMATREX) 2.5 MG tablet Take 8 tablets (20 mg total) by mouth once a week. Caution:Chemotherapy. Protect from light. 94 tablet 3   Multiple Vitamin (MULTIVITAMIN WITH MINERALS) TABS tablet Take 1 tablet by mouth daily.     Multiple Vitamins-Minerals (VITAMIN D3 COMPLETE PO) Take 1 tablet by mouth daily.     potassium chloride (KLOR-CON) 10 MEQ tablet Take 1 tablet (10 mEq total) by mouth daily. 90 tablet 3   vitamin B-12 (CYANOCOBALAMIN) 500 MCG tablet Take 1 tablet (500 mcg total) by mouth every morning.     vitamin E 180 MG (400 UNITS) capsule Take 400 Units by mouth daily.     No current facility-administered medications for this encounter.   BP 122/78   Pulse 63   Wt 83.3 kg (183 lb 9.6 oz)   SpO2 100%   BMI 30.55 kg/m  General: NAD Neck: No JVD, no thyromegaly or thyroid nodule.  Lungs: Clear to auscultation bilaterally with normal respiratory effort. CV: Nondisplaced PMI.  Heart regular S1/S2, no S3/S4, no murmur.  No peripheral edema.  No carotid bruit.  Normal pedal pulses.  Abdomen: Soft, nontender, no hepatosplenomegaly, no distention.  Skin: Intact without lesions or rashes.  Neurologic: Alert and oriented x 3.  Psych: Normal affect. Extremities: No clubbing or cyanosis.  HEENT: Normal.   Assessment/Plan: 1. Complete heart block: In a young woman, there was concern for cardiac sarcoidosis as the cause.  Workup with cMRI and cPET was highly concerning for cardiac sarcoidosis.  She now has a Research officer, political party CRT-D device.   - Dr. Caryl Comes follows CRT-D device.  2. Cardiac sarcoidosis: Strong suspicion for cardiac sarcoidosis in a relatively young woman with complete heart block and both cMRI and cPET that are consistent with cardiac sarcoidosis.  She does not have evidence for extracardiac sarcoidosis; 25% of cardiac sarcoidosis, however, is isolated to the heart.  No skin lesions and exam by eye doctor was unremarkable per her report.  No pulmonary  symptoms, CT chest in 9/22 did not show signs of sarcoidosis.   We do not have a tissue diagnosis, but endomyocardial biopsy has a relatively low sensitivity in cardiac sarcoidosis.  Given high probability CS, we have treated her based on the data that we have. She is now off prednisone and on goal dose MTX.  Repeat cPET in 3/23 showed EF 59%, several areas of hypermetabolic activity suggestive of active inflammatory process in the LV, similar to prior cPET. Echo today with EF 55%, basal septal hypokinesis (consistent with septal site for LGE on cMRI).  Minimal symptoms.  - Check ACE level.  - She is seeing rheumatology (Dr Amil Amen).  - Continue MTX 20 mg weekly.  She is tolerating this well.  - She will need q 3 month LFTs with MTX at this point (check today).   - Continue Coreg 3.125 mg bid with significant delayed enhancement burden and risk for VT.  -  Repeat cPET at 1 year in 3/24.   - Continue Lasix 20 mg daily with KCl 10 daily.  BMET today.  3. Pulmonary nodule: Stable nodule on 5/23 CT.  - Repeat CT chest w/o contrast in 2 yrs (5/25).   Followup in 4-5 months.   Loralie Champagne 02/24/2022

## 2022-02-24 NOTE — Patient Instructions (Signed)
There has been no changes to your medications.  Labs done today, your results will be available in MyChart, we will contact you for abnormal readings.  Your physician recommends that you schedule a follow-up appointment in: 5 months ( January 2024)  ** please call the office in November to arrange your follow up appointment **  If you have any questions or concerns before your next appointment please send [name] a message through [place] or call our office at [phone].    TO LEAVE A MESSAGE FOR THE NURSE SELECT OPTION 2, PLEASE LEAVE A MESSAGE INCLUDING: YOUR NAME DATE OF BIRTH CALL BACK NUMBER REASON FOR CALL**this is important as we prioritize the call backs  YOU WILL RECEIVE A CALL BACK THE SAME DAY AS LONG AS YOU CALL BEFORE 4:00 PM  At the [facility], you and your health needs are our priority. As part of our continuing mission to provide you with exceptional heart care, we have created designated Provider Care Teams. These Care Teams include your primary Cardiologist (physician) and Advanced Practice Providers (APPs- Physician Assistants and Nurse Practitioners) who all work together to provide you with the care you need, when you need it.   You may see any of the following providers on your designated Care Team at your next follow up: Dr Glori Bickers Dr Haynes Kerns, NP Lyda Jester, [place] [facility], [place] Audry Riles, PharmD   Please be sure to bring in all your medications bottles to every appointment.

## 2022-02-24 NOTE — Progress Notes (Signed)
  Echocardiogram 2D Echocardiogram has been performed.  Sheryl Gonzalez 02/24/2022, 11:57 AM

## 2022-02-25 LAB — ANGIOTENSIN CONVERTING ENZYME: Angiotensin-Converting Enzyme: 82 U/L (ref 14–82)

## 2022-03-04 ENCOUNTER — Ambulatory Visit (INDEPENDENT_AMBULATORY_CARE_PROVIDER_SITE_OTHER): Payer: BC Managed Care – PPO

## 2022-03-04 DIAGNOSIS — I442 Atrioventricular block, complete: Secondary | ICD-10-CM

## 2022-03-05 LAB — CUP PACEART REMOTE DEVICE CHECK
Battery Remaining Longevity: 72 mo
Battery Remaining Percentage: 79 %
Battery Voltage: 3.01 V
Brady Statistic AP VP Percent: 18 %
Brady Statistic AP VS Percent: 1 %
Brady Statistic AS VP Percent: 81 %
Brady Statistic AS VS Percent: 1 %
Brady Statistic RA Percent Paced: 18 %
Date Time Interrogation Session: 20230823020015
HighPow Impedance: 71 Ohm
HighPow Impedance: 71 Ohm
Implantable Lead Implant Date: 20220520
Implantable Lead Implant Date: 20220520
Implantable Lead Implant Date: 20220520
Implantable Lead Location: 753858
Implantable Lead Location: 753859
Implantable Lead Location: 753860
Implantable Lead Model: 3830
Implantable Pulse Generator Implant Date: 20220520
Lead Channel Impedance Value: 430 Ohm
Lead Channel Impedance Value: 450 Ohm
Lead Channel Impedance Value: 580 Ohm
Lead Channel Pacing Threshold Amplitude: 0.5 V
Lead Channel Pacing Threshold Amplitude: 0.5 V
Lead Channel Pacing Threshold Amplitude: 2.25 V
Lead Channel Pacing Threshold Pulse Width: 0.05 ms
Lead Channel Pacing Threshold Pulse Width: 0.5 ms
Lead Channel Pacing Threshold Pulse Width: 0.5 ms
Lead Channel Sensing Intrinsic Amplitude: 3.9 mV
Lead Channel Sensing Intrinsic Amplitude: 7.3 mV
Lead Channel Setting Pacing Amplitude: 0.25 V
Lead Channel Setting Pacing Amplitude: 1.5 V
Lead Channel Setting Pacing Amplitude: 2.5 V
Lead Channel Setting Pacing Pulse Width: 0.05 ms
Lead Channel Setting Pacing Pulse Width: 0.5 ms
Lead Channel Setting Sensing Sensitivity: 0.5 mV
Pulse Gen Serial Number: 9956643

## 2022-03-10 ENCOUNTER — Encounter: Payer: Self-pay | Admitting: Cardiology

## 2022-03-31 NOTE — Progress Notes (Signed)
Remote ICD transmission.   

## 2022-04-13 DIAGNOSIS — Z79899 Other long term (current) drug therapy: Secondary | ICD-10-CM | POA: Diagnosis not present

## 2022-04-13 DIAGNOSIS — D8685 Sarcoid myocarditis: Secondary | ICD-10-CM | POA: Diagnosis not present

## 2022-04-13 DIAGNOSIS — Z7952 Long term (current) use of systemic steroids: Secondary | ICD-10-CM | POA: Diagnosis not present

## 2022-04-21 ENCOUNTER — Ambulatory Visit: Payer: BC Managed Care – PPO | Attending: Internal Medicine | Admitting: Internal Medicine

## 2022-04-21 ENCOUNTER — Encounter: Payer: Self-pay | Admitting: Internal Medicine

## 2022-04-21 VITALS — BP 110/82 | HR 70 | Ht 65.0 in | Wt 179.0 lb

## 2022-04-21 DIAGNOSIS — Z95810 Presence of automatic (implantable) cardiac defibrillator: Secondary | ICD-10-CM | POA: Diagnosis not present

## 2022-04-21 DIAGNOSIS — I5022 Chronic systolic (congestive) heart failure: Secondary | ICD-10-CM

## 2022-04-21 DIAGNOSIS — I442 Atrioventricular block, complete: Secondary | ICD-10-CM

## 2022-04-21 NOTE — Patient Instructions (Signed)
Medication Instructions:  - Your physician recommends that you continue on your current medications as directed. Please refer to the Current Medication list given to you today.  *If you need a refill on your cardiac medications before your next appointment, please call your pharmacy*   Lab Work: - none ordered  If you have labs (blood work) drawn today and your tests are completely normal, you will receive your results only by: MyChart Message (if you have MyChart) OR A paper copy in the mail If you have any lab test that is abnormal or we need to change your treatment, we will call you to review the results.   Testing/Procedures: - none ordered   Follow-Up: At [facility], you and your health needs are our priority.  As part of our continuing mission to provide you with exceptional heart care, we have created designated Provider Care Teams.  These Care Teams include your primary Cardiologist (physician) and Advanced Practice Providers (APPs -  Physician Assistants and Nurse Practitioners) who all work together to provide you with the care you need, when you need it.  We recommend signing up for the patient portal called "MyChart".  Sign up information is provided on this After Visit Summary.  MyChart is used to connect with patients for Virtual Visits (Telemedicine).  Patients are able to view lab/test results, encounter notes, upcoming appointments, etc.  Non-urgent messages can be sent to your provider as well.   To learn more about what you can do with MyChart, go to [email or web address].    Your next appointment:   1 year(s)  The format for your next appointment:   In Person  Provider:   Steven Klein, MD    Other Instructions N/a  Important Information About Sugar       

## 2022-04-21 NOTE — Progress Notes (Signed)
Patient Care Team: [name], MD as PCP - General (Family Medicine) [name], MD as PCP - Cardiology (Cardiology) [name], MD as PCP - Electrophysiology (Cardiology) Larey Dresser, MD as PCP - Advanced Heart Failure (Cardiology)   HPI  Sheryl Gonzalez is a 56 y.o. female seen following left bundle branch area pacing-ICD implantation for complete heart block in the setting of cMRI and PET at [facility] consistent with sarcoid     DATE TEST EF   5/22 Echo   60 %   5/22 cMRI 64% LGE septum antero/basilar/ Infero  3/23 PET 75% Hypermetabolic activity--unchanged  8/223 Echo  55%    Has been following up with Dr. Aundra Dubin following initiation of methotrexate and prednisone.  She remains on methotrexate and is off prednisone The patient denies chest pain, shortness of breath, nocturnal dyspnea, orthopnea or peripheral edema.  There have been no palpitations, lightheadedness or syncope. .   Records and Results Reviewed   Past Medical History:  Diagnosis Date   Anxiety    Biventricular ICD (implantable cardioverter-defibrillator) in place s/p placement 11/29/20 11/30/2020   Colon polyps    Iron deficiency anemia due to chronic blood loss    Menorrhagia    Sarcoidosis 11/30/2020   Seasonal allergies    Symptomatic bradycardia 11/30/2020   Wears glasses     Past Surgical History:  Procedure Laterality Date   BIV ICD INSERTION CRT-D N/A 11/29/2020   Procedure: BIV ICD INSERTION CRT-D;  Surgeon: [name], MD;  Location: [facility];  Service: Cardiovascular;  Laterality: N/A;   BREAST BIOPSY  2009   lt   BREAST BIOPSY Left 10/12/2012   Procedure: LEFT NEEDLE LOCALIZATION EXCISIONAL BREAST BIOPSY;  Surgeon: Stark Eunice Oldaker, MD;  Location: [facility];  Service: General;  Laterality: Left;   BREAST DUCTAL SYSTEM EXCISION Left 10/12/2012   Procedure: MAJOR DUCTAL EXCISION;  Surgeon: Stark Ilanna Deihl, MD;  Location: [facility];  Service: General;  Laterality: Left;   COLONOSCOPY     ROBOTIC ASSISTED BILATERAL SALPINGO OOPHERECTOMY Bilateral 06/26/2014   Procedure: ROBOTIC ASSISTED BILATERAL SALPINGECTOMY, RIGHT OOPHORECTOMY;  Surgeon: [name], MD;  Location: [facility];  Service: Gynecology;  Laterality: Bilateral;    Current Meds  Medication Sig   BIOTIN MAXIMUM PO Take 10,000 mcg by mouth daily.   carvedilol (COREG) 3.125 MG tablet Take 1 tablet (3.125 mg total) by mouth 2 (two) times daily.   Cholecalciferol (VITAMIN D3) 25 MCG (1000 UT) CAPS Take 1 capsule by mouth daily.   diphenhydrAMINE (BENADRYL) 25 mg capsule Take 25 mg by mouth every 4 (four) hours as needed for itching.   folic acid (FOLVITE) 1 MG tablet Take 2 mg by mouth daily.   furosemide (LASIX) 20 MG tablet Take 1 tablet (20 mg total) by mouth daily.   methotrexate (RHEUMATREX) 2.5 MG tablet Take 8 tablets (20 mg total) by mouth once a week. Caution:Chemotherapy. Protect from light.   Multiple Vitamins-Minerals (VITAMIN D3 COMPLETE PO) Take 1 tablet by mouth daily.   potassium chloride (KLOR-CON) 10 MEQ tablet Take 1 tablet (10 mEq total) by mouth daily.   vitamin B-12 (CYANOCOBALAMIN) 500 MCG tablet Take 1 tablet (500 mcg total) by mouth every morning.   vitamin E 180 MG (400 UNITS) capsule Take 400 Units by mouth daily.    Allergies  Allergen Reactions   Chlorhexidine Gluconate Itching, Rash and Other (See Comments)    blisters  Review of Systems negative except from HPI and PMH  Physical Exam BP 110/82   Pulse 70   Ht '5\' 5"'$  (1.651 m)   Wt 179 lb (81.2 kg)   SpO2 98%   BMI 29.79 kg/m  Well developed and well nourished in no acute distress HENT normal Neck supple with JVP-flat Clear Device pocket well healed; without hematoma or erythema.  There is no tethering  Regular rate and rhythm, no  gallop No  murmur Abd-soft with active BS No Clubbing cyanosis  edema Skin-warm and dry A & Oriented  Grossly normal  sensory and motor function  ECG sinus with P synchronous pacing at 68 17/13/42      CrCl cannot be calculated (Patient's most recent lab result is older than the maximum 21 days allowed.).   Assessment and  Plan High grade heat block  cMRI/PET abnormal concerning for Sarcoid  ICD-CRT-LBBA Saint Jude    Doing well.  Still on methotrexate, but still with inflammation 3/23.  Being managed together with Dr. Amil Amen and Aundra Dubin.  Device is quiet.  Device function is normal. Programming changes none   See Paceart for detail Still a small rash over the device.  No other signs to suggest infection      Current medicines are reviewed at length with the patient today .  The patient does not  have concerns regarding medicines.

## 2022-05-11 ENCOUNTER — Other Ambulatory Visit (HOSPITAL_COMMUNITY): Payer: Self-pay | Admitting: Cardiology

## 2022-05-14 DIAGNOSIS — N951 Menopausal and female climacteric states: Secondary | ICD-10-CM | POA: Diagnosis not present

## 2022-05-15 LAB — LAB REPORT - SCANNED: EGFR: 65

## 2022-05-21 ENCOUNTER — Other Ambulatory Visit (HOSPITAL_COMMUNITY): Payer: Self-pay | Admitting: Cardiology

## 2022-06-03 ENCOUNTER — Ambulatory Visit (INDEPENDENT_AMBULATORY_CARE_PROVIDER_SITE_OTHER): Payer: BC Managed Care – PPO

## 2022-06-03 DIAGNOSIS — I442 Atrioventricular block, complete: Secondary | ICD-10-CM | POA: Diagnosis not present

## 2022-06-05 LAB — CUP PACEART REMOTE DEVICE CHECK
Battery Remaining Longevity: 68 mo
Battery Remaining Percentage: 76 %
Battery Voltage: 2.99 V
Brady Statistic AP VP Percent: 28 %
Brady Statistic AP VS Percent: 1 %
Brady Statistic AS VP Percent: 72 %
Brady Statistic AS VS Percent: 1 %
Brady Statistic RA Percent Paced: 27 %
Date Time Interrogation Session: 20231123183400
HighPow Impedance: 68 Ohm
HighPow Impedance: 68 Ohm
Implantable Lead Connection Status: 753985
Implantable Lead Connection Status: 753985
Implantable Lead Connection Status: 753985
Implantable Lead Implant Date: 20220520
Implantable Lead Implant Date: 20220520
Implantable Lead Implant Date: 20220520
Implantable Lead Location: 753858
Implantable Lead Location: 753859
Implantable Lead Location: 753860
Implantable Lead Model: 3830
Implantable Pulse Generator Implant Date: 20220520
Lead Channel Impedance Value: 430 Ohm
Lead Channel Impedance Value: 430 Ohm
Lead Channel Impedance Value: 580 Ohm
Lead Channel Pacing Threshold Amplitude: 0.5 V
Lead Channel Pacing Threshold Amplitude: 1 V
Lead Channel Pacing Threshold Amplitude: 2.25 V
Lead Channel Pacing Threshold Pulse Width: 0.05 ms
Lead Channel Pacing Threshold Pulse Width: 0.5 ms
Lead Channel Pacing Threshold Pulse Width: 0.5 ms
Lead Channel Sensing Intrinsic Amplitude: 12 mV
Lead Channel Sensing Intrinsic Amplitude: 4.4 mV
Lead Channel Setting Pacing Amplitude: 0.25 V
Lead Channel Setting Pacing Amplitude: 1.5 V
Lead Channel Setting Pacing Amplitude: 2.5 V
Lead Channel Setting Pacing Pulse Width: 0.05 ms
Lead Channel Setting Pacing Pulse Width: 0.5 ms
Lead Channel Setting Sensing Sensitivity: 0.5 mV
Pulse Gen Serial Number: 9956643
Zone Setting Status: 755011

## 2022-06-23 DIAGNOSIS — G4700 Insomnia, unspecified: Secondary | ICD-10-CM | POA: Diagnosis not present

## 2022-06-23 DIAGNOSIS — N951 Menopausal and female climacteric states: Secondary | ICD-10-CM | POA: Diagnosis not present

## 2022-06-25 NOTE — Progress Notes (Signed)
Remote ICD transmission.   

## 2022-07-28 ENCOUNTER — Ambulatory Visit (HOSPITAL_COMMUNITY)
Admission: RE | Admit: 2022-07-28 | Discharge: 2022-07-28 | Disposition: A | Discharge status: Home / Self Care | Payer: BC Managed Care – PPO | Source: Ambulatory Visit | Attending: Cardiology | Admitting: Cardiology

## 2022-07-28 ENCOUNTER — Encounter (HOSPITAL_COMMUNITY): Payer: Self-pay | Admitting: Cardiology

## 2022-07-28 VITALS — BP 146/88 | HR 73 | Ht 65.0 in | Wt 176.2 lb

## 2022-07-28 DIAGNOSIS — D8685 Sarcoid myocarditis: Secondary | ICD-10-CM | POA: Diagnosis not present

## 2022-07-28 DIAGNOSIS — I5022 Chronic systolic (congestive) heart failure: Secondary | ICD-10-CM

## 2022-07-28 DIAGNOSIS — Z95810 Presence of automatic (implantable) cardiac defibrillator: Secondary | ICD-10-CM | POA: Insufficient documentation

## 2022-07-28 DIAGNOSIS — Z79899 Other long term (current) drug therapy: Secondary | ICD-10-CM | POA: Diagnosis not present

## 2022-07-28 LAB — COMPREHENSIVE METABOLIC PANEL
ALT: 16 U/L (ref 0–44)
AST: 23 U/L (ref 15–41)
Albumin: 4 g/dL (ref 3.5–5.0)
Alkaline Phosphatase: 43 U/L (ref 38–126)
Anion gap: 10 (ref 5–15)
BUN: 13 mg/dL (ref 6–20)
CO2: 22 mmol/L (ref 22–32)
Calcium: 9.1 mg/dL (ref 8.9–10.3)
Chloride: 105 mmol/L (ref 98–111)
Creatinine, Ser: 0.96 mg/dL (ref 0.44–1.00)
GFR, Estimated: >60 mL/min (ref >60)
Glucose, Bld: 88 mg/dL (ref 70–99)
Potassium: 3.9 mmol/L (ref 3.5–5.1)
Sodium: 137 mmol/L (ref 135–145)
Total Bilirubin: 0.5 mg/dL (ref 0.3–1.2)
Total Protein: 6.4 g/dL — ABNORMAL LOW (ref 6.5–8.1)

## 2022-07-28 LAB — LIPID PANEL
Cholesterol: 205 mg/dL — ABNORMAL HIGH (ref 0–200)
HDL: 55 mg/dL (ref >40)
LDL Cholesterol: 135 mg/dL — ABNORMAL HIGH (ref 0–99)
Total CHOL/HDL Ratio: 3.7 RATIO
Triglycerides: 73 mg/dL (ref <150)
VLDL: 15 mg/dL (ref 0–40)

## 2022-07-28 NOTE — Patient Instructions (Signed)
There has no changes to your medications.  Labs done today, your results will be available in MyChart, we will contact you for abnormal readings.  Your provider has ordered a PET SCAN at [facility] for you. Once approved by your insurance company you will be called to have the test arranged.  Your physician recommends that you schedule a follow-up appointment in: 4 months (May 2024)  ** please call the office in March to arrange your follow up appointment **  If you have any questions or concerns before your next appointment please send [name] a message through [place] or call our office at [phone].    TO LEAVE A MESSAGE FOR THE NURSE SELECT OPTION 2, PLEASE LEAVE A MESSAGE INCLUDING: YOUR NAME DATE OF BIRTH CALL BACK NUMBER REASON FOR CALL**this is important as we prioritize the call backs  YOU WILL RECEIVE A CALL BACK THE SAME DAY AS LONG AS YOU CALL BEFORE 4:00 PM  At the [facility], you and your health needs are our priority. As part of our continuing mission to provide you with exceptional heart care, we have created designated Provider Care Teams. These Care Teams include your primary Cardiologist (physician) and Advanced Practice Providers (APPs- Physician Assistants and Nurse Practitioners) who all work together to provide you with the care you need, when you need it.   You may see any of the following providers on your designated Care Team at your next follow up: Dr Glori Bickers Dr Loralie Champagne Dr. Roxana Hires, NP Lyda Jester, [place] [facility] [place], [place] Forestine Na, NP Audry Riles, PharmD   Please be sure to bring in all your medications bottles to every appointment.

## 2022-07-28 NOTE — Progress Notes (Signed)
PCP: [name], MD EP: Dr. Caryl Comes HF cardiology: Dr. Aundra Dubin  57 y.o. with history of complete heart block s/p St Jude BiV ICD placement and suspected cardiac sarcoidosis was referred by Dr. Caryl Comes for management of cardiac sarcoidosis. Patient generally has been in good health, no major medical problems until 5/22.  In 5/22, she developed "dizzy spells."  She would check her pulse and find it to be in the 30s.  She did not pass out.  She had an ECG at her PCP's office showing complete heart block and was admitted.  Echo showed normal LV systolic function.  Cardiac MRI was done due to concern for possible cardiac sarcoidosis as cause of CHB in a relatively young woman.  Cardiac MRI showed a mid-wall septal LGE pattern with elevated septal T2 signal that was concerning for cardiac sarcoidosis.  Patient had implantation of St Jude CRT-D device.  She then had a cardiac PET at [facility] which showed inflammation in the septum and the RV free wall along with a septal perfusion defect.  She was seen by Dr. Caryl Comes and was started on prednisone and methotrexate for treatment of high probability cardiac sarcoidosis.   CT chest in 9/22 did not show signs of pulmonary sarcoidosis.   Repeat cardiac PET in 3/23 showed EF 59%, several areas of hypermetabolic activity suggestive of active inflammatory process in the LV, similar to prior cPET.   Echo in 8/23 showed EF 55%, basal septal hypokinesis, mildly decreased RV systolic function.  Patient returns for followup of cardiac sarcoidosis.  She is now on 20 mg MTX and is off prednisone.  She has been feeling good overall, no exertional dyspnea or chest pain.  She has lost about 7 lbs.  No cough.    ECG (personally reviewed): NSR with BiV pacing.   Labs (5/22): K 4, creatinine 0.98 Labs (11/22): K 4.7, creatinine 1.18, LFTs normal Labs (12/22): hgb 13.2, K 4.2, creatinine 1.2 Labs (2/23): LFTs normal, K 4, creatinine 1.08 Labs (7/23): K 3.9, creatinine 0.93, LFTs  normal, hgb 12.3 Labs (8/23): ACE level 82 (upper normal), hgb 12.5, K 4.1, creatinine 0.92, LFTs normal  PMH: 1. H/o Shingles 2. Ovarian cyst 3. Complete heart block: Thought to be due to cardiac sarcoidosis, s/p placement of St Jude CRT-D device.  4. Cardiac sarcoidosis: Strong suspicion.  Patient presented in 5/22 with complete heart block.  - Echo (5/22): EF 60-65%, normal RV, PASP 36 mmHg.  - Cardiac MRI (5/22): LV EF 64%, increased T2 signal in septum, mid-wall septal LGE (non-MI pattern), RV EF 61%.  - Cardiac PET (7/22): LV EF 57%, inflammation in 57% of the septal wall and 17% of the total LV, inflammation also involved the RV free wall.  There was a septal perfusion defect. This was concerning for possible cardiac sarcoidosis.  - High resolution CT chest (9/22): No signs of sarcoidosis, small RUL nodule.  - Cardiac PET (3/23): EF 59%, several areas of hypermetabolic activity suggestive of active inflammatory process in the LV, similar to prior cPET.  - Echo (8/23): EF 55%, basal septal hypokinesis, mildly decreased RV systolic function. 5. Lung nodule: following with serial CTs  SH: Single, [place] from [place] originally, now lives in [place], nonsmoker, no ETOH/drugs, working full time.   FH: No family history of sarcoidosis or cardiomyopathy.   ROS: All systems reviewed and negative except as per HPI.   Current Outpatient Medications  Medication Sig Dispense Refill   BIOTIN MAXIMUM PO Take 10,000 mcg  by mouth daily.     Black Cohosh 540 MG CAPS Take 540 mg by mouth daily in the afternoon.     carvedilol (COREG) 3.125 MG tablet TAKE 1 TABLET BY MOUTH TWICE A DAY 180 tablet 3   Cholecalciferol (VITAMIN D3) 25 MCG (1000 UT) CAPS Take 1 capsule by mouth daily.     diphenhydrAMINE (BENADRYL) 25 mg capsule Take 25 mg by mouth every 4 (four) hours as needed for itching.     folic acid (FOLVITE) 1 MG tablet Take 2 mg by mouth daily.     furosemide (LASIX) 20 MG tablet TAKE  1 TABLET BY MOUTH ONCE A DAY 90 tablet 3   methotrexate (RHEUMATREX) 2.5 MG tablet Take 8 tablets (20 mg total) by mouth once a week. Caution:Chemotherapy. Protect from light. 94 tablet 3   Multiple Vitamins-Minerals (VITAMIN D3 COMPLETE PO) Take 1 tablet by mouth daily.     potassium chloride (KLOR-CON) 10 MEQ tablet TAKE 1 TABLET BY MOUTH ONCE A DAY 90 tablet 0   venlafaxine (EFFEXOR) 37.5 MG tablet Take 37.5 mg by mouth daily.     vitamin B-12 (CYANOCOBALAMIN) 500 MCG tablet Take 1 tablet (500 mcg total) by mouth every morning.     vitamin E 180 MG (400 UNITS) capsule Take 400 Units by mouth daily.     No current facility-administered medications for this encounter.   BP (!) 146/88   Pulse 73   Ht '5\' 5"'$  (1.651 m)   Wt 79.9 kg (176 lb 3.2 oz)   SpO2 100%   BMI 29.32 kg/m  General: NAD Neck: No JVD, no thyromegaly or thyroid nodule.  Lungs: Clear to auscultation bilaterally with normal respiratory effort. CV: Nondisplaced PMI.  Heart regular S1/S2, no S3/S4, no murmur.  No peripheral edema.  No carotid bruit.  Normal pedal pulses.  Abdomen: Soft, nontender, no hepatosplenomegaly, no distention.  Skin: Intact without lesions or rashes.  Neurologic: Alert and oriented x 3.  Psych: Normal affect. Extremities: No clubbing or cyanosis.  HEENT: Normal.   Assessment/Plan: 1. Complete heart block: In a young woman, there was concern for cardiac sarcoidosis as the cause.  Workup with cMRI and cPET was highly concerning for cardiac sarcoidosis.  She now has a Research officer, political party CRT-D device.   - Dr. Caryl Comes follows CRT-D device.  2. Cardiac sarcoidosis: Strong suspicion for cardiac sarcoidosis in a relatively young woman with complete heart block and both cMRI and cPET that are consistent with cardiac sarcoidosis.  She does not have evidence for extracardiac sarcoidosis; 25% of cardiac sarcoidosis, however, is isolated to the heart.  No skin lesions and exam by eye doctor was unremarkable per her report.   No pulmonary symptoms, CT chest in 9/22 did not show signs of sarcoidosis.   We do not have a tissue diagnosis, but endomyocardial biopsy has a relatively low sensitivity in cardiac sarcoidosis.  Given high probability CS, we have treated her based on the data that we have. She is now off prednisone and on goal dose MTX.  Repeat cPET in 3/23 showed EF 59%, several areas of hypermetabolic activity suggestive of active inflammatory process in the LV, similar to prior cPET. Echo today with EF 55%, basal septal hypokinesis (consistent with septal site for LGE on cMRI).  No dyspnea or chest pain.  - She is seeing rheumatology (Dr Amil Amen).  - Continue MTX 20 mg weekly.  She is tolerating this well.  - She will need q 3 month LFTs with MTX  at this point (check today).   - Continue Coreg 3.125 mg bid with significant delayed enhancement burden and risk for VT.  - I will arrange for repeat cardiac PET to follow disease activity in 3/24 at [facility].   - Continue Lasix 20 mg daily with KCl 10 daily.  BMET today.  3. Pulmonary nodule: Stable nodule on 5/23 CT.  - Repeat CT chest w/o contrast in 2 yrs (5/25).   Followup in after PET.   Loralie Champagne 07/28/2022

## 2022-07-30 ENCOUNTER — Telehealth (HOSPITAL_COMMUNITY): Payer: Self-pay | Admitting: Cardiology

## 2022-07-30 DIAGNOSIS — E785 Hyperlipidemia, unspecified: Secondary | ICD-10-CM

## 2022-07-30 NOTE — Telephone Encounter (Signed)
Patient called.  Patient aware. Order placed

## 2022-07-30 NOTE — Telephone Encounter (Signed)
-----  Message from Larey Dresser, MD sent at 07/29/2022  8:21 AM EST ----- LDL has been rising.  Would be reasonable to get coronary calcium score scan to risk stratify for need for statin.  Would set up if she is amenable.

## 2022-08-04 ENCOUNTER — Telehealth (HOSPITAL_COMMUNITY): Payer: Self-pay | Admitting: *Deleted

## 2022-08-04 DIAGNOSIS — E785 Hyperlipidemia, unspecified: Secondary | ICD-10-CM

## 2022-08-04 NOTE — Telephone Encounter (Signed)
Calling to cancel her CCTA appt since the incorrect CT was ordered.  Left message with name and call back number.  Gordy Clement RN Navigator Cardiac Imaging [facility] [phone] Office [phone] Cell

## 2022-08-04 NOTE — Telephone Encounter (Signed)
Order placed for cardiac score ct

## 2022-08-05 ENCOUNTER — Telehealth (HOSPITAL_COMMUNITY): Payer: Self-pay

## 2022-08-05 NOTE — Telephone Encounter (Signed)
Sheryl Gonzalez called [name] to find out when the new scan will be ordered. She stated someone was handling this and was supposed to call her back and your note was the one I found. Are you following up with her or do we need to do something further?

## 2022-08-05 NOTE — Telephone Encounter (Signed)
Left message for Sheryl Gonzalez letting her know order has  been placed and scheduling will be calling her.

## 2022-08-06 ENCOUNTER — Encounter (HOSPITAL_COMMUNITY): Payer: Self-pay

## 2022-08-06 ENCOUNTER — Ambulatory Visit (HOSPITAL_COMMUNITY): Admission: RE | Admit: 2022-08-06 | Payer: BC Managed Care – PPO | Source: Ambulatory Visit

## 2022-08-13 ENCOUNTER — Ambulatory Visit
Admission: RE | Admit: 2022-08-13 | Discharge: 2022-08-13 | Disposition: A | Discharge status: Home / Self Care | Payer: BC Managed Care – PPO | Source: Ambulatory Visit | Attending: Cardiology | Admitting: Cardiology

## 2022-08-13 DIAGNOSIS — E785 Hyperlipidemia, unspecified: Secondary | ICD-10-CM | POA: Insufficient documentation

## 2022-08-17 ENCOUNTER — Telehealth (HOSPITAL_COMMUNITY): Payer: Self-pay

## 2022-08-17 NOTE — Telephone Encounter (Signed)
Left message for patient about CT results.

## 2022-08-21 ENCOUNTER — Telehealth (HOSPITAL_COMMUNITY): Payer: Self-pay

## 2022-08-21 ENCOUNTER — Telehealth (HOSPITAL_COMMUNITY): Payer: Self-pay | Admitting: *Deleted

## 2022-08-21 NOTE — Telephone Encounter (Signed)
Referral sent to [facility] for PET SCAN

## 2022-08-21 NOTE — Telephone Encounter (Signed)
PET SCAN auth   Order ID: [id number]       Authorized  Approval Valid Through: 08/21/2022 - 09/19/2022

## 2022-08-24 ENCOUNTER — Other Ambulatory Visit (HOSPITAL_COMMUNITY): Payer: Self-pay | Admitting: Cardiology

## 2022-09-02 ENCOUNTER — Ambulatory Visit (INDEPENDENT_AMBULATORY_CARE_PROVIDER_SITE_OTHER): Payer: BC Managed Care – PPO

## 2022-09-02 ENCOUNTER — Encounter: Payer: Self-pay | Admitting: Family Medicine

## 2022-09-02 ENCOUNTER — Ambulatory Visit: Payer: BC Managed Care – PPO | Admitting: Family Medicine

## 2022-09-02 VITALS — BP 124/78 | HR 67 | Temp 97.9°F | Ht 65.0 in | Wt 173.0 lb

## 2022-09-02 DIAGNOSIS — J069 Acute upper respiratory infection, unspecified: Secondary | ICD-10-CM | POA: Diagnosis not present

## 2022-09-02 DIAGNOSIS — I442 Atrioventricular block, complete: Secondary | ICD-10-CM | POA: Diagnosis not present

## 2022-09-02 LAB — CUP PACEART REMOTE DEVICE CHECK
Battery Remaining Longevity: 66 mo
Battery Remaining Percentage: 73 %
Battery Voltage: 2.99 V
Brady Statistic AP VP Percent: 29 %
Brady Statistic AP VS Percent: 1 %
Brady Statistic AS VP Percent: 70 %
Brady Statistic AS VS Percent: 1 %
Brady Statistic RA Percent Paced: 28 %
Date Time Interrogation Session: 20240221020015
HighPow Impedance: 74 Ohm
HighPow Impedance: 74 Ohm
Implantable Lead Connection Status: 753985
Implantable Lead Connection Status: 753985
Implantable Lead Connection Status: 753985
Implantable Lead Implant Date: 20220520
Implantable Lead Implant Date: 20220520
Implantable Lead Implant Date: 20220520
Implantable Lead Location: 753858
Implantable Lead Location: 753859
Implantable Lead Location: 753860
Implantable Lead Model: 3830
Implantable Pulse Generator Implant Date: 20220520
Lead Channel Impedance Value: 410 Ohm
Lead Channel Impedance Value: 430 Ohm
Lead Channel Impedance Value: 580 Ohm
Lead Channel Pacing Threshold Amplitude: 0.625 V
Lead Channel Pacing Threshold Amplitude: 1 V
Lead Channel Pacing Threshold Amplitude: 2.25 V
Lead Channel Pacing Threshold Pulse Width: 0.05 ms
Lead Channel Pacing Threshold Pulse Width: 0.5 ms
Lead Channel Pacing Threshold Pulse Width: 0.5 ms
Lead Channel Sensing Intrinsic Amplitude: 12 mV
Lead Channel Sensing Intrinsic Amplitude: 4 mV
Lead Channel Setting Pacing Amplitude: 0.25 V
Lead Channel Setting Pacing Amplitude: 1.625
Lead Channel Setting Pacing Amplitude: 2.5 V
Lead Channel Setting Pacing Pulse Width: 0.05 ms
Lead Channel Setting Pacing Pulse Width: 0.5 ms
Lead Channel Setting Sensing Sensitivity: 0.5 mV
Pulse Gen Serial Number: 9956643
Zone Setting Status: 755011

## 2022-09-02 MED ORDER — BENZONATATE 200 MG PO CAPS: 200.0000 mg | ORAL_CAPSULE | Freq: Two times a day (BID) | ORAL | 0 refills | Status: DC | PRN

## 2022-09-02 NOTE — Assessment & Plan Note (Addendum)
Declines viral testing. Reassured patient that symptoms and exam findings are most consistent with a viral upper respiratory infection and explained lack of efficacy of antibiotics against viruses.  Discussed expected course and features suggestive of secondary bacterial infection.  Continue supportive care. Increase fluid intake with water or electrolyte solution like pedialyte. Encouraged acetaminophen as needed for fever/pain. Encouraged salt water gargling, chloraseptic spray and throat lozenges. Encouraged OTC guaifenesin. Encouraged saline sinus flushes and/or neti with humidified air.

## 2022-09-02 NOTE — Patient Instructions (Signed)

## 2022-09-02 NOTE — Progress Notes (Signed)
Acute Office Visit  Subjective:     Patient ID: Sheryl Gonzalez, female    DOB: 1965/08/23, 57 y.o.   MRN: [id number]  Chief Complaint  Patient presents with   Acute Visit    tickle in throat, drainage and coughing. OTC meds not helping - JBG\\\      HPI Patient is in today for 3 days of tickle in her throat, sneezing, dry cough, chills Denies rhinorrhea, fever, body aches, shortness of breath, wheezing, chest pain No known sick exposures, no home covid test Has tried Delsym and Tussinex  Review of Systems  All other systems reviewed and are negative.   Past Medical History:  Diagnosis Date   Anxiety    Biventricular ICD (implantable cardioverter-defibrillator) in place s/p placement 11/29/20 11/30/2020   Colon polyps    Iron deficiency anemia due to chronic blood loss    Menorrhagia    Sarcoidosis 11/30/2020   Seasonal allergies    Symptomatic bradycardia 11/30/2020   Wears glasses    Past Surgical History:  Procedure Laterality Date   BIV ICD INSERTION CRT-D N/A 11/29/2020   Procedure: BIV ICD INSERTION CRT-D;  Surgeon: [name], MD;  Location: [facility];  Service: Cardiovascular;  Laterality: N/A;   BREAST BIOPSY  2009   lt   BREAST BIOPSY Left 10/12/2012   Procedure: LEFT NEEDLE LOCALIZATION EXCISIONAL BREAST BIOPSY;  Surgeon: Stark Klein, MD;  Location: [facility];  Service: General;  Laterality: Left;   BREAST DUCTAL SYSTEM EXCISION Left 10/12/2012   Procedure: MAJOR DUCTAL EXCISION;  Surgeon: Stark Klein, MD;  Location: [facility];  Service: General;  Laterality: Left;   COLONOSCOPY     ROBOTIC ASSISTED BILATERAL SALPINGO OOPHERECTOMY Bilateral 06/26/2014   Procedure: ROBOTIC ASSISTED BILATERAL SALPINGECTOMY, RIGHT OOPHORECTOMY;  Surgeon: [name], MD;  Location: [facility];  Service: Gynecology;  Laterality: Bilateral;   Current Outpatient Medications on File Prior to Visit  Medication Sig Dispense Refill    BIOTIN MAXIMUM PO Take 10,000 mcg by mouth daily.     Black Cohosh 540 MG CAPS Take 540 mg by mouth daily in the afternoon.     carvedilol (COREG) 3.125 MG tablet TAKE 1 TABLET BY MOUTH TWICE A DAY 180 tablet 3   Cholecalciferol (VITAMIN D3) 25 MCG (1000 UT) CAPS Take 1 capsule by mouth daily.     diphenhydrAMINE (BENADRYL) 25 mg capsule Take 25 mg by mouth every 4 (four) hours as needed for itching.     folic acid (FOLVITE) 1 MG tablet Take 2 mg by mouth daily.     furosemide (LASIX) 20 MG tablet TAKE 1 TABLET BY MOUTH ONCE A DAY 90 tablet 3   methotrexate (RHEUMATREX) 2.5 MG tablet Take 8 tablets (20 mg total) by mouth once a week. Caution:Chemotherapy. Protect from light. 94 tablet 3   Multiple Vitamins-Minerals (VITAMIN D3 COMPLETE PO) Take 1 tablet by mouth daily.     potassium chloride (KLOR-CON) 10 MEQ tablet TAKE 1 TABLET BY MOUTH ONCE A DAY 90 tablet 0   venlafaxine (EFFEXOR) 37.5 MG tablet Take 37.5 mg by mouth daily.     vitamin B-12 (CYANOCOBALAMIN) 500 MCG tablet Take 1 tablet (500 mcg total) by mouth every morning.     vitamin E 180 MG (400 UNITS) capsule Take 400 Units by mouth daily.     No current facility-administered medications on file prior to visit.   Allergies  Allergen Reactions   Chlorhexidine Gluconate Itching, Rash  and Other (See Comments)    blisters        Objective:    BP 124/78   Pulse 67   Temp 97.9 F (36.6 C) (Oral)   Ht 5' 5"$  (1.651 m)   Wt 173 lb (78.5 kg)   SpO2 98%   BMI 28.79 kg/m    Physical Exam Vitals and nursing note reviewed.  Constitutional:      Appearance: Normal appearance. She is normal weight.  HENT:     Head: Normocephalic and atraumatic.     Right Ear: Tympanic membrane, ear canal and external ear normal.     Left Ear: Tympanic membrane, ear canal and external ear normal.     Nose: Nose normal.     Mouth/Throat:     Mouth: Mucous membranes are moist.     Pharynx: Oropharynx is clear.  Eyes:     Conjunctiva/sclera:  Conjunctivae normal.  Cardiovascular:     Rate and Rhythm: Normal rate and regular rhythm.     Pulses: Normal pulses.     Heart sounds: Normal heart sounds.  Pulmonary:     Effort: Pulmonary effort is normal.     Breath sounds: Normal breath sounds.  Musculoskeletal:     Cervical back: No tenderness.  Lymphadenopathy:     Cervical: No cervical adenopathy.  Skin:    General: Skin is warm and dry.  Neurological:     General: No focal deficit present.     Mental Status: She is alert and oriented to person, place, and time. Mental status is at baseline.  Psychiatric:        Mood and Affect: Mood normal.        Behavior: Behavior normal.        Thought Content: Thought content normal.        Judgment: Judgment normal.     Results for orders placed or performed in visit on 09/02/22  CUP PACEART REMOTE DEVICE CHECK  Result Value Ref Range   Date Time Interrogation Session [phone]    Pulse Generator Manufacturer [facility]    Pulse Gen Model 3357-40Q Unify Assura    Pulse Gen Serial Number [id number]    Clinic Name [facility]    Implantable Pulse Generator Type Cardiac Resynch Therapy Defibulator    Implantable Pulse Generator Implant Date AZ:[id number]    Implantable Lead Manufacturer [facility]    Implantable Lead Model 3830 SelectSecure    Implantable Lead Serial Number [id number]    Implantable Lead Implant Date AZ:[id number]    Implantable Lead Location Detail 1 UNKNOWN    Implantable Lead Special Function LBBB    Implantable Lead Location [id number]    Implantable Lead Connection Status [id number]    Implantable Lead Manufacturer [facility]    Implantable Lead Model [id number] Durata SJ4    Implantable Lead Serial Number [id number]    Implantable Lead Implant Date AZ:[id number]    Implantable Lead Location Detail 1 APPENDAGE    Implantable Lead Location [id number]    Implantable Lead Connection Status [id number]    Implantable Lead Manufacturer [facility]    Implantable Lead Model [id number] Tendril MRI    Implantable  Lead Serial Number [id number]    Implantable Lead Implant Date AZ:[id number]    Implantable Lead Location Detail 1 APEX    Implantable Lead Location [id number]    Implantable Lead Connection Status [id number]    Lead Channel Setting Sensing Sensitivity 0.5 mV   Lead Channel Setting Sensing Adaptation Mode Adaptive Sensing    Lead Channel  Setting Pacing Amplitude 1.625    Lead Channel Setting Pacing Pulse Width 0.05 ms   Lead Channel Setting Pacing Amplitude 0.25 V   Lead Channel Setting Pacing Pulse Width 0.5 ms   Lead Channel Setting Pacing Amplitude 2.5 V   Lead Channel Setting Pacing Capture Mode Fixed Pacing    Zone Setting Status Active    Zone Setting Status Active    Zone Setting Status [phone]    Lead Channel Status NULL    Lead Channel Impedance Value 430 ohm   Lead Channel Pacing Threshold Amplitude 1.0 V   Lead Channel Pacing Threshold Pulse Width 0.5 ms   Lead Channel Status NULL    Lead Channel Impedance Value 410 ohm   Lead Channel Sensing Intrinsic Amplitude 4.0 mV   Lead Channel Pacing Threshold Amplitude 0.625 V   Lead Channel Pacing Threshold Pulse Width 0.5 ms   Lead Channel Status NULL    Lead Channel Impedance Value 580 ohm   Lead Channel Sensing Intrinsic Amplitude 12.0 mV   Lead Channel Pacing Threshold Amplitude 2.25 V   Lead Channel Pacing Threshold Pulse Width 0.05 ms   HighPow Impedance 74 ohm   HighPow Impedance 74 ohm   HighPow Imped Status NULL    HighPow Imped Status NULL    Battery Status MOS    Battery Remaining Longevity 66 mo   Battery Remaining Percentage 73.0 %   Battery Voltage 2.99 V   Brady Statistic RA Percent Paced 28.0 %   Brady Statistic AP VP Percent 29.0 %   Brady Statistic AS VP Percent 70.0 %   Brady Statistic AP VS Percent 1.0 %   Brady Statistic AS VS Percent 1.0 %        Assessment & Plan:   Problem List Items Addressed This Visit       Respiratory   Viral URI with cough - Primary    Declines viral testing. Reassured patient  that symptoms and exam findings are most consistent with a viral upper respiratory infection and explained lack of efficacy of antibiotics against viruses.  Discussed expected course and features suggestive of secondary bacterial infection.  Continue supportive care. Increase fluid intake with water or electrolyte solution like pedialyte. Encouraged acetaminophen as needed for fever/pain. Encouraged salt water gargling, chloraseptic spray and throat lozenges. Encouraged OTC guaifenesin. Encouraged saline sinus flushes and/or neti with humidified air.         Meds ordered this encounter  Medications   benzonatate (TESSALON) 200 MG capsule    Sig: Take 1 capsule (200 mg total) by mouth 2 (two) times daily as needed for cough.    Dispense:  20 capsule    Refill:  0    Order Specific Question:   Supervising Provider    Answer:   Jenna Luo T [id number]    Return if symptoms worsen or fail to improve.  Rubie Maid, FNP

## 2022-09-09 ENCOUNTER — Other Ambulatory Visit: Payer: Self-pay | Admitting: *Deleted

## 2022-09-14 DIAGNOSIS — Z79899 Other long term (current) drug therapy: Secondary | ICD-10-CM | POA: Diagnosis not present

## 2022-09-14 DIAGNOSIS — Z7952 Long term (current) use of systemic steroids: Secondary | ICD-10-CM | POA: Diagnosis not present

## 2022-09-14 DIAGNOSIS — D8685 Sarcoid myocarditis: Secondary | ICD-10-CM | POA: Diagnosis not present

## 2022-10-01 NOTE — Progress Notes (Signed)
Remote ICD transmission.   

## 2022-10-15 ENCOUNTER — Telehealth: Payer: Self-pay | Admitting: *Deleted

## 2022-10-15 NOTE — Telephone Encounter (Signed)
NEW PET SCAN AUTH   Order ID: [id number]       Authorized  Approval Valid Through: 10/05/2022 - 11/03/2022

## 2022-11-02 DIAGNOSIS — D8685 Sarcoid myocarditis: Secondary | ICD-10-CM | POA: Diagnosis not present

## 2022-11-02 DIAGNOSIS — E785 Hyperlipidemia, unspecified: Secondary | ICD-10-CM | POA: Diagnosis not present

## 2022-11-02 DIAGNOSIS — Z95810 Presence of automatic (implantable) cardiac defibrillator: Secondary | ICD-10-CM | POA: Diagnosis not present

## 2022-11-02 DIAGNOSIS — R9439 Abnormal result of other cardiovascular function study: Secondary | ICD-10-CM | POA: Diagnosis not present

## 2022-11-05 ENCOUNTER — Telehealth: Payer: BC Managed Care – PPO | Admitting: Family Medicine

## 2022-11-05 DIAGNOSIS — J019 Acute sinusitis, unspecified: Secondary | ICD-10-CM

## 2022-11-05 DIAGNOSIS — B9689 Other specified bacterial agents as the cause of diseases classified elsewhere: Secondary | ICD-10-CM

## 2022-11-05 MED ORDER — AMOXICILLIN-POT CLAVULANATE 875-125 MG PO TABS: 1.0000 | ORAL_TABLET | Freq: Two times a day (BID) | ORAL | 0 refills | Status: AC

## 2022-11-05 MED ORDER — BENZONATATE 200 MG PO CAPS: 200.0000 mg | ORAL_CAPSULE | Freq: Two times a day (BID) | ORAL | 0 refills | Status: DC | PRN

## 2022-11-05 NOTE — Progress Notes (Signed)
Virtual Visit Consent   Sheryl Gonzalez, you are scheduled for a virtual visit with a [facility] provider today. Just as with appointments in the office, your consent must be obtained to participate. Your consent will be active for this visit and any virtual visit you may have with one of our providers in the next 365 days. If you have a MyChart account, a copy of this consent can be sent to you electronically.  As this is a virtual visit, video technology does not allow for your provider to perform a traditional examination. This may limit your provider's ability to fully assess your condition. If your provider identifies any concerns that need to be evaluated in person or the need to arrange testing (such as labs, EKG, etc.), we will make arrangements to do so. Although advances in technology are sophisticated, we cannot ensure that it will always work on either your end or our end. If the connection with a video visit is poor, the visit may have to be switched to a telephone visit. With either a video or telephone visit, we are not always able to ensure that we have a secure connection.  By engaging in this virtual visit, you consent to the provision of healthcare and authorize for your insurance to be billed (if applicable) for the services provided during this visit. Depending on your insurance coverage, you may receive a charge related to this service.  I need to obtain your verbal consent now. Are you willing to proceed with your visit today? Sheryl Gonzalez has provided verbal consent on 11/05/2022 for a virtual visit (video or telephone). Sheryl Finner, NP  Date: 11/05/2022 2:15 PM  Virtual Visit via Video Note   I, Sheryl Gonzalez, connected with  Sheryl Gonzalez  ([id number], 16-Nov-1965) on 11/05/22 at  2:15 PM EDT by a video-enabled telemedicine application and verified that I am speaking with the correct person using two identifiers.  Location: Patient: Virtual Visit Location  Patient: Home Provider: Virtual Visit Location Provider: Home Office   I discussed the limitations of evaluation and management by telemedicine and the availability of in person appointments. The patient expressed understanding and agreed to proceed.    History of Present Illness: Sheryl Gonzalez is a 57 y.o. who identifies as a female who was assigned female at birth, and is being seen today for sinus infection  Onset was drainage, coughing at night- 5 days ago. Was at doctor 2 months ago and used Perles that helped- but not helping much now. Associated symptoms are nasal tone, congestion,  Modifying factors are OTC allergy medication,  Denies chest pain, shortness of breath, fevers, chills, ear pain or sore throat  Does walk her dog in the evenings. Developed allergies later in life.  Exposure to sick contacts- unknown   Problems:  Patient Active Problem List   Diagnosis Date Noted   Viral URI with cough 09/02/2022   Symptomatic bradycardia 11/30/2020   Biventricular ICD (implantable cardioverter-defibrillator) in place s/p placement 11/29/20 11/30/2020   Sarcoidosis 11/30/2020   Complete heart block 11/28/2020   Menopausal symptoms 07/16/2014   Ovarian mass, right 05/24/2014   Nipple discharge, bloody 08/22/2012   Allergic rhinitis 05/31/2012   DEPRESSION 05/30/2008   ACUTE SINUSITIS, UNSPECIFIED 05/30/2008    Allergies:  Allergies  Allergen Reactions   Chlorhexidine Gluconate Itching, Rash and Other (See Comments)    blisters   Medications:  Current Outpatient Medications:    benzonatate (TESSALON) 200 MG capsule, Take  1 capsule (200 mg total) by mouth 2 (two) times daily as needed for cough., Disp: 20 capsule, Rfl: 0   BIOTIN MAXIMUM PO, Take 10,000 mcg by mouth daily., Disp: , Rfl:    Black Cohosh 540 MG CAPS, Take 540 mg by mouth daily in the afternoon., Disp: , Rfl:    carvedilol (COREG) 3.125 MG tablet, TAKE 1 TABLET BY MOUTH TWICE A DAY, Disp: 180 tablet, Rfl:  3   Cholecalciferol (VITAMIN D3) 25 MCG (1000 UT) CAPS, Take 1 capsule by mouth daily., Disp: , Rfl:    diphenhydrAMINE (BENADRYL) 25 mg capsule, Take 25 mg by mouth every 4 (four) hours as needed for itching., Disp: , Rfl:    folic acid (FOLVITE) 1 MG tablet, Take 2 mg by mouth daily., Disp: , Rfl:    furosemide (LASIX) 20 MG tablet, TAKE 1 TABLET BY MOUTH ONCE A DAY, Disp: 90 tablet, Rfl: 3   methotrexate (RHEUMATREX) 2.5 MG tablet, Take 8 tablets (20 mg total) by mouth once a week. Caution:Chemotherapy. Protect from light., Disp: 94 tablet, Rfl: 3   Multiple Vitamins-Minerals (VITAMIN D3 COMPLETE PO), Take 1 tablet by mouth daily., Disp: , Rfl:    potassium chloride (KLOR-CON) 10 MEQ tablet, TAKE 1 TABLET BY MOUTH ONCE A DAY, Disp: 90 tablet, Rfl: 0   venlafaxine (EFFEXOR) 37.5 MG tablet, Take 37.5 mg by mouth daily., Disp: , Rfl:    vitamin B-12 (CYANOCOBALAMIN) 500 MCG tablet, Take 1 tablet (500 mcg total) by mouth every morning., Disp: , Rfl:    vitamin E 180 MG (400 UNITS) capsule, Take 400 Units by mouth daily., Disp: , Rfl:   Observations/Objective: Patient is well-developed, well-nourished in no acute distress.  Resting comfortably  at home.  Head is normocephalic, atraumatic.  No labored breathing.  Speech is clear and coherent with logical content.  Patient is alert and oriented at baseline.    Assessment and Plan:   1. Acute bacterial sinusitis  - amoxicillin-clavulanate (AUGMENTIN) 875-125 MG tablet; Take 1 tablet by mouth 2 (two) times daily for 7 days.  Dispense: 14 tablet; Refill: 0 - benzonatate (TESSALON) 200 MG capsule; Take 1 capsule (200 mg total) by mouth 2 (two) times daily as needed for cough.  Dispense: 20 capsule; Refill: 0  -Take meds as prescribed -Rest -Use a cool mist humidifier especially during the winter months when heat dries out the air. - Use saline nose sprays frequently to help soothe nasal passages and promote drainage. -Saline irrigations of  the nose can be very helpful if done frequently.             * 4X daily for 1 week*             * Use of a nettie pot can be helpful with this.  *Follow directions with this* *Boiled or distilled water only -stay hydrated by drinking plenty of fluids - Keep thermostat turn down low to prevent drying out sinuses - For any cough or congestion- robitussin DM or Delsym as needed - For fever or aches or pains- take tylenol or ibuprofen as directed on bottle             * for fevers greater than 101 orally you may alternate ibuprofen and tylenol every 3 hours.  If you do not improve you will need a follow up visit in person.               Reviewed side effects, risks and benefits of medication.  Patient acknowledged agreement and understanding of the plan.   Past Medical, Surgical, Social History, Allergies, and Medications have been Reviewed.      Follow Up Instructions: I discussed the assessment and treatment plan with the patient. The patient was provided an opportunity to ask questions and all were answered. The patient agreed with the plan and demonstrated an understanding of the instructions.  A copy of instructions were sent to the patient via MyChart unless otherwise noted below.    The patient was advised to call back or seek an in-person evaluation if the symptoms worsen or if the condition fails to improve as anticipated.  Time:  I spent 10 minutes with the patient via telehealth technology discussing the above problems/concerns.    Sheryl Finner, NP

## 2022-11-05 NOTE — Patient Instructions (Signed)
Jessicah A Kerin Salenyou for joining Freddy Finner, NP for today's virtual visit.  While this provider is not your primary care provider (PCP), if your PCP is located in our provider database this encounter information will be shared with them immediately following your visit.   A [facility] MyChart account gives you access to today's visit and all your visits, tests, and labs performed at [facility] " click here if you don't have a [facility] MyChart account or go to [email or web address]  Consent: (Patient) Sheryl Gonzalez provided verbal consent for this virtual visit at the beginning of the encounter.  Current Medications:  Current Outpatient Medications:    [START ON 11/07/2022] amoxicillin-clavulanate (AUGMENTIN) 875-125 MG tablet, Take 1 tablet by mouth 2 (two) times daily for 7 days., Disp: 14 tablet, Rfl: 0   benzonatate (TESSALON) 200 MG capsule, Take 1 capsule (200 mg total) by mouth 2 (two) times daily as needed for cough., Disp: 20 capsule, Rfl: 0   BIOTIN MAXIMUM PO, Take 10,000 mcg by mouth daily., Disp: , Rfl:    Black Cohosh 540 MG CAPS, Take 540 mg by mouth daily in the afternoon., Disp: , Rfl:    carvedilol (COREG) 3.125 MG tablet, TAKE 1 TABLET BY MOUTH TWICE A DAY, Disp: 180 tablet, Rfl: 3   Cholecalciferol (VITAMIN D3) 25 MCG (1000 UT) CAPS, Take 1 capsule by mouth daily., Disp: , Rfl:    diphenhydrAMINE (BENADRYL) 25 mg capsule, Take 25 mg by mouth every 4 (four) hours as needed for itching., Disp: , Rfl:    folic acid (FOLVITE) 1 MG tablet, Take 2 mg by mouth daily., Disp: , Rfl:    furosemide (LASIX) 20 MG tablet, TAKE 1 TABLET BY MOUTH ONCE A DAY, Disp: 90 tablet, Rfl: 3   methotrexate (RHEUMATREX) 2.5 MG tablet, Take 8 tablets (20 mg total) by mouth once a week. Caution:Chemotherapy. Protect from light., Disp: 94 tablet, Rfl: 3   Multiple Vitamins-Minerals (VITAMIN D3 COMPLETE PO), Take 1 tablet by mouth daily., Disp: , Rfl:    potassium  chloride (KLOR-CON) 10 MEQ tablet, TAKE 1 TABLET BY MOUTH ONCE A DAY, Disp: 90 tablet, Rfl: 0   venlafaxine (EFFEXOR) 37.5 MG tablet, Take 37.5 mg by mouth daily., Disp: , Rfl:    vitamin B-12 (CYANOCOBALAMIN) 500 MCG tablet, Take 1 tablet (500 mcg total) by mouth every morning., Disp: , Rfl:    vitamin E 180 MG (400 UNITS) capsule, Take 400 Units by mouth daily., Disp: , Rfl:    Medications ordered in this encounter:  Meds ordered this encounter  Medications   amoxicillin-clavulanate (AUGMENTIN) 875-125 MG tablet    Sig: Take 1 tablet by mouth 2 (two) times daily for 7 days.    Dispense:  14 tablet    Refill:  0    Order Specific Question:   Supervising Provider    Answer:   Merrilee Jansky [[id number]]   benzonatate (TESSALON) 200 MG capsule    Sig: Take 1 capsule (200 mg total) by mouth 2 (two) times daily as needed for cough.    Dispense:  20 capsule    Refill:  0    Order Specific Question:   Supervising Provider    Answer:   Merrilee Jansky [id number]     *If you need refills on other medications prior to your next appointment, please contact your pharmacy*  Follow-Up: Call back or seek an in-person evaluation if the symptoms worsen or if the condition fails to  improve as anticipated.  [facility] [phone]  Other Instructions   -Take meds as prescribed -Rest -Use a cool mist humidifier especially during the winter months when heat dries out the air. - Use saline nose sprays frequently to help soothe nasal passages and promote drainage. -Saline irrigations of the nose can be very helpful if done frequently.             * 4X daily for 1 week*             * Use of a nettie pot can be helpful with this.  *Follow directions with this* *Boiled or distilled water only -stay hydrated by drinking plenty of fluids - Keep thermostat turn down low to prevent drying out sinuses - For any cough or congestion- robitussin DM or Delsym as needed - For fever or  aches or pains- take tylenol or ibuprofen as directed on bottle             * for fevers greater than 101 orally you may alternate ibuprofen and tylenol every 3 hours.  If you do not improve you will need a follow up visit in person.                 If you have been instructed to have an in-person evaluation today at a local Urgent Care facility, please use the link below. It will take you to a list of all of our available [facility], including address, phone number and hours of operation. Please do not delay care.  [facility]  If you or a family member do not have a primary care provider, use the link below to schedule a visit and establish care. When you choose a [facility] primary care physician or advanced practice provider, you gain a long-term partner in health. Find a Primary Care Provider  Learn more about [facility] in-office and virtual care options: [facility] - Get Care Now

## 2022-11-09 ENCOUNTER — Telehealth (HOSPITAL_COMMUNITY): Payer: Self-pay

## 2022-11-09 NOTE — Telephone Encounter (Signed)
Patient advised and verbalized understanding 

## 2022-11-09 NOTE — Telephone Encounter (Signed)
Patient called wanting to know the results of her PET scan. Results below. Please advise.    Image Analysis:   Rest Metabolism Conclusion Basal Anterior Normal Normal Normal Basal Ant Septal Mild Normal See Below Basal Inf Septal Mild Normal See Below Basal Inferior Normal Normal Normal Basal Inf Lat Normal Normal Normal Basal Ant Lat Normal Normal Normal Mid Anterior Normal Normal Normal Mid Ant Septal Normal Normal Normal Mid Inf Septal Normal Normal Normal Mid Inferior Normal Normal Normal Mid Inf Lateral Normal Normal Normal Mid Ant Lateral Normal Normal Normal Apical Anterior Normal Normal Normal Apical Septal Normal Normal Normal Apical Inferior Normal Normal Normal Apical Lateral Normal Normal Normal Apex Normal Normal Normal PET RESULTS Technical Quality: Excellent  REST Gated Cardiac PET/CT Rest Myocardial Perfusion Study Demonstrates Abnormal Myocardial PERFUSION: Perfusion, Similar to Prior.   IMAGE PROTOCOL PET Sarcoid Modality: PET- Discovery MI Radiopharmaceutical Dose (mCi) Imaging  Date Inj Time Img Time Rest: RB-82 Rubidium, IV 30 11/02/2022 1124 1124 Metabolism: F18 - 15.1 11/02/2022 1142 1323 Fluorodeoxyglucose, IV  Rest Administration Site: Left antecubital fossa Tech Administering Rest Dose: Inocencio Homes, CNMT FDG Administration Site: Left antecubital fossa Tech Administering Stress Dose: Metallurgist, CNMT FUNCTIONAL RESULTS (calculated via Gated SPECT)   Rest Image LV EF: 56 Rest Image LV EF: % Rest EDV: 94 ml EDVI: 48 ml/m Rest ESV: 41 ml ESVI: 21 ml/m        Wall Motion   Wall Thickening Anterior: Normal Normal Apex: Normal Normal Inferior: Normal Normal Lateral: Normal Normal Septal: Normal Normal  LV Ejection Fraction  & Size: Gated Cardiac PET/CT Functional Study Demonstrates Normal Left Ventricular Ejection Fraction at Rest. Normal Left Ventricular Volumes.   LV Function & Wall Thickening: Gated Cardiac PET/CT Rest  Myocardial Perfusion Study Demonstrates Normal Left Ventricular Systolic Function Right Ventricular Analysis: There is No Evidence of Increased Uptake of the Perfusion Radiotracer in the Right Ventricle to Suggest RVH and/or Elevated RV Pressure. There is No Evidence of Increased FDG Uptake in the Right Ventricle to Suggest an Active Inflammatory Process.  FINAL COMMENTS   Metabolism: Cardiac PET/CT metabolic study with F-18 FDG 15 mCi and scanned on PET Discovery MI. There is no abnormal metabolism to indicate the presence of an active inflammatory process in the left ventricular myocardium. There is no abnormal metabolism in the right ventricle.  - There is no evidence to suggest active cardiac sarcoidosis.  - There are no areas of extracardiac hypermetabolic activity noted on the limited field of view.  Perfusion/Function: Gated cardiac PET/CT rest myocardial perfusion study with Rb-82 demonstrates: 1. Unchanged mild basal septal hypoperfusion with otherwise normal perfusion. 2. Normal left ventricular systolic function.  Non-diagnostic CT obtained for attenuation correction: 1. There are no discernible coronary artery calcifications. CRT-D noted. 2. Right upper lobe sub-centimeter nodule, stable since 01/29/21 CT.

## 2022-11-09 NOTE — Telephone Encounter (Signed)
See my note attached to the report.  There was no evidence for active inflammation in the heart due to sarcoidosis.  Good new.

## 2022-11-11 ENCOUNTER — Ambulatory Visit (HOSPITAL_COMMUNITY)
Admission: RE | Admit: 2022-11-11 | Discharge: 2022-11-11 | Disposition: A | Discharge status: Home / Self Care | Payer: BC Managed Care – PPO | Source: Ambulatory Visit | Attending: Cardiology | Admitting: Cardiology

## 2022-11-11 VITALS — BP 122/78 | HR 71 | Wt 171.1 lb

## 2022-11-11 DIAGNOSIS — Z79899 Other long term (current) drug therapy: Secondary | ICD-10-CM | POA: Diagnosis not present

## 2022-11-11 DIAGNOSIS — D8685 Sarcoid myocarditis: Secondary | ICD-10-CM | POA: Insufficient documentation

## 2022-11-11 DIAGNOSIS — I442 Atrioventricular block, complete: Secondary | ICD-10-CM | POA: Diagnosis not present

## 2022-11-11 DIAGNOSIS — I5022 Chronic systolic (congestive) heart failure: Secondary | ICD-10-CM | POA: Diagnosis not present

## 2022-11-11 LAB — COMPREHENSIVE METABOLIC PANEL
ALT: 14 U/L (ref 0–44)
AST: 20 U/L (ref 15–41)
Albumin: 3.5 g/dL (ref 3.5–5.0)
Alkaline Phosphatase: 51 U/L (ref 38–126)
Anion gap: 6 (ref 5–15)
BUN: 9 mg/dL (ref 6–20)
CO2: 28 mmol/L (ref 22–32)
Calcium: 8.9 mg/dL (ref 8.9–10.3)
Chloride: 105 mmol/L (ref 98–111)
Creatinine, Ser: 0.94 mg/dL (ref 0.44–1.00)
GFR, Estimated: >60 mL/min (ref >60)
Glucose, Bld: 98 mg/dL (ref 70–99)
Potassium: 4.1 mmol/L (ref 3.5–5.1)
Sodium: 139 mmol/L (ref 135–145)
Total Bilirubin: 0.6 mg/dL (ref 0.3–1.2)
Total Protein: 6 g/dL — ABNORMAL LOW (ref 6.5–8.1)

## 2022-11-11 NOTE — Patient Instructions (Addendum)
There has been no changes to your medications.  Labs done today, your results will be available in MyChart, we will contact you for abnormal readings.  Repeat blood work in 3 months at [facility].  Your physician recommends that you schedule a follow-up appointment in: 6 months ( November) at [facility]. **please call the office at [phone] in September to arrange your follow up appointment. **  If you have any questions or concerns before your next appointment please send [name] a message through [place] or call our office at [phone].    TO LEAVE A MESSAGE FOR THE NURSE SELECT OPTION 2, PLEASE LEAVE A MESSAGE INCLUDING: YOUR NAME DATE OF BIRTH CALL BACK NUMBER REASON FOR CALL**this is important as we prioritize the call backs  YOU WILL RECEIVE A CALL BACK THE SAME DAY AS LONG AS YOU CALL BEFORE 4:00 PM  At the [facility], you and your health needs are our priority. As part of our continuing mission to provide you with exceptional heart care, we have created designated Provider Care Teams. These Care Teams include your primary Cardiologist (physician) and Advanced Practice Providers (APPs- Physician Assistants and Nurse Practitioners) who all work together to provide you with the care you need, when you need it.   You may see any of the following providers on your designated Care Team at your next follow up: Dr Arvilla Meres Dr Marca Ancona Dr. Marcos Eke, NP Robbie Lis, [place] [facility] [place], [place] Brynda Peon, NP Karle Plumber, PharmD   Please be sure to bring in all your medications bottles to every appointment.    Thank you for choosing [facility]

## 2022-11-11 NOTE — Progress Notes (Signed)
PCP: Donita Brooks, MD EP: Dr. Graciela Husbands HF cardiology: Dr. Shirlee Latch  57 y.o. with history of complete heart block s/p St Jude BiV ICD placement and suspected cardiac sarcoidosis was referred by Dr. Graciela Husbands for management of cardiac sarcoidosis. Patient generally has been in good health, no major medical problems until 5/22.  In 5/22, she developed "dizzy spells."  She would check her pulse and find it to be in the 30s.  She did not pass out.  She had an ECG at her PCP's office showing complete heart block and was admitted.  Echo showed normal LV systolic function.  Cardiac MRI was done due to concern for possible cardiac sarcoidosis as cause of CHB in a relatively young woman.  Cardiac MRI showed a mid-wall septal LGE pattern with elevated septal T2 signal that was concerning for cardiac sarcoidosis.  Patient had implantation of St Jude CRT-D device.  She then had a cardiac PET at [facility] which showed inflammation in the septum and the RV free wall along with a septal perfusion defect.  She was seen by Dr. Graciela Husbands and was started on prednisone and methotrexate for treatment of high probability cardiac sarcoidosis.   CT chest in 9/22 did not show signs of pulmonary sarcoidosis.   Repeat cardiac PET in 3/23 showed EF 59%, several areas of hypermetabolic activity suggestive of active inflammatory process in the LV, similar to prior cPET.   Echo in 8/23 showed EF 55%, basal septal hypokinesis, mildly decreased RV systolic function.  Cardiac PET in 4/24 showed EF 56%, no abnormal metabolism (no evidence for active inflammation).   Patient returns for followup of cardiac sarcoidosis.  She is now on 20 mg MTX and is off prednisone.  She is doing well overall, no significant exertional dyspnea and no chest pain.  No orthopnea/PND. No palpitations or lightheadedness. Weight down 5 lbs.   St Jude device interrogation: >99% BiV pacing, stable thoracic impedance.   Labs (5/22): K 4, creatinine 0.98 Labs (11/22): K  4.7, creatinine 1.18, LFTs normal Labs (12/22): hgb 13.2, K 4.2, creatinine 1.2 Labs (2/23): LFTs normal, K 4, creatinine 1.08 Labs (7/23): K 3.9, creatinine 0.93, LFTs normal, hgb 12.3 Labs (8/23): ACE level 82 (upper normal), hgb 12.5, K 4.1, creatinine 0.92, LFTs normal Labs (1/24): LDL 135, HDL 55  PMH: 1. H/o Shingles 2. Ovarian cyst 3. Complete heart block: Thought to be due to cardiac sarcoidosis, s/p placement of St Jude CRT-D device.  4. Cardiac sarcoidosis: Strong suspicion.  Patient presented in 5/22 with complete heart block.  - Echo (5/22): EF 60-65%, normal RV, PASP 36 mmHg.  - Cardiac MRI (5/22): LV EF 64%, increased T2 signal in septum, mid-wall septal LGE (non-MI pattern), RV EF 61%.  - Cardiac PET (7/22): LV EF 57%, inflammation in 57% of the septal wall and 17% of the total LV, inflammation also involved the RV free wall.  There was a septal perfusion defect. This was concerning for possible cardiac sarcoidosis.  - High resolution CT chest (9/22): No signs of sarcoidosis, small RUL nodule.  - Cardiac PET (3/23): EF 59%, several areas of hypermetabolic activity suggestive of active inflammatory process in the LV, similar to prior cPET.  - Echo (8/23): EF 55%, basal septal hypokinesis, mildly decreased RV systolic function. - Cardiac PET (4/24): EF 56%, no abnormal metabolism (no evidence for active inflammation). 5. Lung nodule: following with serial CTs 6. Calcium score scan (2/24): calcium score 0   SH: Single, [place] from [place] originally,  now lives in [place], nonsmoker, no ETOH/drugs, working full time.   FH: No family history of sarcoidosis or cardiomyopathy.   ROS: All systems reviewed and negative except as per HPI.   Current Outpatient Medications  Medication Sig Dispense Refill   amoxicillin-clavulanate (AUGMENTIN) 875-125 MG tablet Take 1 tablet by mouth 2 (two) times daily for 7 days. 14 tablet 0   benzonatate (TESSALON) 200 MG capsule Take 1  capsule (200 mg total) by mouth 2 (two) times daily as needed for cough. 20 capsule 0   BIOTIN MAXIMUM PO Take 10,000 mcg by mouth daily.     Black Cohosh 540 MG CAPS Take 540 mg by mouth daily in the afternoon.     carvedilol (COREG) 3.125 MG tablet TAKE 1 TABLET BY MOUTH TWICE A DAY 180 tablet 3   Cholecalciferol (VITAMIN D3) 25 MCG (1000 UT) CAPS Take 1 capsule by mouth daily.     diphenhydrAMINE (BENADRYL) 25 mg capsule Take 25 mg by mouth every 4 (four) hours as needed for itching.     folic acid (FOLVITE) 1 MG tablet Take 2 mg by mouth daily.     furosemide (LASIX) 20 MG tablet TAKE 1 TABLET BY MOUTH ONCE A DAY 90 tablet 3   methotrexate (RHEUMATREX) 2.5 MG tablet Take 8 tablets (20 mg total) by mouth once a week. Caution:Chemotherapy. Protect from light. 94 tablet 3   Multiple Vitamins-Minerals (VITAMIN D3 COMPLETE PO) Take 1 tablet by mouth daily.     potassium chloride (KLOR-CON) 10 MEQ tablet TAKE 1 TABLET BY MOUTH ONCE A DAY 90 tablet 0   venlafaxine (EFFEXOR) 37.5 MG tablet Take 37.5 mg by mouth daily.     vitamin B-12 (CYANOCOBALAMIN) 500 MCG tablet Take 1 tablet (500 mcg total) by mouth every morning.     vitamin E 180 MG (400 UNITS) capsule Take 400 Units by mouth daily.     No current facility-administered medications for this encounter.   BP 122/78   Pulse 71   Wt 77.6 kg (171 lb 2 oz)   SpO2 99%   BMI 28.48 kg/m  General: NAD Neck: No JVD, no thyromegaly or thyroid nodule.  Lungs: Clear to auscultation bilaterally with normal respiratory effort. CV: Nondisplaced PMI.  Heart regular S1/S2, no S3/S4, no murmur.  No peripheral edema.  No carotid bruit.  Normal pedal pulses.  Abdomen: Soft, nontender, no hepatosplenomegaly, no distention.  Skin: Intact without lesions or rashes.  Neurologic: Alert and oriented x 3.  Psych: Normal affect. Extremities: No clubbing or cyanosis.  HEENT: Normal.   Assessment/Plan: 1. Complete heart block: In a young woman, there was  concern for cardiac sarcoidosis as the cause.  Workup with cMRI and cPET was highly concerning for cardiac sarcoidosis.  She now has a Secondary school teacher CRT-D device.   - Dr. Graciela Husbands follows CRT-D device, functioning normally.  2. Cardiac sarcoidosis: Strong suspicion for cardiac sarcoidosis in a relatively young woman with complete heart block and both cMRI and cPET that are consistent with cardiac sarcoidosis.  She does not have evidence for extracardiac sarcoidosis; 25% of cardiac sarcoidosis, however, is isolated to the heart.  No skin lesions and exam by eye doctor was unremarkable per her report.  No pulmonary symptoms, CT chest in 9/22 did not show signs of sarcoidosis.   We do not have a tissue diagnosis, but endomyocardial biopsy has a relatively low sensitivity in cardiac sarcoidosis.  Given high probability CS, we have treated her based on the data  that we have. She is now off prednisone and on goal dose MTX.  cPET in 3/23 showed EF 59%, several areas of hypermetabolic activity suggestive of active inflammatory process in the LV, similar to prior cPET. Echo in 8/23 showed EF 55%, basal septal hypokinesis (consistent with septal site for LGE on cMRI).  Most recent cPET in 4/24 showed significant improvement, EF 56%, no abnormal metabolism (no evidence for active inflammation).  No dyspnea or chest pain. She is not volume overloaded by exam or [place].  - She is seeing rheumatology (Dr Dierdre Forth).  - Continue MTX 20 mg weekly.  She is tolerating this well and most recent cPET showed no active inflammation.   - She will need q 3 month LFTs with MTX at this point (check today and again in 3 months).   - Continue Coreg 3.125 mg bid.   - Continue Lasix 20 mg daily with KCl 10 daily.  BMET today.  3. Pulmonary nodule: Stable nodule on 5/23 CT.  - Repeat CT chest w/o contrast in 2 yrs (5/25).   Followup in 6 months in [facility] CHF office, CMET 3 months.   Marca Ancona 11/11/2022

## 2022-11-17 ENCOUNTER — Other Ambulatory Visit (HOSPITAL_COMMUNITY): Payer: Self-pay | Admitting: Cardiology

## 2022-12-02 ENCOUNTER — Ambulatory Visit (INDEPENDENT_AMBULATORY_CARE_PROVIDER_SITE_OTHER): Payer: BC Managed Care – PPO

## 2022-12-02 DIAGNOSIS — I442 Atrioventricular block, complete: Secondary | ICD-10-CM

## 2022-12-02 LAB — CUP PACEART REMOTE DEVICE CHECK
Battery Remaining Longevity: 64 mo
Battery Remaining Percentage: 70 %
Battery Voltage: 2.99 V
Brady Statistic AP VP Percent: 28 %
Brady Statistic AP VS Percent: 1 %
Brady Statistic AS VP Percent: 72 %
Brady Statistic AS VS Percent: 1 %
Brady Statistic RA Percent Paced: 27 %
Date Time Interrogation Session: 20240521201750
HighPow Impedance: 64 Ohm
HighPow Impedance: 64 Ohm
Implantable Lead Connection Status: 753985
Implantable Lead Connection Status: 753985
Implantable Lead Connection Status: 753985
Implantable Lead Implant Date: 20220520
Implantable Lead Implant Date: 20220520
Implantable Lead Implant Date: 20220520
Implantable Lead Location: 753858
Implantable Lead Location: 753859
Implantable Lead Location: 753860
Implantable Lead Model: 3830
Implantable Pulse Generator Implant Date: 20220520
Lead Channel Impedance Value: 430 Ohm
Lead Channel Impedance Value: 450 Ohm
Lead Channel Impedance Value: 540 Ohm
Lead Channel Pacing Threshold Amplitude: 0.5 V
Lead Channel Pacing Threshold Amplitude: 1 V
Lead Channel Pacing Threshold Amplitude: 2.25 V
Lead Channel Pacing Threshold Pulse Width: 0.05 ms
Lead Channel Pacing Threshold Pulse Width: 0.5 ms
Lead Channel Pacing Threshold Pulse Width: 0.5 ms
Lead Channel Sensing Intrinsic Amplitude: 12 mV
Lead Channel Sensing Intrinsic Amplitude: 3.1 mV
Lead Channel Setting Pacing Amplitude: 0.25 V
Lead Channel Setting Pacing Amplitude: 1.5 V
Lead Channel Setting Pacing Amplitude: 2.5 V
Lead Channel Setting Pacing Pulse Width: 0.05 ms
Lead Channel Setting Pacing Pulse Width: 0.5 ms
Lead Channel Setting Sensing Sensitivity: 0.5 mV
Pulse Gen Serial Number: 9956643
Zone Setting Status: 755011

## 2022-12-05 ENCOUNTER — Telehealth: Payer: BC Managed Care – PPO | Admitting: Family

## 2022-12-05 DIAGNOSIS — J209 Acute bronchitis, unspecified: Secondary | ICD-10-CM | POA: Diagnosis not present

## 2022-12-05 MED ORDER — GUAIFENESIN ER 600 MG PO TB12
1200.0000 mg | ORAL_TABLET | Freq: Two times a day (BID) | ORAL | 1 refills | Status: DC
Start: 2022-12-05 — End: 2023-05-06

## 2022-12-05 MED ORDER — FLUTICASONE PROPIONATE 50 MCG/ACT NA SUSP: 2.0000 | Freq: Every day | NASAL | 6 refills | Status: DC

## 2022-12-05 MED ORDER — CETIRIZINE HCL 10 MG PO TABS: 10.0000 mg | ORAL_TABLET | Freq: Every day | ORAL | 1 refills | Status: DC

## 2022-12-05 MED ORDER — PREDNISONE 20 MG PO TABS
40.0000 mg | ORAL_TABLET | Freq: Every day | ORAL | 0 refills | Status: AC
Start: 2022-12-05 — End: 2022-12-10

## 2022-12-05 NOTE — Progress Notes (Signed)
Virtual Visit Consent   Sheryl Gonzalez, you are scheduled for a virtual visit with a [facility] provider today. Just as with appointments in the office, your consent must be obtained to participate. Your consent will be active for this visit and any virtual visit you may have with one of our providers in the next 365 days. If you have a MyChart account, a copy of this consent can be sent to you electronically.  As this is a virtual visit, video technology does not allow for your provider to perform a traditional examination. This may limit your provider's ability to fully assess your condition. If your provider identifies any concerns that need to be evaluated in person or the need to arrange testing (such as labs, EKG, etc.), we will make arrangements to do so. Although advances in technology are sophisticated, we cannot ensure that it will always work on either your end or our end. If the connection with a video visit is poor, the visit may have to be switched to a telephone visit. With either a video or telephone visit, we are not always able to ensure that we have a secure connection.  By engaging in this virtual visit, you consent to the provision of healthcare and authorize for your insurance to be billed (if applicable) for the services provided during this visit. Depending on your insurance coverage, you may receive a charge related to this service.  I need to obtain your verbal consent now. Are you willing to proceed with your visit today? Sheryl Gonzalez has provided verbal consent on 12/05/2022 for a virtual visit (video or telephone). Sheryl Rodney, FNP  Date: 12/05/2022 6:15 PM  Virtual Visit via Video Note   I, Sheryl Gonzalez, connected with  Sheryl Gonzalez  ([id number], 05-15-1966) on 12/05/22 at  6:00 PM EDT by a video-enabled telemedicine application and verified that I am speaking with the correct person using two identifiers.  Location: Patient: Virtual Visit Location  Patient: Home Provider: Virtual Visit Location Provider: Home Office   I discussed the limitations of evaluation and management by telemedicine and the availability of in person appointments. The patient expressed understanding and agreed to proceed.    History of Present Illness: Sheryl Gonzalez is a 57 y.o. who identifies as a female who was assigned female at birth, and is being seen today for cough and congestion that started two days ago. Did a home COVID test that was negative.   HPI: Cough This is a new problem. The current episode started in the past 7 days. The problem has been unchanged. The problem occurs every few minutes. The cough is Non-productive. Associated symptoms include a fever, myalgias and nasal congestion. Pertinent negatives include no chills, ear congestion, ear pain, headaches, sore throat, shortness of breath or wheezing. She has tried rest and OTC cough suppressant for the symptoms. The treatment provided mild relief.    Problems:  Patient Active Problem List   Diagnosis Date Noted   Symptomatic bradycardia 11/30/2020   Biventricular ICD (implantable cardioverter-defibrillator) in place s/p placement 11/29/20 11/30/2020   Sarcoidosis 11/30/2020   Complete heart block (HCC) 11/28/2020   Menopausal symptoms 07/16/2014   Ovarian mass, right 05/24/2014   Nipple discharge, bloody 08/22/2012   Allergic rhinitis 05/31/2012   DEPRESSION 05/30/2008   ACUTE SINUSITIS, UNSPECIFIED 05/30/2008    Allergies:  Allergies  Allergen Reactions   Chlorhexidine Gluconate Itching, Rash and Other (See Comments)    blisters   Medications:  Current Outpatient  Medications:    cetirizine (ZYRTEC ALLERGY) 10 MG tablet, Take 1 tablet (10 mg total) by mouth daily., Disp: 90 tablet, Rfl: 1   fluticasone (FLONASE) 50 MCG/ACT nasal spray, Place 2 sprays into both nostrils daily., Disp: 16 g, Rfl: 6   guaiFENesin (MUCINEX) 600 MG 12 hr tablet, Take 2 tablets (1,200 mg total) by mouth  2 (two) times daily., Disp: 60 tablet, Rfl: 1   predniSONE (DELTASONE) 20 MG tablet, Take 2 tablets (40 mg total) by mouth daily with breakfast for 5 days., Disp: 10 tablet, Rfl: 0   benzonatate (TESSALON) 200 MG capsule, Take 1 capsule (200 mg total) by mouth 2 (two) times daily as needed for cough., Disp: 20 capsule, Rfl: 0   BIOTIN MAXIMUM PO, Take 10,000 mcg by mouth daily., Disp: , Rfl:    Black Cohosh 540 MG CAPS, Take 540 mg by mouth daily in the afternoon., Disp: , Rfl:    carvedilol (COREG) 3.125 MG tablet, TAKE 1 TABLET BY MOUTH TWICE A DAY, Disp: 180 tablet, Rfl: 3   Cholecalciferol (VITAMIN D3) 25 MCG (1000 UT) CAPS, Take 1 capsule by mouth daily., Disp: , Rfl:    diphenhydrAMINE (BENADRYL) 25 mg capsule, Take 25 mg by mouth every 4 (four) hours as needed for itching., Disp: , Rfl:    folic acid (FOLVITE) 1 MG tablet, Take 2 mg by mouth daily., Disp: , Rfl:    furosemide (LASIX) 20 MG tablet, TAKE 1 TABLET BY MOUTH ONCE A DAY, Disp: 90 tablet, Rfl: 3   methotrexate (RHEUMATREX) 2.5 MG tablet, Take 8 tablets (20 mg total) by mouth once a week. Caution:Chemotherapy. Protect from light., Disp: 94 tablet, Rfl: 3   Multiple Vitamins-Minerals (VITAMIN D3 COMPLETE PO), Take 1 tablet by mouth daily., Disp: , Rfl:    potassium chloride (KLOR-CON) 10 MEQ tablet, TAKE ONE TABLET BY MOUTH ONCE A DAY, Disp: 90 tablet, Rfl: 3   venlafaxine (EFFEXOR) 37.5 MG tablet, Take 37.5 mg by mouth daily., Disp: , Rfl:    vitamin B-12 (CYANOCOBALAMIN) 500 MCG tablet, Take 1 tablet (500 mcg total) by mouth every morning., Disp: , Rfl:    vitamin E 180 MG (400 UNITS) capsule, Take 400 Units by mouth daily., Disp: , Rfl:   Observations/Objective: Patient is well-developed, well-nourished in no acute distress.  Resting comfortably  at home.  Head is normocephalic, atraumatic.  No labored breathing.  Speech is clear and coherent with logical content.  Patient is alert and oriented at baseline.  Nasal congestion    Assessment and Plan: 1. Acute bronchitis, unspecified organism - cetirizine (ZYRTEC ALLERGY) 10 MG tablet; Take 1 tablet (10 mg total) by mouth daily.  Dispense: 90 tablet; Refill: 1 - fluticasone (FLONASE) 50 MCG/ACT nasal spray; Place 2 sprays into both nostrils daily.  Dispense: 16 g; Refill: 6 - guaiFENesin (MUCINEX) 600 MG 12 hr tablet; Take 2 tablets (1,200 mg total) by mouth 2 (two) times daily.  Dispense: 60 tablet; Refill: 1 - predniSONE (DELTASONE) 20 MG tablet; Take 2 tablets (40 mg total) by mouth daily with breakfast for 5 days.  Dispense: 10 tablet; Refill: 0  - Take meds as prescribed - Use a cool mist humidifier  -Use saline nose sprays frequently -Force fluids -For any cough or congestion  Use plain Mucinex- regular strength or max strength is fine -For fever or aces or pains- take tylenol or ibuprofen. -Throat lozenges if help -Follow up if symptoms worsen or do not improve   Follow Up Instructions:  I discussed the assessment and treatment plan with the patient. The patient was provided an opportunity to ask questions and all were answered. The patient agreed with the plan and demonstrated an understanding of the instructions.  A copy of instructions were sent to the patient via MyChart unless otherwise noted below.    The patient was advised to call back or seek an in-person evaluation if the symptoms worsen or if the condition fails to improve as anticipated.  Time:  I spent 12 minutes with the patient via telehealth technology discussing the above problems/concerns.    Sheryl Rodney, FNP

## 2022-12-15 ENCOUNTER — Other Ambulatory Visit
Admission: RE | Admit: 2022-12-15 | Discharge: 2022-12-15 | Disposition: A | Discharge status: Home / Self Care | Payer: BC Managed Care – PPO | Source: Ambulatory Visit | Attending: Internal Medicine | Admitting: Internal Medicine

## 2022-12-15 DIAGNOSIS — Z79899 Other long term (current) drug therapy: Secondary | ICD-10-CM | POA: Insufficient documentation

## 2022-12-15 LAB — CBC WITH DIFFERENTIAL/PLATELET
Abs Immature Granulocytes: 0.27 10*3/uL — ABNORMAL HIGH (ref 0.00–0.07)
Basophils Absolute: 0.1 10*3/uL (ref 0.0–0.1)
Basophils Relative: 1 %
Eosinophils Absolute: 0.4 10*3/uL (ref 0.0–0.5)
Eosinophils Relative: 6 %
HCT: 39.6 % (ref 36.0–46.0)
Hemoglobin: 12.5 g/dL (ref 12.0–15.0)
Immature Granulocytes: 4 %
Lymphocytes Relative: 44 %
Lymphs Abs: 2.7 10*3/uL (ref 0.7–4.0)
MCH: 27.1 pg (ref 26.0–34.0)
MCHC: 31.6 g/dL (ref 30.0–36.0)
MCV: 85.7 fL (ref 80.0–100.0)
Monocytes Absolute: 0.7 10*3/uL (ref 0.1–1.0)
Monocytes Relative: 11 %
Neutro Abs: 2.1 10*3/uL (ref 1.7–7.7)
Neutrophils Relative %: 34 %
Platelets: 290 10*3/uL (ref 150–400)
RBC: 4.62 MIL/uL (ref 3.87–5.11)
RDW: 14.5 % (ref 11.5–15.5)
WBC: 6.2 10*3/uL (ref 4.0–10.5)
nRBC: 0 % (ref 0.0–0.2)

## 2022-12-15 LAB — COMPREHENSIVE METABOLIC PANEL
ALT: 20 U/L (ref 0–44)
AST: 21 U/L (ref 15–41)
Albumin: 3.9 g/dL (ref 3.5–5.0)
Alkaline Phosphatase: 43 U/L (ref 38–126)
Anion gap: 9 (ref 5–15)
BUN: 18 mg/dL (ref 6–20)
CO2: 29 mmol/L (ref 22–32)
Calcium: 8.8 mg/dL — ABNORMAL LOW (ref 8.9–10.3)
Chloride: 100 mmol/L (ref 98–111)
Creatinine, Ser: 1.16 mg/dL — ABNORMAL HIGH (ref 0.44–1.00)
GFR, Estimated: 54 mL/min — ABNORMAL LOW (ref >60)
Glucose, Bld: 100 mg/dL — ABNORMAL HIGH (ref 70–99)
Potassium: 4.4 mmol/L (ref 3.5–5.1)
Sodium: 138 mmol/L (ref 135–145)
Total Bilirubin: 0.7 mg/dL (ref 0.3–1.2)
Total Protein: 6.4 g/dL — ABNORMAL LOW (ref 6.5–8.1)

## 2022-12-24 DIAGNOSIS — Z124 Encounter for screening for malignant neoplasm of cervix: Secondary | ICD-10-CM | POA: Diagnosis not present

## 2022-12-24 DIAGNOSIS — Z6827 Body mass index (BMI) 27.0-27.9, adult: Secondary | ICD-10-CM | POA: Diagnosis not present

## 2022-12-24 DIAGNOSIS — Z01419 Encounter for gynecological examination (general) (routine) without abnormal findings: Secondary | ICD-10-CM | POA: Diagnosis not present

## 2022-12-24 DIAGNOSIS — Z13820 Encounter for screening for osteoporosis: Secondary | ICD-10-CM | POA: Diagnosis not present

## 2022-12-24 DIAGNOSIS — Z1151 Encounter for screening for human papillomavirus (HPV): Secondary | ICD-10-CM | POA: Diagnosis not present

## 2022-12-24 DIAGNOSIS — Z1231 Encounter for screening mammogram for malignant neoplasm of breast: Secondary | ICD-10-CM | POA: Diagnosis not present

## 2022-12-24 LAB — HM MAMMOGRAPHY

## 2022-12-24 NOTE — Progress Notes (Signed)
Remote ICD transmission.   

## 2022-12-25 LAB — HM PAP SMEAR: HPV, high-risk: NEGATIVE

## 2023-01-05 DIAGNOSIS — Z1321 Encounter for screening for nutritional disorder: Secondary | ICD-10-CM | POA: Diagnosis not present

## 2023-01-05 DIAGNOSIS — Z1329 Encounter for screening for other suspected endocrine disorder: Secondary | ICD-10-CM | POA: Diagnosis not present

## 2023-01-06 LAB — LAB REPORT - SCANNED: TSH: 3.4 (ref 0.41–5.90)

## 2023-03-03 ENCOUNTER — Ambulatory Visit (INDEPENDENT_AMBULATORY_CARE_PROVIDER_SITE_OTHER): Payer: BC Managed Care – PPO

## 2023-03-03 DIAGNOSIS — I442 Atrioventricular block, complete: Secondary | ICD-10-CM

## 2023-03-03 LAB — CUP PACEART REMOTE DEVICE CHECK
Battery Remaining Longevity: 62 mo
Battery Remaining Percentage: 68 %
Battery Voltage: 2.98 V
Brady Statistic AP VP Percent: 26 %
Brady Statistic AP VS Percent: 1 %
Brady Statistic AS VP Percent: 73 %
Brady Statistic AS VS Percent: 1 %
Brady Statistic RA Percent Paced: 26 %
Date Time Interrogation Session: 20240821020246
HighPow Impedance: 68 Ohm
HighPow Impedance: 68 Ohm
Implantable Lead Connection Status: 753985
Implantable Lead Connection Status: 753985
Implantable Lead Connection Status: 753985
Implantable Lead Implant Date: 20220520
Implantable Lead Implant Date: 20220520
Implantable Lead Implant Date: 20220520
Implantable Lead Location: 753858
Implantable Lead Location: 753859
Implantable Lead Location: 753860
Implantable Lead Model: 3830
Implantable Pulse Generator Implant Date: 20220520
Lead Channel Impedance Value: 400 Ohm
Lead Channel Impedance Value: 450 Ohm
Lead Channel Impedance Value: 540 Ohm
Lead Channel Pacing Threshold Amplitude: 0.5 V
Lead Channel Pacing Threshold Amplitude: 1 V
Lead Channel Pacing Threshold Amplitude: 2.25 V
Lead Channel Pacing Threshold Pulse Width: 0.05 ms
Lead Channel Pacing Threshold Pulse Width: 0.5 ms
Lead Channel Pacing Threshold Pulse Width: 0.5 ms
Lead Channel Sensing Intrinsic Amplitude: 12 mV
Lead Channel Sensing Intrinsic Amplitude: 4 mV
Lead Channel Setting Pacing Amplitude: 0.25 V
Lead Channel Setting Pacing Amplitude: 1.5 V
Lead Channel Setting Pacing Amplitude: 2.5 V
Lead Channel Setting Pacing Pulse Width: 0.05 ms
Lead Channel Setting Pacing Pulse Width: 0.5 ms
Lead Channel Setting Sensing Sensitivity: 0.5 mV
Pulse Gen Serial Number: 9956643
Zone Setting Status: 755011

## 2023-03-17 DIAGNOSIS — Z79899 Other long term (current) drug therapy: Secondary | ICD-10-CM | POA: Diagnosis not present

## 2023-03-17 DIAGNOSIS — D8685 Sarcoid myocarditis: Secondary | ICD-10-CM | POA: Diagnosis not present

## 2023-03-17 DIAGNOSIS — Z7952 Long term (current) use of systemic steroids: Secondary | ICD-10-CM | POA: Diagnosis not present

## 2023-03-17 NOTE — Progress Notes (Signed)
Remote ICD transmission.   

## 2023-05-05 NOTE — Progress Notes (Unsigned)
Cardiology Office Note Date:  05/06/2023  Patient ID:  Sheryl Gonzalez [birth year], MRN [id number] PCP:  Donita Brooks, MD  Cardiologist:  Marca Ancona, MD Electrophysiologist: Sherryl Manges, MD    Chief Complaint: 1 year device follow-up  History of Present Illness: Sheryl Gonzalez is a 57 y.o. female with PMH notable for sarcoid, CHB s/p CRT-D; seen today for Sherryl Manges, MD for routine electrophysiology followup.  Last saw Dr. Graciela Husbands 04/2022, doing well. Off steroid, continued on methotrexate. No arrhythmias by device.   On follow-up today, patient is feeling very well. She continues to follow-up with Dr. Shirlee Latch and rheumatologist every 6 months. Continues to take methotrexate, off steroids. She had one episode recently of nausea for ~3 days. No emesis, no sick contacts. She took zofran and OTC dramamine and the episode resolved. No other symptoms during nausea episode. She does not regularly exercise. No chest pain, palpitations, SOB with grocery shopping or cleaning home.  She had craft fair at [place], [place] and was a little winded walking around and setting up, but likely due to hills.  Denies edema.  Device Information: St. Jude CRT-D, imp 11/2020; dx CHB   Past Medical History:  Diagnosis Date   Anxiety    Biventricular ICD (implantable cardioverter-defibrillator) in place s/p placement 11/29/20 11/30/2020   Colon polyps    Iron deficiency anemia due to chronic blood loss    Menorrhagia    Sarcoidosis 11/30/2020   Seasonal allergies    Symptomatic bradycardia 11/30/2020   Wears glasses     Past Surgical History:  Procedure Laterality Date   BIV ICD INSERTION CRT-D N/A 11/29/2020   Procedure: BIV ICD INSERTION CRT-D;  Surgeon: Duke Salvia, MD;  Location: [facility] INVASIVE CV LAB;  Service: Cardiovascular;  Laterality: N/A;   BREAST BIOPSY  2009   lt   BREAST BIOPSY Left 10/12/2012   Procedure: LEFT NEEDLE LOCALIZATION EXCISIONAL BREAST BIOPSY;  Surgeon:  Almond Lint, MD;  Location: [facility];  Service: General;  Laterality: Left;   BREAST DUCTAL SYSTEM EXCISION Left 10/12/2012   Procedure: MAJOR DUCTAL EXCISION;  Surgeon: Almond Lint, MD;  Location: [facility];  Service: General;  Laterality: Left;   COLONOSCOPY     ROBOTIC ASSISTED BILATERAL SALPINGO OOPHERECTOMY Bilateral 06/26/2014   Procedure: ROBOTIC ASSISTED BILATERAL SALPINGECTOMY, RIGHT OOPHORECTOMY;  Surgeon: Laurette Schimke, MD;  Location: [facility];  Service: Gynecology;  Laterality: Bilateral;    Current Outpatient Medications  Medication Instructions   BIOTIN MAXIMUM PO 10,000 mcg, Oral, Daily   carvedilol (COREG) 3.125 mg, Oral, 2 times daily   cetirizine (ZYRTEC ALLERGY) 10 mg, Oral, Daily   Cholecalciferol (VITAMIN D3) 25 MCG (1000 UT) CAPS 1 capsule, Oral, Daily   cyanocobalamin (VITAMIN B12) 500 mcg, Oral, Every morning   diphenhydrAMINE (BENADRYL) 25 mg, Oral, Every 4 hours PRN   fluticasone (FLONASE) 50 MCG/ACT nasal spray 2 sprays, Each Nare, Daily   folic acid (FOLVITE) 2 mg, Oral, Daily   furosemide (LASIX) 20 mg, Oral, Daily   methotrexate (RHEUMATREX) 20 mg, Oral, Weekly, Caution:Chemotherapy. Protect from light.   Multiple Vitamins-Minerals (VITAMIN D3 COMPLETE PO) 1 tablet, Oral, Daily   potassium chloride (KLOR-CON) 10 MEQ tablet 10 mEq, Oral, Daily   venlafaxine (EFFEXOR) 37.5 mg, Oral, Daily   vitamin E 400 Units, Oral, Daily    Social History:  The patient  reports that she has never smoked. She has never used smokeless tobacco. She reports that she does not  drink alcohol and does not use drugs.   Family History:  The patient's family history includes Arthritis in her mother, sister, and another family member; Cancer in her mother; Diabetes in her mother, sister, and another family member; Heart disease in her maternal uncle; Kidney disease in her cousin, maternal uncle, sister, and another family member; Uterine cancer in an other  family member.  ROS:  Please see the history of present illness. All other systems are reviewed and otherwise negative.   PHYSICAL EXAM:  VS:  BP 112/84 (BP Location: Left Arm, Patient Position: Sitting, Cuff Size: Normal)   Pulse 62   Ht 5\' 5"  (1.651 m)   Wt 169 lb 3.2 oz (76.7 kg)   SpO2 98%   BMI 28.16 kg/m  BMI: Body mass index is 28.16 kg/m.  GEN- The patient is well appearing, alert and oriented x 3 today.   Lungs- Clear to ausculation bilaterally, normal work of breathing.  Heart- Regular rate and rhythm, no murmurs, rubs or gallops Extremities- Trace peripheral edema, warm, dry Skin-  device pocket well-healed, no tethering   Device interrogation done today and reviewed by myself:  Battery 5 years Lead thresholds, impedence, sensing stable  RV lead is sub-threshold VP 99% No episodes CorVue stable No changes made today  EKG is ordered. Personal review of EKG from today shows:    EKG Interpretation Date/Time:  Thursday May 06 2023 09:38:10 EDT Ventricular Rate:  62 PR Interval:  168 QRS Duration:  126 QT Interval:  400 QTC Calculation: 406 R Axis:   -35  Text Interpretation: AV dual-paced rhythm with frequent ventricular-paced complexes Confirmed by Sherie Don [phone]) on 05/06/2023 9:39:42 AM    Recent Labs: 12/15/2022: ALT 20; BUN 18; Creatinine, Ser 1.16; Hemoglobin 12.5; Platelets 290; Potassium 4.4; Sodium 138  07/28/2022: Cholesterol 205; HDL 55; LDL Cholesterol 135; Total CHOL/HDL Ratio 3.7; Triglycerides 73; VLDL 15   CrCl cannot be calculated (Patient's most recent lab result is older than the maximum 21 days allowed.).   Wt Readings from Last 3 Encounters:  05/06/23 169 lb 3.2 oz (76.7 kg)  11/11/22 171 lb 2 oz (77.6 kg)  09/02/22 173 lb (78.5 kg)     Additional studies reviewed include: Previous EP, cardiology notes.   Cardiac CT, 08/14/2022 1. Normal coronary calcium score of 0.  Patient is low risk.  2. CAC 0, CAC-DRS A0.  3. Continue  heart healthy lifestyle and risk factor modification.  TTE, 02/24/2022  1. Left ventricular ejection fraction, by estimation, is 55%. The left ventricle has normal function. The left ventricle demonstrates regional wall motion abnormalities with basal septal severe hypokinesis. Left ventricular diastolic parameters are consistent with Grade I diastolic dysfunction (impaired relaxation).   2. Right ventricular systolic function is mildly reduced. The right ventricular size is normal. There is normal pulmonary artery systolic pressure. The estimated right ventricular systolic pressure is 20.6 mmHg.   3. The mitral valve is normal in structure. Trivial mitral valve regurgitation. No evidence of mitral stenosis.   4. The aortic valve is tricuspid. There is mild calcification of the aortic valve. Aortic valve regurgitation is not visualized. No aortic stenosis is present.   5. The inferior vena cava is normal in size with greater than 50% respiratory variability, suggesting right atrial pressure of 3 mmHg.   Pet myocardial, 01/29/2021 Metabolism   1- Cardiac PET/CT Metabolic Study with F-18 FDG 14.9 mCi given IV and Scanned on PET Discovery MI: There are segments with abnormal metabolism  to indicate presence of inflammation/active inflammatory process in the LV myocardium. This involve about 53% of the septal wall and 17% of the LV.  There is also moderate increased F-18 FDG in the RV free wall consistent with active inflammatory process. Findings may represent Sarcoidosis or AIC.   Perfusion/Function   1- Gated Cardiac PET/CT Rest Myocardial Perfusion Study with Rb-82 Reveals Abnormal Perfusion to The Myocardium Involving About 10% of The Interventricular Septum. All Segments Demonstrate Hypermetabolism And Therefore Should Not Be Consider Scar.   2- Normal Left Ventricular Ejection Fraction at Rest as Well as Left Ventricular Volumes. Normal Left Ventricular Systolic Function, Abnormal Wall Thickening and  Wall Motion.   3- No Evidence of Increased Tracer Uptake Rb-82 in The Right Ventricular Free Wall to Suggest RVH.    2- Cardiac CT Viewer: No Evidence of Coronary Artery Calcifications.   3- Limited View of Lower Chest and Upper Abdomen PET/CT: No Evidence of Extra Cardiac Areas in The Chest of Hypermetabolism to Suggest Extra Cardiac Sarcoidosis/Reactive Lymphnodes on The Limited [facility].   Cardiac MRI, 11/29/2020 Normal LV and RV function.  Atypical pattern of LGE: This could be consistent with cardiac sarcoidosis. Discussed with EP team.    ASSESSMENT AND PLAN:  #) CHB #) cardiac sarcoidosis #) s/p St. Jude CRT-D Device functioning well, see paceart for details 99% VP CorVue stable Continue q21m follow up with Adv HF and Rheumatology        Current medicines are reviewed at length with the patient today.   The patient does not have concerns regarding her medicines.  The following changes were made today:  none  Labs/ tests ordered today include:  Orders Placed This Encounter  Procedures   EKG 12-Lead     Disposition: Follow up with Dr. Graciela Husbands or EP APP in 12 months   Signed, Sherie Don, NP  05/06/23  10:09 AM  Electrophysiology [facility]

## 2023-05-06 ENCOUNTER — Ambulatory Visit: Payer: BC Managed Care – PPO | Attending: Cardiology | Admitting: Cardiology

## 2023-05-06 ENCOUNTER — Encounter: Payer: Self-pay | Admitting: Cardiology

## 2023-05-06 VITALS — BP 112/84 | HR 62 | Ht 65.0 in | Wt 169.2 lb

## 2023-05-06 DIAGNOSIS — Z95810 Presence of automatic (implantable) cardiac defibrillator: Secondary | ICD-10-CM

## 2023-05-06 DIAGNOSIS — I442 Atrioventricular block, complete: Secondary | ICD-10-CM

## 2023-05-06 DIAGNOSIS — D869 Sarcoidosis, unspecified: Secondary | ICD-10-CM | POA: Diagnosis not present

## 2023-05-06 LAB — CUP PACEART INCLINIC DEVICE CHECK
Date Time Interrogation Session: 20241024124505
Implantable Lead Connection Status: 753985
Implantable Lead Connection Status: 753985
Implantable Lead Connection Status: 753985
Implantable Lead Implant Date: 20220520
Implantable Lead Implant Date: 20220520
Implantable Lead Implant Date: 20220520
Implantable Lead Location: 753858
Implantable Lead Location: 753859
Implantable Lead Location: 753860
Implantable Lead Model: 3830
Implantable Pulse Generator Implant Date: 20220520
Pulse Gen Serial Number: 9956643

## 2023-05-06 NOTE — Patient Instructions (Signed)
Medication Instructions:  - Your physician recommends that you continue on your current medications as directed. Please refer to the Current Medication list given to you today.  *If you need a refill on your cardiac medications before your next appointment, please call your pharmacy*   Lab Work: - none ordered  If you have labs (blood work) drawn today and your tests are completely normal, you will receive your results only by: [place] (if you have MyChart) OR A paper copy in the mail If you have any lab test that is abnormal or we need to change your treatment, we will call you to review the results.   Testing/Procedures: - none ordered   Follow-Up: At [facility], you and your health needs are our priority.  As part of our continuing mission to provide you with exceptional heart care, we have created designated Provider Care Teams.  These Care Teams include your primary Cardiologist (physician) and Advanced Practice Providers (APPs -  Physician Assistants and Nurse Practitioners) who all work together to provide you with the care you need, when you need it.  We recommend signing up for the patient portal called "MyChart".  Sign up information is provided on this After Visit Summary.  MyChart is used to connect with patients for Virtual Visits (Telemedicine).  Patients are able to view lab/test results, encounter notes, upcoming appointments, etc.  Non-urgent messages can be sent to your provider as well.   To learn more about what you can do with MyChart, go to [email or web address].    Your next appointment:   9 month(s)  Provider:   Virl Axe, MD    Other Instructions N/a

## 2023-05-11 ENCOUNTER — Ambulatory Visit: Payer: BC Managed Care – PPO | Admitting: Family Medicine

## 2023-05-11 VITALS — BP 108/80 | HR 71 | Temp 99.9°F | Ht 65.0 in | Wt 169.0 lb

## 2023-05-11 DIAGNOSIS — J069 Acute upper respiratory infection, unspecified: Secondary | ICD-10-CM | POA: Diagnosis not present

## 2023-05-11 MED ORDER — PROMETHAZINE-DM 6.25-15 MG/5ML PO SYRP: 2.5000 mL | ORAL_SOLUTION | Freq: Four times a day (QID) | ORAL | 0 refills | Status: DC

## 2023-05-11 NOTE — Assessment & Plan Note (Signed)
Declines viral testing. Reassured patient that symptoms and exam findings are most consistent with a viral upper respiratory infection and explained lack of efficacy of antibiotics against viruses.  Discussed expected course and features suggestive of secondary bacterial infection.  Continue supportive care. Increase fluid intake with water or electrolyte solution like pedialyte. Encouraged acetaminophen as needed for fever/pain. Encouraged salt water gargling, chloraseptic spray and throat lozenges. Encouraged OTC guaifenesin. Encouraged saline sinus flushes and/or neti with humidified air.

## 2023-05-11 NOTE — Patient Instructions (Signed)
Your symptoms and exam findings are most consistent with a viral upper respiratory infection. These usually run their course in 5-7 days. Unfortunately, antibiotics don't work against viruses and just increase your risk of other issues such as diarrhea, yeast infections, and resistant infections.  If your symptoms last longer than 10 days and/or you start feeling worse with facial pain, high fever, cough, shortness of breath or start feeling significantly worse, please call us right away to be further evaluated.  Some things that can make you feel better are: - Increased rest - Increasing fluid with water/sugar free electrolytes - Acetaminophen as needed for fever/pain.  - Salt water gargling, chloraseptic spray and throat lozenges - OTC guaifenesin (Mucinex).  - Saline sinus flushes or a neti pot.  - Humidifying the air.  

## 2023-05-11 NOTE — Addendum Note (Signed)
Addended by: Arta Silence on: 05/11/2023 04:55 PM   Modules accepted: Orders

## 2023-05-11 NOTE — Progress Notes (Signed)
Subjective:  HPI: Sheryl Gonzalez is a 57 y.o. female presenting on 05/11/2023 for Follow-up (Really congested/loose stools)   HPI Patient is in today for  6 days of cough, congestion, diarrhea since yesterday. Denies fever, chills, body aches, sore throat, shortness of breath, wheezing, pleurisy, blood in stool, abdominal pain, sinus congestion. No known sick exposures Has tried Delsym Symptoms overall improving.   Review of Systems  All other systems reviewed and are negative.   Relevant past medical history reviewed and updated as indicated.   Past Medical History:  Diagnosis Date   Anxiety    Biventricular ICD (implantable cardioverter-defibrillator) in place s/p placement 11/29/20 11/30/2020   Colon polyps    Iron deficiency anemia due to chronic blood loss    Menorrhagia    Sarcoidosis 11/30/2020   Seasonal allergies    Symptomatic bradycardia 11/30/2020   Wears glasses      Past Surgical History:  Procedure Laterality Date   BIV ICD INSERTION CRT-D N/A 11/29/2020   Procedure: BIV ICD INSERTION CRT-D;  Surgeon: Duke Salvia, MD;  Location: [facility] INVASIVE CV LAB;  Service: Cardiovascular;  Laterality: N/A;   BREAST BIOPSY  2009   lt   BREAST BIOPSY Left 10/12/2012   Procedure: LEFT NEEDLE LOCALIZATION EXCISIONAL BREAST BIOPSY;  Surgeon: Almond Lint, MD;  Location: [facility];  Service: General;  Laterality: Left;   BREAST DUCTAL SYSTEM EXCISION Left 10/12/2012   Procedure: MAJOR DUCTAL EXCISION;  Surgeon: Almond Lint, MD;  Location: [facility];  Service: General;  Laterality: Left;   COLONOSCOPY     ROBOTIC ASSISTED BILATERAL SALPINGO OOPHERECTOMY Bilateral 06/26/2014   Procedure: ROBOTIC ASSISTED BILATERAL SALPINGECTOMY, RIGHT OOPHORECTOMY;  Surgeon: Laurette Schimke, MD;  Location: [facility];  Service: Gynecology;  Laterality: Bilateral;    Allergies and medications reviewed and updated.   Current Outpatient Medications:    BIOTIN  MAXIMUM PO, Take 10,000 mcg by mouth daily., Disp: , Rfl:    carvedilol (COREG) 3.125 MG tablet, TAKE 1 TABLET BY MOUTH TWICE A DAY, Disp: 180 tablet, Rfl: 3   Cholecalciferol (VITAMIN D3) 25 MCG (1000 UT) CAPS, Take 1 capsule by mouth daily., Disp: , Rfl:    folic acid (FOLVITE) 1 MG tablet, Take 2 mg by mouth daily., Disp: , Rfl:    furosemide (LASIX) 20 MG tablet, TAKE 1 TABLET BY MOUTH ONCE A DAY, Disp: 90 tablet, Rfl: 3   methotrexate (RHEUMATREX) 2.5 MG tablet, Take 8 tablets (20 mg total) by mouth once a week. Caution:Chemotherapy. Protect from light., Disp: 94 tablet, Rfl: 3   Multiple Vitamins-Minerals (VITAMIN D3 COMPLETE PO), Take 1 tablet by mouth daily., Disp: , Rfl:    potassium chloride (KLOR-CON) 10 MEQ tablet, TAKE ONE TABLET BY MOUTH ONCE A DAY, Disp: 90 tablet, Rfl: 3   promethazine-dextromethorphan (PROMETHAZINE-DM) 6.25-15 MG/5ML syrup, Take 2.5 mLs by mouth every 6 (six) hours., Disp: 118 mL, Rfl: 0   venlafaxine (EFFEXOR) 37.5 MG tablet, Take 37.5 mg by mouth daily., Disp: , Rfl:    vitamin B-12 (CYANOCOBALAMIN) 500 MCG tablet, Take 1 tablet (500 mcg total) by mouth every morning., Disp: , Rfl:    vitamin E 180 MG (400 UNITS) capsule, Take 400 Units by mouth daily., Disp: , Rfl:    cetirizine (ZYRTEC ALLERGY) 10 MG tablet, Take 1 tablet (10 mg total) by mouth daily. (Patient not taking: Reported on 05/11/2023), Disp: 90 tablet, Rfl: 1   diphenhydrAMINE (BENADRYL) 25 mg capsule, Take 25 mg by  mouth every 4 (four) hours as needed for itching. (Patient not taking: Reported on 05/11/2023), Disp: , Rfl:    fluticasone (FLONASE) 50 MCG/ACT nasal spray, Place 2 sprays into both nostrils daily. (Patient not taking: Reported on 05/11/2023), Disp: 16 g, Rfl: 6  Allergies  Allergen Reactions   Chlorhexidine Gluconate Itching, Rash and Other (See Comments)    blisters    Objective:   BP 108/80   Pulse 71   Temp 99.9 F (37.7 C) (Oral)   Ht 5\' 5"  (1.651 m)   Wt 169 lb (76.7 kg)    LMP  (Approximate)   SpO2 97%   BMI 28.12 kg/m      05/11/2023    3:04 PM 05/06/2023    9:25 AM 11/11/2022    8:23 AM  Vitals with BMI  Height 5\' 5"  5\' 5"    Weight 169 lbs 169 lbs 3 oz 171 lbs 2 oz  BMI 28.12 28.16   Systolic 108 112 409  Diastolic 80 84 78  Pulse 71 62 71     Physical Exam Vitals and nursing note reviewed.  Constitutional:      Appearance: Normal appearance. She is normal weight.  HENT:     Head: Normocephalic and atraumatic.     Right Ear: Tympanic membrane, ear canal and external ear normal.     Left Ear: Tympanic membrane, ear canal and external ear normal.     Nose: Nose normal.     Right Sinus: No maxillary sinus tenderness or frontal sinus tenderness.     Left Sinus: No maxillary sinus tenderness or frontal sinus tenderness.     Mouth/Throat:     Mouth: Mucous membranes are moist.     Pharynx: Oropharynx is clear.  Eyes:     Conjunctiva/sclera: Conjunctivae normal.     Pupils: Pupils are equal, round, and reactive to light.  Cardiovascular:     Rate and Rhythm: Normal rate and regular rhythm.     Pulses: Normal pulses.     Heart sounds: Normal heart sounds.  Pulmonary:     Effort: Pulmonary effort is normal.     Breath sounds: Normal breath sounds.  Abdominal:     General: Bowel sounds are normal.     Palpations: Abdomen is soft.  Musculoskeletal:     Cervical back: No tenderness.  Lymphadenopathy:     Cervical: No cervical adenopathy.  Skin:    General: Skin is warm and dry.  Neurological:     General: No focal deficit present.     Mental Status: She is alert and oriented to person, place, and time. Mental status is at baseline.  Psychiatric:        Mood and Affect: Mood normal.        Behavior: Behavior normal.        Thought Content: Thought content normal.        Judgment: Judgment normal.     Assessment & Plan:  Viral URI with cough Assessment & Plan: Declines viral testing. Reassured patient that symptoms and exam  findings are most consistent with a viral upper respiratory infection and explained lack of efficacy of antibiotics against viruses.  Discussed expected course and features suggestive of secondary bacterial infection.  Continue supportive care. Increase fluid intake with water or electrolyte solution like pedialyte. Encouraged acetaminophen as needed for fever/pain. Encouraged salt water gargling, chloraseptic spray and throat lozenges. Encouraged OTC guaifenesin. Encouraged saline sinus flushes and/or neti with humidified air.     Other  orders -     Promethazine-DM; Take 2.5 mLs by mouth every 6 (six) hours.  Dispense: 118 mL; Refill: 0     Follow up plan: Return if symptoms worsen or fail to improve.  Park Meo, FNP

## 2023-05-19 ENCOUNTER — Other Ambulatory Visit (HOSPITAL_COMMUNITY): Payer: Self-pay | Admitting: Cardiology

## 2023-05-26 ENCOUNTER — Encounter: Payer: Self-pay | Admitting: Family Medicine

## 2023-05-26 ENCOUNTER — Ambulatory Visit: Payer: BC Managed Care – PPO | Admitting: Family Medicine

## 2023-05-26 VITALS — BP 102/68 | HR 72 | Temp 98.8°F | Ht 65.0 in | Wt 166.0 lb

## 2023-05-26 DIAGNOSIS — F419 Anxiety disorder, unspecified: Secondary | ICD-10-CM | POA: Insufficient documentation

## 2023-05-26 DIAGNOSIS — R5383 Other fatigue: Secondary | ICD-10-CM

## 2023-05-26 DIAGNOSIS — N920 Excessive and frequent menstruation with regular cycle: Secondary | ICD-10-CM | POA: Insufficient documentation

## 2023-05-26 DIAGNOSIS — D649 Anemia, unspecified: Secondary | ICD-10-CM | POA: Insufficient documentation

## 2023-05-26 LAB — URINALYSIS, ROUTINE W REFLEX MICROSCOPIC
Bacteria, UA: NONE SEEN /[HPF]
Bilirubin Urine: NEGATIVE
Glucose, UA: NEGATIVE
Hyaline Cast: NONE SEEN /[LPF]
Ketones, ur: NEGATIVE
Leukocytes,Ua: NEGATIVE
Nitrite: NEGATIVE
Protein, ur: NEGATIVE
Specific Gravity, Urine: 1.021 (ref 1.001–1.035)
WBC, UA: NONE SEEN /[HPF] (ref 0–5)
pH: 5.5 (ref 5.0–8.0)

## 2023-05-26 LAB — MICROSCOPIC MESSAGE

## 2023-05-26 NOTE — Assessment & Plan Note (Signed)
Recent viral URI. EKG NSR with pacer. CBC, CMP, TSH done. UA negative. Encouraged to push fluids and nutrition and get adequate sleep. If no improvement in symptoms will need follow up in 1 week for further evaluation.

## 2023-05-26 NOTE — Progress Notes (Signed)
Subjective:  HPI: PA COLMENERO is a 57 y.o. female presenting on 05/26/2023 for Follow-up (Appetite not there/very tired/)   HPI Patient is in today for loss of appetite and fatigue for 5 days. She had a recent URI 2 weeks ago and continues to have mild congestion. She does have PMH of anemia. She is not sleeping at night due to polyuria. Denies nausea, vomiting, diarrhea, hematuria, hematochezia or melena, abdominal pain, bruising, lightheadedness, dizziness, palpitations, chest pain, SOB, fever, chills, body aches. She has not felt her defibrillator firing UTD colon cancer screening, mammogram, and PAP.   Review of Systems  All other systems reviewed and are negative.   Relevant past medical history reviewed and updated as indicated.   Past Medical History:  Diagnosis Date   Anxiety    Biventricular ICD (implantable cardioverter-defibrillator) in place s/p placement 11/29/20 11/30/2020   Colon polyps    Iron deficiency anemia due to chronic blood loss    Menorrhagia    Sarcoidosis 11/30/2020   Seasonal allergies    Symptomatic bradycardia 11/30/2020   Wears glasses      Past Surgical History:  Procedure Laterality Date   BIV ICD INSERTION CRT-D N/A 11/29/2020   Procedure: BIV ICD INSERTION CRT-D;  Surgeon: Duke Salvia, MD;  Location: [facility]IVE CV [facility];  Service: Cardiovascular;  Laterality: N/A;   BREAST BIOPSY  2009   lt   BREAST BIOPSY Left 10/12/2012   Procedure: LEFT NEEDLE LOCALIZATION EXCISIONAL BREAST BIOPSY;  Surgeon: Almond Lint, MD;  Location: [facility];  Service: General;  Laterality: Left;   BREAST DUCTAL SYSTEM EXCISION Left 10/12/2012   Procedure: MAJOR DUCTAL EXCISION;  Surgeon: Almond Lint, MD;  Location: [facility];  Service: General;  Laterality: Left;   COLONOSCOPY     ROBOTIC ASSISTED BILATERAL SALPINGO OOPHERECTOMY Bilateral 06/26/2014   Procedure: ROBOTIC ASSISTED BILATERAL SALPINGECTOMY, RIGHT OOPHORECTOMY;   Surgeon: Laurette Schimke, MD;  Location: [facility];  Service: Gynecology;  Laterality: Bilateral;    Allergies and medications reviewed and updated.   Current Outpatient Medications:    BIOTIN MAXIMUM PO, Take 10,000 mcg by mouth daily., Disp: , Rfl:    carvedilol (COREG) 3.125 MG tablet, TAKE ONE TABLET BY MOUTH TWICE A DAY, Disp: 180 tablet, Rfl: 3   Cholecalciferol (VITAMIN D3) 25 MCG (1000 UT) CAPS, Take 1 capsule by mouth daily., Disp: , Rfl:    folic acid (FOLVITE) 1 MG tablet, Take 2 mg by mouth daily., Disp: , Rfl:    furosemide (LASIX) 20 MG tablet, TAKE ONE TABLET BY MOUTH ONCE A DAY, Disp: 90 tablet, Rfl: 3   methotrexate (RHEUMATREX) 2.5 MG tablet, Take 8 tablets (20 mg total) by mouth once a week. Caution:Chemotherapy. Protect from light., Disp: 94 tablet, Rfl: 3   Multiple Vitamins-Minerals (VITAMIN D3 COMPLETE PO), Take 1 tablet by mouth daily., Disp: , Rfl:    potassium chloride (KLOR-CON) 10 MEQ tablet, TAKE ONE TABLET BY MOUTH ONCE A DAY, Disp: 90 tablet, Rfl: 3   promethazine-dextromethorphan (PROMETHAZINE-DM) 6.25-15 MG/5ML syrup, Take 2.5 mLs by mouth every 6 (six) hours., Disp: 118 mL, Rfl: 0   venlafaxine (EFFEXOR) 37.5 MG tablet, Take 37.5 mg by mouth daily., Disp: , Rfl:    vitamin B-12 (CYANOCOBALAMIN) 500 MCG tablet, Take 1 tablet (500 mcg total) by mouth every morning., Disp: , Rfl:    vitamin E 180 MG (400 UNITS) capsule, Take 400 Units by mouth daily., Disp: , Rfl:    cetirizine (ZYRTEC  ALLERGY) 10 MG tablet, Take 1 tablet (10 mg total) by mouth daily. (Patient not taking: Reported on 05/11/2023), Disp: 90 tablet, Rfl: 1   diphenhydrAMINE (BENADRYL) 25 mg capsule, Take 25 mg by mouth every 4 (four) hours as needed for itching. (Patient not taking: Reported on 05/11/2023), Disp: , Rfl:    fluticasone (FLONASE) 50 MCG/ACT nasal spray, Place 2 sprays into both nostrils daily. (Patient not taking: Reported on 05/11/2023), Disp: 16 g, Rfl: 6  Allergies  Allergen Reactions    Chlorhexidine Rash   Chlorhexidine Gluconate Itching, Rash and Other (See Comments)    blisters    Objective:   BP 102/68   Pulse 72   Temp 98.8 F (37.1 C) (Oral)   Ht 5\' 5"  (1.651 m)   Wt 166 lb (75.3 kg)   LMP  (Approximate)   SpO2 96%   BMI 27.62 kg/m      05/26/2023   11:12 AM 05/11/2023    3:04 PM 05/06/2023    9:25 AM  Vitals with BMI  Height 5\' 5"  5\' 5"  5\' 5"   Weight 166 lbs 169 lbs 169 lbs 3 oz  BMI 27.62 28.12 28.16  Systolic 102 108 469  Diastolic 68 80 84  Pulse 72 71 62     Physical Exam Vitals and nursing note reviewed.  Constitutional:      Appearance: Normal appearance. She is normal weight.  HENT:     Head: Normocephalic and atraumatic.     Right Ear: Tympanic membrane, ear canal and external ear normal.     Left Ear: Tympanic membrane, ear canal and external ear normal.     Nose: Nose normal.     Mouth/Throat:     Mouth: Mucous membranes are moist.     Pharynx: Oropharynx is clear.  Eyes:     Conjunctiva/sclera: Conjunctivae normal.  Cardiovascular:     Rate and Rhythm: Normal rate and regular rhythm.     Pulses: Normal pulses.     Heart sounds: Normal heart sounds.  Pulmonary:     Effort: Pulmonary effort is normal.     Breath sounds: Normal breath sounds.  Musculoskeletal:     Cervical back: Normal range of motion and neck supple.  Skin:    General: Skin is warm and dry.     Coloration: Skin is pale.  Neurological:     General: No focal deficit present.     Mental Status: She is alert and oriented to person, place, and time. Mental status is at baseline.  Psychiatric:        Mood and Affect: Mood normal.        Behavior: Behavior normal.        Thought Content: Thought content normal.        Judgment: Judgment normal.     Assessment & Plan:  Fatigue, unspecified type Assessment & Plan: Recent viral URI. EKG NSR with pacer. CBC, CMP, TSH done. UA negative. Encouraged to push fluids and nutrition and get adequate sleep. If  no improvement in symptoms will need follow up in 1 week for further evaluation.   Orders: -     Urinalysis, Routine w reflex microscopic -     CBC with Differential/Platelet -     Thyroid Panel With TSH -     EKG 12-Lead -     COMPLETE METABOLIC PANEL WITH GFR  Other orders -     Microscopic Message     Follow up plan: Return in about 1  week (around 06/02/2023), or if symptoms worsen or fail to improve.  Park Meo, FNP

## 2023-05-27 LAB — COMPLETE METABOLIC PANEL WITH GFR
AG Ratio: 2.1 (calc) (ref 1.0–2.5)
ALT: 7 U/L (ref 6–29)
AST: 15 U/L (ref 10–35)
Albumin: 4.4 g/dL (ref 3.6–5.1)
Alkaline phosphatase (APISO): 38 U/L (ref 37–153)
BUN/Creatinine Ratio: 12 (calc) (ref 6–22)
BUN: 13 mg/dL (ref 7–25)
CO2: 23 mmol/L (ref 20–32)
Calcium: 9.3 mg/dL (ref 8.6–10.4)
Chloride: 102 mmol/L (ref 98–110)
Creat: 1.06 mg/dL — ABNORMAL HIGH (ref 0.50–1.03)
Globulin: 2.1 g/dL (ref 1.9–3.7)
Glucose, Bld: 102 mg/dL — ABNORMAL HIGH (ref 65–99)
Potassium: 4.1 mmol/L (ref 3.5–5.3)
Sodium: 139 mmol/L (ref 135–146)
Total Bilirubin: 0.3 mg/dL (ref 0.2–1.2)
Total Protein: 6.5 g/dL (ref 6.1–8.1)
eGFR: 61 mL/min/{1.73_m2} (ref >=60)

## 2023-05-27 LAB — CBC WITH DIFFERENTIAL/PLATELET
Absolute Lymphocytes: 1760 {cells}/uL (ref 850–3900)
Absolute Monocytes: 598 {cells}/uL (ref 200–950)
Basophils Absolute: 31 {cells}/uL (ref 0–200)
Basophils Relative: 0.7 %
Eosinophils Absolute: 132 {cells}/uL (ref 15–500)
Eosinophils Relative: 3 %
HCT: 35.8 % (ref 35.0–45.0)
Hemoglobin: 11.2 g/dL — ABNORMAL LOW (ref 11.7–15.5)
MCH: 26.9 pg — ABNORMAL LOW (ref 27.0–33.0)
MCHC: 31.3 g/dL — ABNORMAL LOW (ref 32.0–36.0)
MCV: 86.1 fL (ref 80.0–100.0)
MPV: 9.9 fL (ref 7.5–12.5)
Monocytes Relative: 13.6 %
Neutro Abs: 1879 {cells}/uL (ref 1500–7800)
Neutrophils Relative %: 42.7 %
Platelets: 249 10*3/uL (ref 140–400)
RBC: 4.16 10*6/uL (ref 3.80–5.10)
RDW: 13.2 % (ref 11.0–15.0)
Total Lymphocyte: 40 %
WBC: 4.4 10*3/uL (ref 3.8–10.8)

## 2023-05-27 LAB — THYROID PANEL WITH TSH
Free Thyroxine Index: 2.1 (ref 1.4–3.8)
T3 Uptake: 32 % (ref 22–35)
T4, Total: 6.5 ug/dL (ref 5.1–11.9)
TSH: 1.6 m[IU]/L (ref 0.40–4.50)

## 2023-05-27 LAB — TEST AUTHORIZATION

## 2023-05-27 LAB — D-DIMER, QUANTITATIVE

## 2023-06-02 ENCOUNTER — Ambulatory Visit (INDEPENDENT_AMBULATORY_CARE_PROVIDER_SITE_OTHER): Payer: BC Managed Care – PPO

## 2023-06-02 DIAGNOSIS — I442 Atrioventricular block, complete: Secondary | ICD-10-CM

## 2023-06-02 LAB — CUP PACEART REMOTE DEVICE CHECK
Battery Remaining Longevity: 60 mo
Battery Remaining Percentage: 65 %
Battery Voltage: 2.98 V
Brady Statistic AP VP Percent: 19 %
Brady Statistic AP VS Percent: 1 %
Brady Statistic AS VP Percent: 81 %
Brady Statistic AS VS Percent: 1 %
Brady Statistic RA Percent Paced: 19 %
Date Time Interrogation Session: 20241120020017
HighPow Impedance: 72 Ohm
HighPow Impedance: 72 Ohm
Lead Channel Impedance Value: 410 Ohm
Lead Channel Impedance Value: 450 Ohm
Lead Channel Impedance Value: 510 Ohm
Lead Channel Pacing Threshold Amplitude: 0.25 V
Lead Channel Pacing Threshold Amplitude: 0.5 V
Lead Channel Pacing Threshold Amplitude: 1.5 V
Lead Channel Pacing Threshold Pulse Width: 0.5 ms
Lead Channel Pacing Threshold Pulse Width: 0.5 ms
Lead Channel Pacing Threshold Pulse Width: 0.5 ms
Lead Channel Sensing Intrinsic Amplitude: 12 mV
Lead Channel Sensing Intrinsic Amplitude: 3.4 mV
Lead Channel Setting Pacing Amplitude: 0.25 V
Lead Channel Setting Pacing Amplitude: 1.5 V
Lead Channel Setting Pacing Amplitude: 2.5 V
Lead Channel Setting Pacing Pulse Width: 0.05 ms
Lead Channel Setting Pacing Pulse Width: 0.5 ms
Lead Channel Setting Sensing Sensitivity: 0.5 mV
Pulse Gen Serial Number: 9956643
Zone Setting Status: 755011

## 2023-06-03 ENCOUNTER — Other Ambulatory Visit: Payer: Self-pay | Admitting: Family Medicine

## 2023-06-03 DIAGNOSIS — D649 Anemia, unspecified: Secondary | ICD-10-CM

## 2023-06-07 ENCOUNTER — Ambulatory Visit: Payer: BC Managed Care – PPO | Attending: Cardiology | Admitting: Cardiology

## 2023-06-07 ENCOUNTER — Encounter: Payer: Self-pay | Admitting: Cardiology

## 2023-06-07 VITALS — BP 125/80 | HR 74 | Resp 16 | Wt 162.0 lb

## 2023-06-07 DIAGNOSIS — I5022 Chronic systolic (congestive) heart failure: Secondary | ICD-10-CM | POA: Diagnosis not present

## 2023-06-07 NOTE — Progress Notes (Signed)
PCP: Donita Brooks, MD EP: Dr. Graciela Husbands HF cardiology: Dr. Shirlee Latch  57 y.o. with history of complete heart block s/p St Jude BiV ICD placement and suspected cardiac sarcoidosis was referred by Dr. Graciela Husbands for management of cardiac sarcoidosis. Patient generally has been in good health, no major medical problems until 5/22.  In 5/22, she developed "dizzy spells."  She would check her pulse and find it to be in the 30s.  She did not pass out.  She had an ECG at her PCP's office showing complete heart block and was admitted.  Echo showed normal LV systolic function.  Cardiac MRI was done due to concern for possible cardiac sarcoidosis as cause of CHB in a relatively young woman.  Cardiac MRI showed a mid-wall septal LGE pattern with elevated septal T2 signal that was concerning for cardiac sarcoidosis.  Patient had implantation of St Jude CRT-D device.  She then had a cardiac PET at [facility] which showed inflammation in the septum and the RV free wall along with a septal perfusion defect.  She was seen by Dr. Graciela Husbands and was started on prednisone and methotrexate for treatment of high probability cardiac sarcoidosis.   CT chest in 9/22 did not show signs of pulmonary sarcoidosis.   Repeat cardiac PET in 3/23 showed EF 59%, several areas of hypermetabolic activity suggestive of active inflammatory process in the LV, similar to prior cPET.   Echo in 8/23 showed EF 55%, basal septal hypokinesis, mildly decreased RV systolic function.  Cardiac PET in 4/24 showed EF 56%, no abnormal metabolism (no evidence for active inflammation).   Patient returns for followup of cardiac sarcoidosis.  She is now on 20 mg MTX and is off prednisone.  She had a recent viral-type URI with poor energy and cough, but is now recovering.  Currently feeling fine, no exertional dyspnea or chest pain.  No palpitations or syncope.   ECG (personally reviewed): NSR, BiV pacing.   Labs (5/22): K 4, creatinine 0.98 Labs (11/22): K 4.7,  creatinine 1.18, LFTs normal Labs (12/22): hgb 13.2, K 4.2, creatinine 1.2 Labs (2/23): LFTs normal, K 4, creatinine 1.08 Labs (7/23): K 3.9, creatinine 0.93, LFTs normal, hgb 12.3 Labs (8/23): ACE level 82 (upper normal), hgb 12.5, K 4.1, creatinine 0.92, LFTs normal Labs (1/24): LDL 135, HDL 55 Labs (11/24): K 4.1, creatinine 1.06, LFTs normal, hgb 11.2  PMH: 1. H/o Shingles 2. Ovarian cyst 3. Complete heart block: Thought to be due to cardiac sarcoidosis, s/p placement of St Jude CRT-D device.  4. Cardiac sarcoidosis: Strong suspicion.  Patient presented in 5/22 with complete heart block.  - Echo (5/22): EF 60-65%, normal RV, PASP 36 mmHg.  - Cardiac MRI (5/22): LV EF 64%, increased T2 signal in septum, mid-wall septal LGE (non-MI pattern), RV EF 61%.  - Cardiac PET (7/22): LV EF 57%, inflammation in 57% of the septal wall and 17% of the total LV, inflammation also involved the RV free wall.  There was a septal perfusion defect. This was concerning for possible cardiac sarcoidosis.  - High resolution CT chest (9/22): No signs of sarcoidosis, small RUL nodule.  - Cardiac PET (3/23): EF 59%, several areas of hypermetabolic activity suggestive of active inflammatory process in the LV, similar to prior cPET.  - Echo (8/23): EF 55%, basal septal hypokinesis, mildly decreased RV systolic function. - Cardiac PET (4/24): EF 56%, no abnormal metabolism (no evidence for active inflammation). 5. Lung nodule: following with serial CTs 6. Calcium score scan (2/24):  calcium score 0   SH: Single, [place] from [place] originally, now lives in [place], nonsmoker, no ETOH/drugs, working full time.   FH: No family history of sarcoidosis or cardiomyopathy.   ROS: All systems reviewed and negative except as per HPI.   Current Outpatient Medications  Medication Sig Dispense Refill   BIOTIN MAXIMUM PO Take 10,000 mcg by mouth daily.     carvedilol (COREG) 3.125 MG tablet TAKE ONE TABLET BY MOUTH  TWICE A DAY 180 tablet 3   Cholecalciferol (VITAMIN D3) 25 MCG (1000 UT) CAPS Take 1 capsule by mouth daily.     ferrous sulfate 325 (65 FE) MG tablet Take 325 mg by mouth daily with breakfast.     fluticasone (FLONASE) 50 MCG/ACT nasal spray Place 2 sprays into both nostrils daily. 16 g 6   folic acid (FOLVITE) 1 MG tablet Take 2 mg by mouth daily.     furosemide (LASIX) 20 MG tablet TAKE ONE TABLET BY MOUTH ONCE A DAY 90 tablet 3   methotrexate (RHEUMATREX) 2.5 MG tablet Take 8 tablets (20 mg total) by mouth once a week. Caution:Chemotherapy. Protect from light. 94 tablet 3   Multiple Vitamins-Minerals (VITAMIN D3 COMPLETE PO) Take 1 tablet by mouth daily.     potassium chloride (KLOR-CON) 10 MEQ tablet TAKE ONE TABLET BY MOUTH ONCE A DAY 90 tablet 3   venlafaxine (EFFEXOR) 37.5 MG tablet Take 37.5 mg by mouth daily.     vitamin B-12 (CYANOCOBALAMIN) 500 MCG tablet Take 1 tablet (500 mcg total) by mouth every morning.     vitamin E 180 MG (400 UNITS) capsule Take 400 Units by mouth daily.     No current facility-administered medications for this visit.   BP 125/80   Pulse 74   Resp 16   Wt 162 lb (73.5 kg)   LMP  (Approximate)   SpO2 100%   BMI 26.96 kg/m  General: NAD Neck: No JVD, no thyromegaly or thyroid nodule.  Lungs: Clear to auscultation bilaterally with normal respiratory effort. CV: Nondisplaced PMI.  Heart regular S1/S2, no S3/S4, no murmur.  No peripheral edema.  No carotid bruit.  Normal pedal pulses.  Abdomen: Soft, nontender, no hepatosplenomegaly, no distention.  Skin: Intact without lesions or rashes.  Neurologic: Alert and oriented x 3.  Psych: Normal affect. Extremities: No clubbing or cyanosis.  HEENT: Normal.   Assessment/Plan: 1. Complete heart block: In a young woman, there was concern for cardiac sarcoidosis as the cause.  Workup with cMRI and cPET was highly concerning for cardiac sarcoidosis.  She now has a Secondary school teacher CRT-D device.   - Dr. Graciela Husbands follows  CRT-D device, functioning normally.  2. Cardiac sarcoidosis: Strong suspicion for cardiac sarcoidosis in a relatively young woman with complete heart block and both cMRI and cPET that are consistent with cardiac sarcoidosis.  She does not have evidence for extracardiac sarcoidosis; 25% of cardiac sarcoidosis, however, is isolated to the heart.  No skin lesions and exam by eye doctor was unremarkable per her report.  No pulmonary symptoms, CT chest in 9/22 did not show signs of sarcoidosis.   We do not have a tissue diagnosis, but endomyocardial biopsy has a relatively low sensitivity in cardiac sarcoidosis.  Given high probability CS, we have treated her based on the data that we have. She is now off prednisone and on goal dose MTX.  cPET in 3/23 showed EF 59%, several areas of hypermetabolic activity suggestive of active inflammatory process in the  LV, similar to prior cPET. Echo in 8/23 showed EF 55%, basal septal hypokinesis (consistent with septal site for LGE on cMRI).  Most recent cPET in 4/24 showed significant improvement, EF 56%, no abnormal metabolism (no evidence for active inflammation).  No dyspnea or chest pain, not volume overloaded on exam.  - She is seeing rheumatology (Dr Dierdre Forth).  - Continue MTX 20 mg weekly.  She is tolerating this well and most recent cPET showed no active inflammation.   - She will need q 3 month LFTs with MTX at this point (check today and again in 3 months).   - Continue Coreg 3.125 mg bid.   - Continue Lasix 20 mg daily with KCl 10 daily.  BMET today.  - I will arrange for repeat echo.  - I will get a repeat cardiac PET some time next year.  3. Pulmonary nodule: Stable nodule on 5/23 CT.  - Repeat CT chest w/o contrast in 2 yrs (5/25).   Followup in 6 months in [facility] CHF office, CMET 3 months.   Marca Ancona 06/07/2023

## 2023-06-07 NOTE — Patient Instructions (Signed)
There has been no changes to your medications.  Go over to the [facility]ICAL [facility]. Go pass the gift shop and have your blood work completed in 3 months  We will only call you if the results are abnormal or if the provider would like to make medication changes.  Please have your echo completed. You will check in for this at the [facility]. You have to arrive 15 MINS EARLY for preparation, otherwise you will have to reschedule. YOU WILL BE CALLED TO HAVE THIS TEST ARRANGED   Your physician recommends that you schedule a follow-up appointment in: 6 months (May 2025) ** PLEASE CALL THE OFFICE IN MARCH 2025 TO ARRANGE YOUR FOLLOW UP APPOINTMENT. **

## 2023-06-08 ENCOUNTER — Other Ambulatory Visit: Payer: Self-pay

## 2023-06-08 DIAGNOSIS — D649 Anemia, unspecified: Secondary | ICD-10-CM

## 2023-06-08 NOTE — Telephone Encounter (Signed)
Copied from CRM [id number]. Topic: General - Other >> Jun 07, 2023  2:55 PM Hector Shade B wrote: Reason for CRM: returning call to nurse. Patient stated nurse had called for her left a message for her to call the office. Requesting a callback from Ms. Aliyah     06/08/23 attempted to return pt's call. LVM again.

## 2023-06-13 ENCOUNTER — Encounter: Payer: Self-pay | Admitting: Family Medicine

## 2023-06-14 ENCOUNTER — Other Ambulatory Visit: Payer: BC Managed Care – PPO

## 2023-06-15 ENCOUNTER — Other Ambulatory Visit: Payer: BC Managed Care – PPO

## 2023-06-15 DIAGNOSIS — D649 Anemia, unspecified: Secondary | ICD-10-CM | POA: Diagnosis not present

## 2023-06-15 LAB — CBC WITH DIFFERENTIAL/PLATELET
Absolute Lymphocytes: 1183 {cells}/uL (ref 850–3900)
Absolute Monocytes: 237 {cells}/uL (ref 200–950)
Basophils Absolute: 21 {cells}/uL (ref 0–200)
Basophils Relative: 0.8 %
Eosinophils Absolute: 60 {cells}/uL (ref 15–500)
Eosinophils Relative: 2.3 %
HCT: 36 % (ref 35.0–45.0)
Hemoglobin: 11.4 g/dL — ABNORMAL LOW (ref 11.7–15.5)
MCH: 27.5 pg (ref 27.0–33.0)
MCHC: 31.7 g/dL — ABNORMAL LOW (ref 32.0–36.0)
MCV: 86.7 fL (ref 80.0–100.0)
MPV: 10.2 fL (ref 7.5–12.5)
Monocytes Relative: 9.1 %
Neutro Abs: 1100 {cells}/uL — ABNORMAL LOW (ref 1500–7800)
Neutrophils Relative %: 42.3 %
Platelets: 197 10*3/uL (ref 140–400)
RBC: 4.15 10*6/uL (ref 3.80–5.10)
RDW: 13.9 % (ref 11.0–15.0)
Total Lymphocyte: 45.5 %
WBC: 2.6 10*3/uL — ABNORMAL LOW (ref 3.8–10.8)

## 2023-06-16 ENCOUNTER — Telehealth: Payer: Self-pay

## 2023-06-16 NOTE — Telephone Encounter (Signed)
Copied from CRM [id number]. Topic: Clinical - Lab/Test Results >> Jun 16, 2023 10:17 AM Gaetano Hawthorne wrote: Reason for CRM: Patient has some questions regarding her lab results - patient has been exchanging message via Clinical cytogeneticist with [facility]. Please call when you have a moment - Patient concerned about some of the values.

## 2023-06-17 ENCOUNTER — Encounter: Payer: Self-pay | Admitting: Family Medicine

## 2023-06-18 ENCOUNTER — Encounter: Payer: Self-pay | Admitting: Cardiology

## 2023-06-18 ENCOUNTER — Telehealth (HOSPITAL_COMMUNITY): Payer: Self-pay | Admitting: Pharmacist

## 2023-06-18 DIAGNOSIS — I5022 Chronic systolic (congestive) heart failure: Secondary | ICD-10-CM

## 2023-06-18 DIAGNOSIS — D869 Sarcoidosis, unspecified: Secondary | ICD-10-CM

## 2023-06-18 MED ORDER — METHOTREXATE SODIUM 2.5 MG PO TABS: ORAL_TABLET | ORAL | 3 refills | Status: DC

## 2023-06-18 NOTE — Telephone Encounter (Signed)
Received message from Dr. Shirlee Latch regarding decreased WBC and HgB on recent labs thought to be related to methotrexate. Since patients' last PET scan was normal, Dr. Shirlee Latch would like to decrease her methotrexate to 15mg /week for 1 week, then decrease to 10 mg/week. Plan for repeat CBC on 07/15/23 (patient not able to go the week prior due to the holiday). [facility] get [facility].   Dr. Shirlee Latch would also like her to get a repeat PET scan in the new year and follow up with him in two months. Patient indicates she prefers the [facility] location. Discussed with their nursing staff and they will work on putting lab orders in and getting follow up appointments scheduled. Updated methotrexate prescription sent to her pharmacy.   Karle Plumber, PharmD, BCPS, BCCP, CPP Heart Failure Clinic Pharmacist [phone]

## 2023-06-18 NOTE — Addendum Note (Signed)
Addended by: Noralee Space on: 06/18/2023 03:32 PM   Modules accepted: Orders

## 2023-06-18 NOTE — Telephone Encounter (Signed)
Spoke w/pt, order placed for CBC pt is aware come on 1/2, per Dr Shirlee Latch will hold off on ordering PET at this time, he would like to see her in f/u 1st, appt sch for 08/18/23

## 2023-06-21 ENCOUNTER — Encounter: Payer: Self-pay | Admitting: Family Medicine

## 2023-06-21 ENCOUNTER — Ambulatory Visit: Payer: BC Managed Care – PPO | Admitting: Family Medicine

## 2023-06-21 VITALS — BP 120/68 | HR 80 | Temp 98.4°F | Ht 65.0 in | Wt 161.5 lb

## 2023-06-21 DIAGNOSIS — D61818 Other pancytopenia: Secondary | ICD-10-CM

## 2023-06-21 NOTE — Progress Notes (Signed)
Subjective:    Patient ID: Sheryl Gonzalez, female    DOB: 05/19/1966, 57 y.o.   MRN: [id number]  HPI  Patient has a history of biventricular ICD placement secondary to severe bradycardia due to sarcoidosis.  She is currently on methotrexate and achieved remission on her most recent PET scan.  However approximately 1 month ago she started developing severe fatigue and lack of energy.  She was seen here.  Her hemoglobin was slightly low at 11.2.  Thyroid test was normal.  Iron was borderline low.  B12 and folic acid were excellent.  The remainder of her labs were normal.  After taking iron, she checked her labs and her white blood cell count had fallen to 2.6.  Therefore she was developing pancytopenia.  She contacted her rheumatologist and her cardiologist who recommended decreasing the dose of her methotrexate from 20 mg a week to 15 mg a week and 11 g a week.  She starting to feel better now.  She plans to recheck her CBC in January and then we will repeat an echocardiogram in February. Past Medical History:  Diagnosis Date   Anxiety    Biventricular ICD (implantable cardioverter-defibrillator) in place s/p placement 11/29/20 11/30/2020   Colon polyps    Iron deficiency anemia due to chronic blood loss    Menorrhagia    Sarcoidosis 11/30/2020   Seasonal allergies    Symptomatic bradycardia 11/30/2020   Wears glasses    Past Surgical History:  Procedure Laterality Date   BIV ICD INSERTION CRT-D N/A 11/29/2020   Procedure: BIV ICD INSERTION CRT-D;  Surgeon: Duke Salvia, MD;  Location: [facility] INVASIVE CV LAB;  Service: Cardiovascular;  Laterality: N/A;   BREAST BIOPSY  2009   lt   BREAST BIOPSY Left 10/12/2012   Procedure: LEFT NEEDLE LOCALIZATION EXCISIONAL BREAST BIOPSY;  Surgeon: Almond Lint, MD;  Location: [facility];  Service: General;  Laterality: Left;   BREAST DUCTAL SYSTEM EXCISION Left 10/12/2012   Procedure: MAJOR DUCTAL EXCISION;  Surgeon: Almond Lint, MD;   Location: [facility];  Service: General;  Laterality: Left;   COLONOSCOPY     ROBOTIC ASSISTED BILATERAL SALPINGO OOPHERECTOMY Bilateral 06/26/2014   Procedure: ROBOTIC ASSISTED BILATERAL SALPINGECTOMY, RIGHT OOPHORECTOMY;  Surgeon: Laurette Schimke, MD;  Location: [facility];  Service: Gynecology;  Laterality: Bilateral;   Current Outpatient Medications on File Prior to Visit  Medication Sig Dispense Refill   BIOTIN MAXIMUM PO Take 10,000 mcg by mouth daily.     carvedilol (COREG) 3.125 MG tablet TAKE ONE TABLET BY MOUTH TWICE A DAY 180 tablet 3   Cholecalciferol (VITAMIN D3) 25 MCG (1000 UT) CAPS Take 1 capsule by mouth daily.     ferrous sulfate 325 (65 FE) MG tablet Take 325 mg by mouth daily with breakfast.     folic acid (FOLVITE) 1 MG tablet Take 2 mg by mouth daily.     furosemide (LASIX) 20 MG tablet TAKE ONE TABLET BY MOUTH ONCE A DAY 90 tablet 3   methotrexate (RHEUMATREX) 2.5 MG tablet Take 6 tablets (15 mg total) by mouth once a week on Wednesday for one week, then take 4 tablets (10 mg) by mouth once a week 50 tablet 3   Multiple Vitamins-Minerals (VITAMIN D3 COMPLETE PO) Take 1 tablet by mouth daily.     potassium chloride (KLOR-CON) 10 MEQ tablet TAKE ONE TABLET BY MOUTH ONCE A DAY 90 tablet 3   venlafaxine (EFFEXOR) 37.5 MG tablet Take 37.5  mg by mouth daily.     vitamin B-12 (CYANOCOBALAMIN) 500 MCG tablet Take 1 tablet (500 mcg total) by mouth every morning.     vitamin E 180 MG (400 UNITS) capsule Take 400 Units by mouth daily.     fluticasone (FLONASE) 50 MCG/ACT nasal spray Place 2 sprays into both nostrils daily. 16 g 6   No current facility-administered medications on file prior to visit.   Allergies  Allergen Reactions   Chlorhexidine Rash   Chlorhexidine Gluconate Itching, Rash and Other (See Comments)    blisters   Social History   Socioeconomic History   Marital status: Single    Spouse name: Not on file   Number of children: Not on file   Years  of education: Not on file   Highest education level: Bachelor's degree (e.g., BA, AB, BS)  Occupational History   Not on file  Tobacco Use   Smoking status: Never   Smokeless tobacco: Never  Substance and Sexual Activity   Alcohol use: No    Alcohol/week: 0.0 standard drinks of alcohol   Drug use: No   Sexual activity: Never  Other Topics Concern   Not on file  Social History Narrative   Not on file   Social Determinants of Health   Financial Resource Strain: Low Risk  (05/11/2023)   Overall Financial Resource Strain (CARDIA)    Difficulty of Paying Living Expenses: Not hard at all  Food Insecurity: No Food Insecurity (05/11/2023)   Hunger Vital Sign    Worried About Running Out of Food in the Last Year: Never true    Ran Out of Food in the Last Year: Never true  Transportation Needs: No Transportation Needs (05/11/2023)   PRAPARE - Administrator, Civil Service (Medical): No    Lack of Transportation (Non-Medical): No  Physical Activity: Insufficiently Active (05/11/2023)   Exercise Vital Sign    Days of Exercise per Week: 2 days    Minutes of Exercise per Session: 30 min  Stress: No Stress Concern Present (05/11/2023)   [employer] of Occupational Health - Occupational Stress Questionnaire    Feeling of Stress : Only a little  Social Connections: Moderately Isolated (05/11/2023)   Social Connection and Isolation Panel [NHANES]    Frequency of Communication with Friends and Family: More than three times a week    Frequency of Social Gatherings with Friends and Family: More than three times a week    Attends Religious Services: 1 to 4 times per year    Active Member of [employer] or Organizations: No    Attends Engineer, structural: Not on file    Marital Status: Never married  Catering manager Violence: Not on file   Family History  Problem Relation Age of Onset   Arthritis Other    Uterine cancer Other        parent   Kidney disease Other     Diabetes Other    Cancer Mother        ? Cervical/Ovarian   Arthritis Mother    Diabetes Mother    Arthritis Sister    Diabetes Sister    Kidney disease Sister        transplant    Heart disease Maternal Uncle    Kidney disease Maternal Uncle    Kidney disease Cousin    Colon cancer Neg Hx       Review of Systems  All other systems reviewed and are negative.  Objective:   Physical Exam Vitals reviewed.  Constitutional:      General: She is not in acute distress.    Appearance: She is well-developed. She is not diaphoretic.  HENT:     Head: Normocephalic and atraumatic.     Right Ear: External ear normal.     Left Ear: External ear normal.     Nose: Nose normal.     Mouth/Throat:     Pharynx: No oropharyngeal exudate.  Eyes:     General: No scleral icterus.       Right eye: No discharge.        Left eye: No discharge.     Conjunctiva/sclera: Conjunctivae normal.     Pupils: Pupils are equal, round, and reactive to light.  Neck:     Thyroid: No thyromegaly.     Vascular: No JVD.     Trachea: No tracheal deviation.  Cardiovascular:     Rate and Rhythm: Regular rhythm. Bradycardia present.     Heart sounds: Normal heart sounds. No murmur heard.    No friction rub. No gallop.  Pulmonary:     Effort: Pulmonary effort is normal. No respiratory distress.     Breath sounds: Normal breath sounds. No stridor. No wheezing or rales.  Chest:     Chest wall: No tenderness.  Abdominal:     General: Bowel sounds are normal. There is no distension.     Palpations: Abdomen is soft. There is no mass.     Tenderness: There is no abdominal tenderness. There is no guarding or rebound.  Musculoskeletal:        General: No tenderness. Normal range of motion.     Cervical back: Normal range of motion and neck supple.  Lymphadenopathy:     Cervical: No cervical adenopathy.  Skin:    General: Skin is warm.     Coloration: Skin is not pale.     Findings: No erythema or rash.   Neurological:     Mental Status: She is alert and oriented to person, place, and time.     Cranial Nerves: No cranial nerve deficit.     Motor: No abnormal muscle tone.     Coordination: Coordination normal.     Deep Tendon Reflexes: Reflexes are normal and symmetric.  Psychiatric:        Behavior: Behavior normal.        Thought Content: Thought content normal.        Judgment: Judgment normal.           Assessment & Plan:  Pancytopenia (HCC) - Plan: Ferritin, Vitamin B12, Iron, Vitamin B1, Folate I believe the patient has fatigue secondary to pancytopenia likely triggered by her methotrexate.  Await to see if her CBC improves on lower dose of methotrexate.  If the CBC improves, then the only question would be whether she can maintain remission on the lower dose.  If not she may need to discuss with her cardiologist and her rheumatologist switching medication.  If the pancytopenia does not improve on lower doses of methotrexate, we may need to pursue hematology workup/consultation.  Await the results of her CBC in January

## 2023-06-29 NOTE — Progress Notes (Signed)
Remote ICD transmission.   

## 2023-07-15 ENCOUNTER — Other Ambulatory Visit
Admission: RE | Admit: 2023-07-15 | Discharge: 2023-07-15 | Disposition: A | Discharge status: Home / Self Care | Payer: BC Managed Care – PPO | Source: Ambulatory Visit | Attending: Cardiology | Admitting: Cardiology

## 2023-07-15 DIAGNOSIS — I5022 Chronic systolic (congestive) heart failure: Secondary | ICD-10-CM | POA: Diagnosis not present

## 2023-07-15 DIAGNOSIS — D869 Sarcoidosis, unspecified: Secondary | ICD-10-CM | POA: Insufficient documentation

## 2023-07-15 LAB — CBC
HCT: 39.3 % (ref 36.0–46.0)
Hemoglobin: 12.4 g/dL (ref 12.0–15.0)
MCH: 27.9 pg (ref 26.0–34.0)
MCHC: 31.6 g/dL (ref 30.0–36.0)
MCV: 88.3 fL (ref 80.0–100.0)
Platelets: 240 10*3/uL (ref 150–400)
RBC: 4.45 MIL/uL (ref 3.87–5.11)
RDW: 14.6 % (ref 11.5–15.5)
WBC: 3.6 10*3/uL — ABNORMAL LOW (ref 4.0–10.5)
nRBC: 0 % (ref 0.0–0.2)

## 2023-07-20 ENCOUNTER — Telehealth (HOSPITAL_COMMUNITY): Payer: Self-pay

## 2023-07-20 NOTE — Telephone Encounter (Addendum)
 Pt aware, agreeable, and verbalized understanding   ----- Message from Marca Ancona sent at 07/15/2023 11:06 PM EST ----- Hgb and WBCs are higher.

## 2023-08-18 ENCOUNTER — Ambulatory Visit: Payer: BC Managed Care – PPO | Attending: Cardiology | Admitting: Cardiology

## 2023-08-18 VITALS — BP 135/86 | HR 63

## 2023-08-18 DIAGNOSIS — E785 Hyperlipidemia, unspecified: Secondary | ICD-10-CM | POA: Diagnosis not present

## 2023-08-18 DIAGNOSIS — D869 Sarcoidosis, unspecified: Secondary | ICD-10-CM

## 2023-08-18 DIAGNOSIS — I5022 Chronic systolic (congestive) heart failure: Secondary | ICD-10-CM | POA: Diagnosis not present

## 2023-08-18 NOTE — Progress Notes (Signed)
 PCP: Duanne Butler DASEN, MD EP: Dr. Fernande HF cardiology: Dr. Rolan  58 y.o. with history of complete heart block s/p St Jude BiV ICD placement and suspected cardiac sarcoidosis was referred by Dr. Fernande for management of cardiac sarcoidosis. Patient generally has been in good health, no major medical problems until 5/22.  In 5/22, she developed dizzy spells.  She would check her pulse and find it to be in the 30s.  She did not pass out.  She had an ECG at her PCP's office showing complete heart block and was admitted.  Echo showed normal LV systolic function.  Cardiac MRI was done due to concern for possible cardiac sarcoidosis as cause of CHB in a relatively young woman.  Cardiac MRI showed a mid-wall septal LGE pattern with elevated septal T2 signal that was concerning for cardiac sarcoidosis.  Patient had implantation of St Jude CRT-D device.  She then had a cardiac PET at [facility] which showed inflammation in the septum and the RV free wall along with a septal perfusion defect.  She was seen by Dr. Fernande and was started on prednisone  and methotrexate  for treatment of high probability cardiac sarcoidosis.   CT chest in 9/22 did not show signs of pulmonary sarcoidosis.   Repeat cardiac PET in 3/23 showed EF 59%, several areas of hypermetabolic activity suggestive of active inflammatory process in the LV, similar to prior cPET.   Echo in 8/23 showed EF 55%, basal septal hypokinesis, mildly decreased RV systolic function.  Cardiac PET in 4/24 showed EF 56%, no abnormal metabolism (no evidence for active inflammation).   In 11/24, patient developed fatigue, cough congestion.  Suspect viral illness, but CBC was done and new leukopenia and anemia were noted.  Given concern that this could be triggered by methotrexate , the dose of methotrexate  was decreased to 10 mg qwk.   Patient returns for followup of cardiac sarcoidosis.  She is now on 10 mg methotrexate  weekly.  Last CBC in 1/25 showed that hgb was  back to normal and WBCs trending up.  She has no fatigue, no cough.  No exertional dyspnea or chest pain.   Overall, she is feeling good.    St Jude device interrogation: >99% BiV pacing, no VT/AF, stable thoracic impedance.   Labs (5/22): K 4, creatinine 0.98 Labs (11/22): K 4.7, creatinine 1.18, LFTs normal Labs (12/22): hgb 13.2, K 4.2, creatinine 1.2 Labs (2/23): LFTs normal, K 4, creatinine 1.08 Labs (7/23): K 3.9, creatinine 0.93, LFTs normal, hgb 12.3 Labs (8/23): ACE level 82 (upper normal), hgb 12.5, K 4.1, creatinine 0.92, LFTs normal Labs (1/24): LDL 135, HDL 55 Labs (11/24): K 4.1, creatinine 1.06, LFTs normal, hgb 11.2, WBC 2.6 Labs (1/25): hgb 12.4, WBCs 3.6  PMH: 1. H/o Shingles 2. Ovarian cyst 3. Complete heart block: Thought to be due to cardiac sarcoidosis, s/p placement of St Jude CRT-D device.  4. Cardiac sarcoidosis: Strong suspicion.  Patient presented in 5/22 with complete heart block.  - Echo (5/22): EF 60-65%, normal RV, PASP 36 mmHg.  - Cardiac MRI (5/22): LV EF 64%, increased T2 signal in septum, mid-wall septal LGE (non-MI pattern), RV EF 61%.  - Cardiac PET (7/22): LV EF 57%, inflammation in 57% of the septal wall and 17% of the total LV, inflammation also involved the RV free wall.  There was a septal perfusion defect. This was concerning for possible cardiac sarcoidosis.  - High resolution CT chest (9/22): No signs of sarcoidosis, small RUL nodule.  -  Cardiac PET (3/23): EF 59%, several areas of hypermetabolic activity suggestive of active inflammatory process in the LV, similar to prior cPET.  - Echo (8/23): EF 55%, basal septal hypokinesis, mildly decreased RV systolic function. - Cardiac PET (4/24): EF 56%, no abnormal metabolism (no evidence for active inflammation). 5. Lung nodule: following with serial CTs 6. Calcium  score scan (2/24): calcium  score 0   SH: Single, [place] from [place] originally, now lives in [place], nonsmoker, no  ETOH/drugs, working full time.   FH: No family history of sarcoidosis or cardiomyopathy.   ROS: All systems reviewed and negative except as per HPI.   Current Outpatient Medications  Medication Sig Dispense Refill   BIOTIN  MAXIMUM PO Take 10,000 mcg by mouth daily.     carvedilol  (COREG ) 3.125 MG tablet TAKE ONE TABLET BY MOUTH TWICE A DAY 180 tablet 3   Cholecalciferol  (VITAMIN D3) 25 MCG (1000 UT) CAPS Take 1 capsule by mouth daily.     ferrous sulfate 325 (65 FE) MG tablet Take 325 mg by mouth daily with breakfast.     folic acid  (FOLVITE ) 1 MG tablet Take 2 mg by mouth daily.     furosemide  (LASIX ) 20 MG tablet TAKE ONE TABLET BY MOUTH ONCE A DAY 90 tablet 3   methotrexate  (RHEUMATREX) 2.5 MG tablet Take 6 tablets (15 mg total) by mouth once a week on Wednesday for one week, then take 4 tablets (10 mg) by mouth once a week 50 tablet 3   Multiple Vitamins-Minerals (VITAMIN D3 COMPLETE PO) Take 1 tablet by mouth daily.     potassium chloride  (KLOR-CON ) 10 MEQ tablet TAKE ONE TABLET BY MOUTH ONCE A DAY 90 tablet 3   venlafaxine  (EFFEXOR ) 37.5 MG tablet Take 37.5 mg by mouth daily.     vitamin B-12 (CYANOCOBALAMIN ) 500 MCG tablet Take 1 tablet (500 mcg total) by mouth every morning.     vitamin E 180 MG (400 UNITS) capsule Take 400 Units by mouth daily.     No current facility-administered medications for this visit.   BP 135/86   Pulse 63   SpO2 98%  General: NAD Neck: No JVD, no thyromegaly or thyroid  nodule.  Lungs: Clear to auscultation bilaterally with normal respiratory effort. CV: Nondisplaced PMI.  Heart regular S1/S2, no S3/S4, no murmur.  No peripheral edema.  No carotid bruit.  Normal pedal pulses.  Abdomen: Soft, nontender, no hepatosplenomegaly, no distention.  Skin: Intact without lesions or rashes.  Neurologic: Alert and oriented x 3.  Psych: Normal affect. Extremities: No clubbing or cyanosis.  HEENT: Normal.   Assessment/Plan: 1. Complete heart block: In a young  woman, there was concern for cardiac sarcoidosis as the cause.  Workup with cMRI and cPET was highly concerning for cardiac sarcoidosis.  She now has a Secondary School Teacher CRT-D device.   - Dr. Fernande follows CRT-D device, functioning normally.  2. Cardiac sarcoidosis: Strong suspicion for cardiac sarcoidosis in a relatively young woman with complete heart block and both cMRI and cPET that are consistent with cardiac sarcoidosis.  She does not have evidence for extracardiac sarcoidosis; 25% of cardiac sarcoidosis, however, is isolated to the heart.  No skin lesions and exam by eye doctor was unremarkable per her report.  No pulmonary symptoms, CT chest in 9/22 did not show signs of sarcoidosis.   We do not have a tissue diagnosis, but endomyocardial biopsy has a relatively low sensitivity in cardiac sarcoidosis.  Given high probability CS, we have treated her based  on the data that we have. She is now off prednisone  and on goal dose MTX.  cPET in 3/23 showed EF 59%, several areas of hypermetabolic activity suggestive of active inflammatory process in the LV, similar to prior cPET. Echo in 8/23 showed EF 55%, basal septal hypokinesis (consistent with septal site for LGE on cMRI).  Most recent cPET in 4/24 showed significant improvement, EF 56%, no abnormal metabolism (no evidence for active inflammation).  Currently, no dyspnea, cough, or chest pain.  CBC in 11/24 showed leukopenia and anemia, methotrexate  has been decreased to 10 mg qwk. - Repeat CBC today.  If WBCs are still not back to normal, will probably need to stop methotrexate .  I will get a repeat cardiac PET to make sure she is still in remission. If WBCs are low and cardiac PET shows inflammation, will need her to see Dr. Mai to discuss transition to Humira.  - Continue folate.  - She will need q 3 month LFTs with MTX at this point (check today and again in 3 months).   - Continue Coreg  3.125 mg bid.   - Continue Lasix  20 mg daily with KCl 10 daily.  BMET  today.  - I will arrange for repeat echo.  3. Pulmonary nodule: Stable nodule on 5/23 CT.  - Repeat CT chest w/o contrast in 2 yrs (5/25).  4. Hyperlipidemia: Check lipids today.   Followup in 3 months.   I spent 31 minutes reviewing records, interviewing/examining patient, and managing orders.   Ezra Shuck 08/18/2023

## 2023-08-18 NOTE — Patient Instructions (Addendum)
 Medication Changes:  Continue taking medications the same way  Lab Work:  Labs done today, your results will be available in MyChart, we will contact you for abnormal readings.   Testing/Procedures:  Your physician has requested that you have an echocardiogram. Echocardiography is a painless test that uses sound waves to create images of your heart. It provides your doctor with information about the size and shape of your heart and how well your heart's chambers and valves are working. This procedure takes approximately one hour. There are no restrictions for this procedure. Please do NOT wear cologne, perfume, aftershave, or lotions (deodorant is allowed). Please arrive 15 minutes prior to your appointment time.  Please note: We ask at that you not bring children with you during ultrasound (echo/ vascular) testing. Due to room size and safety concerns, children are not allowed in the ultrasound rooms during exams. Our front office staff cannot provide observation of children in our lobby area while testing is being conducted. An adult accompanying a patient to their appointment will only be allowed in the ultrasound room at the discretion of the ultrasound technician under special circumstances. We apologize for any inconvenience. Echo scheduled for Friday, 09/10/2023 at 9 a.m. Please arrive to [facility] and check in at registration desk by 8:45 a.m.   Cardiac Sarcoidosis/Inflammation PET Scan Patient Instructions  Please report to Radiology at the [facility] Main Entrance 15 minutes early for your test.  [address] [place], [place] [address] BRING FOOD DIARY WITH YOU TO THIS APPOINTMENT For 24 hours before the test: Do not exercise! Do not eat after 5 pm the day before your test! To make sure that your scanning results are accurate, you MUST follow the sarcoid prep meal diet starting the day before your PET scan. This diet involves eating no carbohydrates 24 hours  before the test.  You will keep a log of all that you eat the day before your test. If you have questions or do not understand this diet, please call [phone] for more information. If you are unable to follow this diet, please discuss an alternative strategy with the coordinator.  If you are diabetic, continue your diabetes medications as usual on the day before until you begin to fast. NO DIABETES MEDICATIONS ONCE YOU BEGIN TO FAST. What foods can I eat the day before my test?  Drink only water  or black coffee (WITHOUT sugar, artificial sweetener, cream, or milk). Eggs (prepared without milk or cheese)  Meat that is either broiled or pan fried in butter WITHOUT breading (chicken, turkey, bacon, meat-only sausage, hamburger, steak, fish) Butter, salt & pepper What foods must I AVOID the day before my test?  Do not consume alcoholic beverages, sodas, fruit juice, coffee creamer, or sports drinks  Do not eat vegetables, beans, nuts, fruits, juices, bread, grains, rice, pasta, potatoes, or any baked goods Do not eat dairy products (milk, cheese, etc.)  Do not eat mayonnaise, ketchup, tartar sauce, mustard, or other condiments Do not add sugar, artificial sweeteners, or Splenda (sucralose) to foods or drinks  Do not eat breaded foods (like fried chicken)  Do not eat sweets, candy, gum, sweetened cough drops, lozenges, or sugar  Do not eat sweetened, grilled, or cured meats or meat with carbohydrate-containing additives (some sausages, ham, sweetened bacon) Suggested items for breakfast, lunch, or dinner:  Breakfast  3 to 5 fatty sausage links fried in butter. 3 to 5 bacon strips.  3 eggs pan fried in  butter (no milk or cheese).  Lunch/Dinner  2 hamburger patties fried in butter. Chicken or fatty fish pan fried in butter. No breading. 8 oz. fatty steak pan fried in butter.  Beverages  Drink only water  or black coffee. DO NOT ADD SUGAR, ARTIFICIAL SWEETENER, CREAM, OR MILK  Please call  [facility] to cancel or reschedule your appointment at [phone] For more information and frequently asked questions, please visit our website : [email or web address]   Cardiac Sarcoidosis/Inflammation PET Scan  Food Diary Name: _____________________________ Please fill in EXACTLY what you have eaten and when for 24 hours PRIOR to your test date.  Time Food/Drink Comments  Breakfast                Lunch                Dinner                Snacks                 DO NOT EXERCISE THE DAY BEFORE YOUR TEST DO NOT EAT AFTER 5 PM THE DAY BEFORE YOUR TEST.  ON THE DAY OF YOUR TEST, DO NOT EAT ANY FOOD AND ONLY DRINK CLEAR WATER ! PLEASE BRING THIS FOOD DIARY WITH YOU TO YOUR APPOINTMENT      Special Instructions // Education:  Do the following things EVERYDAY: Weigh yourself in the morning before breakfast. Write it down and keep it in a log. Take your medicines as prescribed Eat low salt foods--Limit salt (sodium) to 2000 mg per day.  Stay as active as you can everyday Limit all fluids for the day to less than 2 liters   Follow-Up in: 3 months with Dr. Mclean. Please call the clinic to schedule this appointment in March.     If you have any questions or concerns before your next appointment please send us  a message through [facility] or call our office at [phone] Monday-Friday 8 am-5 pm.   If you have an urgent need after hours on the weekend please call your Primary Cardiologist or the [facility] in [place] at [phone].   At the [facility], you and your health needs are our priority. We have a designated team specialized in the treatment of Heart Failure. This Care Team includes your primary Heart Failure Specialized Cardiologist (physician), Advanced Practice Providers (APPs- Physician Assistants and Nurse Practitioners), and Pharmacist who all work together to provide you with the care you  need, when you need it.   You may see any of the following providers on your designated Care Team at your next follow up:  Dr. Toribio Fuel Dr. Ezra Shuck Dr. Ria Commander Dr. Odis Brownie Greig Mosses, NP Caffie Shed, [place] [address] [place], [place] Beckey Coe, NP Jordan Lee, NP Ellouise Class, NP Jaun Bash, PharmD

## 2023-08-19 LAB — COMPREHENSIVE METABOLIC PANEL
ALT: 11 [IU]/L (ref 0–32)
AST: 21 [IU]/L (ref 0–40)
Albumin: 4.7 g/dL (ref 3.8–4.9)
Alkaline Phosphatase: 63 [IU]/L (ref 44–121)
BUN/Creatinine Ratio: 11 (ref 9–23)
BUN: 13 mg/dL (ref 6–24)
Bilirubin Total: 0.3 mg/dL (ref 0.0–1.2)
CO2: 25 mmol/L (ref 20–29)
Calcium: 9.7 mg/dL (ref 8.7–10.2)
Chloride: 101 mmol/L (ref 96–106)
Creatinine, Ser: 1.14 mg/dL — ABNORMAL HIGH (ref 0.57–1.00)
Globulin, Total: 2 g/dL (ref 1.5–4.5)
Glucose: 91 mg/dL (ref 70–99)
Potassium: 4.1 mmol/L (ref 3.5–5.2)
Sodium: 141 mmol/L (ref 134–144)
Total Protein: 6.7 g/dL (ref 6.0–8.5)
eGFR: 55 mL/min/{1.73_m2} — ABNORMAL LOW (ref >59)

## 2023-08-19 LAB — LIPID PANEL
Chol/HDL Ratio: 3.6 {ratio} (ref 0.0–4.4)
Cholesterol, Total: 222 mg/dL — ABNORMAL HIGH (ref 100–199)
HDL: 61 mg/dL (ref >39)
LDL Chol Calc (NIH): 144 mg/dL — ABNORMAL HIGH (ref 0–99)
Triglycerides: 94 mg/dL (ref 0–149)
VLDL Cholesterol Cal: 17 mg/dL (ref 5–40)

## 2023-08-19 LAB — CBC
Hematocrit: 39.7 % (ref 34.0–46.6)
Hemoglobin: 12.8 g/dL (ref 11.1–15.9)
MCH: 28.1 pg (ref 26.6–33.0)
MCHC: 32.2 g/dL (ref 31.5–35.7)
MCV: 87 fL (ref 79–97)
Platelets: 262 10*3/uL (ref 150–450)
RBC: 4.55 x10E6/uL (ref 3.77–5.28)
RDW: 13.4 % (ref 11.7–15.4)
WBC: 3.1 10*3/uL — ABNORMAL LOW (ref 3.4–10.8)

## 2023-08-19 NOTE — Progress Notes (Signed)
 The methotrexate  can just be stopped. No need to titrate down since the goal is to discontinue altogether.

## 2023-08-20 ENCOUNTER — Telehealth: Payer: Self-pay

## 2023-08-20 DIAGNOSIS — D869 Sarcoidosis, unspecified: Secondary | ICD-10-CM

## 2023-08-20 MED ORDER — METHOTREXATE SODIUM 2.5 MG PO TABS: 5.0000 mg | ORAL_TABLET | ORAL | 3 refills | Status: DC

## 2023-08-20 NOTE — Telephone Encounter (Addendum)
 Spoke with patient and Dr. Rolan, plan is to reduce Methotrexate  to 5 mg and repeat CBC in 2 weeks  ----- Message from Ezra Rolan sent at 08/19/2023  2:43 PM EST ----- Have her stop methotrexate , CBC in 3 weeks.  Need to make sure cardiac PET for sarcoidosis has been ordered. ----- Message ----- From: Josue Tinnie HERO, RPH-CPP Sent: 08/19/2023   1:07 PM EST To: Ezra GORMAN Rolan, MD  The methotrexate  can just be stopped. No need to titrate down since the goal is to discontinue altogether.

## 2023-09-01 ENCOUNTER — Ambulatory Visit (INDEPENDENT_AMBULATORY_CARE_PROVIDER_SITE_OTHER): Payer: BC Managed Care – PPO

## 2023-09-01 DIAGNOSIS — I442 Atrioventricular block, complete: Secondary | ICD-10-CM | POA: Diagnosis not present

## 2023-09-02 LAB — CUP PACEART REMOTE DEVICE CHECK
Battery Remaining Longevity: 56 mo
Battery Remaining Percentage: 62 %
Battery Voltage: 2.98 V
Brady Statistic AP VP Percent: 23 %
Brady Statistic AP VS Percent: 1 %
Brady Statistic AS VP Percent: 76 %
Brady Statistic AS VS Percent: 1 %
Brady Statistic RA Percent Paced: 23 %
Date Time Interrogation Session: 20250219020020
HighPow Impedance: 72 Ohm
HighPow Impedance: 72 Ohm
Lead Channel Impedance Value: 410 Ohm
Lead Channel Impedance Value: 460 Ohm
Lead Channel Impedance Value: 540 Ohm
Lead Channel Pacing Threshold Amplitude: 0.25 V
Lead Channel Pacing Threshold Amplitude: 0.5 V
Lead Channel Pacing Threshold Amplitude: 1.5 V
Lead Channel Pacing Threshold Pulse Width: 0.5 ms
Lead Channel Pacing Threshold Pulse Width: 0.5 ms
Lead Channel Pacing Threshold Pulse Width: 0.5 ms
Lead Channel Sensing Intrinsic Amplitude: 12 mV
Lead Channel Sensing Intrinsic Amplitude: 3.6 mV
Lead Channel Setting Pacing Amplitude: 0.25 V
Lead Channel Setting Pacing Amplitude: 1.5 V
Lead Channel Setting Pacing Amplitude: 2.5 V
Lead Channel Setting Pacing Pulse Width: 0.05 ms
Lead Channel Setting Pacing Pulse Width: 0.5 ms
Lead Channel Setting Sensing Sensitivity: 0.5 mV
Pulse Gen Serial Number: 9956643
Zone Setting Status: 755011

## 2023-09-03 ENCOUNTER — Other Ambulatory Visit
Admission: RE | Admit: 2023-09-03 | Discharge: 2023-09-03 | Disposition: A | Discharge status: Home / Self Care | Payer: BC Managed Care – PPO | Attending: Cardiology | Admitting: Cardiology

## 2023-09-03 ENCOUNTER — Telehealth: Payer: Self-pay | Admitting: *Deleted

## 2023-09-03 DIAGNOSIS — D869 Sarcoidosis, unspecified: Secondary | ICD-10-CM | POA: Insufficient documentation

## 2023-09-03 LAB — CBC
HCT: 38.8 % (ref 36.0–46.0)
Hemoglobin: 12.7 g/dL (ref 12.0–15.0)
MCH: 27.9 pg (ref 26.0–34.0)
MCHC: 32.7 g/dL (ref 30.0–36.0)
MCV: 85.1 fL (ref 80.0–100.0)
Platelets: 236 10*3/uL (ref 150–400)
RBC: 4.56 MIL/uL (ref 3.87–5.11)
RDW: 13.4 % (ref 11.5–15.5)
WBC: 2.8 10*3/uL — ABNORMAL LOW (ref 4.0–10.5)
nRBC: 0 % (ref 0.0–0.2)

## 2023-09-03 NOTE — Telephone Encounter (Signed)
-----   Message from Nurse Ethel Rana sent at 09/03/2023  3:55 PM EST -----  ----- Message ----- From: Laurey Morale, MD Sent: 09/03/2023   3:46 PM EST To: Evon Slack, RPH-CPP; [facility] Triage Pool  WBCs still low.  Make sure she stops methotrexate at this point.  She needs a followup with her rheumatologist.  She has a cardiac PET scheduled, please see if we can arrange this a month sooner (April).

## 2023-09-03 NOTE — Telephone Encounter (Signed)
 Pt aware and agreeable, she is nervous to stop methotrexate but understands reason and will stop, she is sch for f/u with Dr Dierdre Forth on 3/5, copy of labs sent to him. Pt states due to insurance approval she can not have cPET done before 4/22, that's why it's sch for 5/7, mess to [facility] pet dept if can move up between 4/23 and 5/7 they will call her.

## 2023-09-10 ENCOUNTER — Ambulatory Visit
Admission: RE | Admit: 2023-09-10 | Discharge: 2023-09-10 | Disposition: A | Discharge status: Home / Self Care | Payer: BC Managed Care – PPO | Source: Ambulatory Visit | Attending: Cardiology | Admitting: Cardiology

## 2023-09-10 DIAGNOSIS — I071 Rheumatic tricuspid insufficiency: Secondary | ICD-10-CM | POA: Diagnosis not present

## 2023-09-10 DIAGNOSIS — I5022 Chronic systolic (congestive) heart failure: Secondary | ICD-10-CM | POA: Insufficient documentation

## 2023-09-10 LAB — ECHOCARDIOGRAM COMPLETE
AR max vel: 1.75 cm2
AV Area VTI: 2.12 cm2
AV Area mean vel: 1.92 cm2
AV Mean grad: 3.3 mmHg
AV Peak grad: 5.3 mmHg
Ao pk vel: 1.15 m/s
Area-P 1/2: 3 cm2
MV VTI: 2.56 cm2
S' Lateral: 3.2 cm

## 2023-09-10 NOTE — Progress Notes (Signed)
*  PRELIMINARY RESULTS* Echocardiogram 2D Echocardiogram has been performed.  Cristela Blue 09/10/2023, 9:53 AM

## 2023-09-15 DIAGNOSIS — D8685 Sarcoid myocarditis: Secondary | ICD-10-CM | POA: Diagnosis not present

## 2023-09-15 DIAGNOSIS — Z7952 Long term (current) use of systemic steroids: Secondary | ICD-10-CM | POA: Diagnosis not present

## 2023-09-15 DIAGNOSIS — D72818 Other decreased white blood cell count: Secondary | ICD-10-CM | POA: Diagnosis not present

## 2023-09-15 DIAGNOSIS — Z79899 Other long term (current) drug therapy: Secondary | ICD-10-CM | POA: Diagnosis not present

## 2023-09-28 ENCOUNTER — Encounter: Payer: Self-pay | Admitting: Internal Medicine

## 2023-10-06 NOTE — Addendum Note (Signed)
 Addended by: Elease Etienne A on: 10/06/2023 02:15 PM   Modules accepted: Orders

## 2023-10-06 NOTE — Progress Notes (Signed)
 Remote ICD transmission.

## 2023-10-20 DIAGNOSIS — D72818 Other decreased white blood cell count: Secondary | ICD-10-CM | POA: Diagnosis not present

## 2023-10-21 ENCOUNTER — Telehealth: Payer: Self-pay | Admitting: Cardiology

## 2023-10-22 ENCOUNTER — Encounter (HOSPITAL_COMMUNITY): Payer: Self-pay

## 2023-10-25 ENCOUNTER — Other Ambulatory Visit (HOSPITAL_COMMUNITY): Payer: Self-pay | Admitting: Cardiology

## 2023-11-17 ENCOUNTER — Encounter (HOSPITAL_COMMUNITY)
Admission: RE | Admit: 2023-11-17 | Discharge: 2023-11-17 | Disposition: A | Discharge status: Home / Self Care | Payer: BC Managed Care – PPO | Source: Ambulatory Visit | Attending: Cardiology | Admitting: Cardiology

## 2023-11-17 DIAGNOSIS — I700 Atherosclerosis of aorta: Secondary | ICD-10-CM | POA: Diagnosis not present

## 2023-11-17 DIAGNOSIS — I5022 Chronic systolic (congestive) heart failure: Secondary | ICD-10-CM

## 2023-11-17 DIAGNOSIS — D869 Sarcoidosis, unspecified: Secondary | ICD-10-CM | POA: Diagnosis not present

## 2023-11-17 LAB — NM PET CT MYOCARDIAL SARCOIDOSIS
Nuc Stress EF: 50 %
Rest Nuclear Isotope Dose: 18.9 mCi

## 2023-11-17 MED ORDER — RUBIDIUM RB82 GENERATOR (RUBYFILL)
18.9200 | PACK | Freq: Once | INTRAVENOUS | Status: AC
  Administered 2023-11-17: 18.92 via INTRAVENOUS

## 2023-11-17 MED ORDER — FLUDEOXYGLUCOSE F - 18 (FDG) INJECTION
9.1400 | Freq: Once | INTRAVENOUS | Status: AC
  Administered 2023-11-17: 9.14 via INTRAVENOUS

## 2023-12-01 ENCOUNTER — Ambulatory Visit (INDEPENDENT_AMBULATORY_CARE_PROVIDER_SITE_OTHER): Payer: BC Managed Care – PPO

## 2023-12-01 DIAGNOSIS — I442 Atrioventricular block, complete: Secondary | ICD-10-CM | POA: Diagnosis not present

## 2023-12-01 LAB — CUP PACEART REMOTE DEVICE CHECK
Battery Remaining Longevity: 55 mo
Battery Remaining Percentage: 60 %
Battery Voltage: 2.98 V
Brady Statistic AP VP Percent: 22 %
Brady Statistic AP VS Percent: 1 %
Brady Statistic AS VP Percent: 78 %
Brady Statistic AS VS Percent: 1 %
Brady Statistic RA Percent Paced: 21 %
Date Time Interrogation Session: 20250521033608
HighPow Impedance: 70 Ohm
HighPow Impedance: 70 Ohm
Lead Channel Impedance Value: 410 Ohm
Lead Channel Impedance Value: 460 Ohm
Lead Channel Impedance Value: 510 Ohm
Lead Channel Pacing Threshold Amplitude: 0.25 V
Lead Channel Pacing Threshold Amplitude: 0.625 V
Lead Channel Pacing Threshold Amplitude: 1.5 V
Lead Channel Pacing Threshold Pulse Width: 0.5 ms
Lead Channel Pacing Threshold Pulse Width: 0.5 ms
Lead Channel Pacing Threshold Pulse Width: 0.5 ms
Lead Channel Sensing Intrinsic Amplitude: 12 mV
Lead Channel Sensing Intrinsic Amplitude: 3.4 mV
Lead Channel Setting Pacing Amplitude: 0.25 V
Lead Channel Setting Pacing Amplitude: 1.625
Lead Channel Setting Pacing Amplitude: 2.5 V
Lead Channel Setting Pacing Pulse Width: 0.05 ms
Lead Channel Setting Pacing Pulse Width: 0.5 ms
Lead Channel Setting Sensing Sensitivity: 0.5 mV
Pulse Gen Serial Number: 9956643
Zone Setting Status: 755011

## 2023-12-02 ENCOUNTER — Ambulatory Visit: Payer: Self-pay | Admitting: Cardiology

## 2023-12-03 ENCOUNTER — Other Ambulatory Visit (HOSPITAL_COMMUNITY): Payer: Self-pay | Admitting: Cardiology

## 2023-12-03 ENCOUNTER — Telehealth: Payer: Self-pay | Admitting: Cardiology

## 2023-12-03 NOTE — Telephone Encounter (Signed)
 Called to confirm/remind patient of their appointment at [facility] on 12/07/23.   Appointment:   [x] Confirmed  [] Left mess   [] No answer/No voice mail  [] VM Full/unable to leave message  [] Phone not in service  Patient reminded to bring all medications and/or complete list.  Confirmed patient has transportation. Gave directions, instructed to utilize valet parking.

## 2023-12-07 ENCOUNTER — Ambulatory Visit: Attending: Cardiology | Admitting: Cardiology

## 2023-12-07 ENCOUNTER — Encounter: Payer: Self-pay | Admitting: Cardiology

## 2023-12-07 VITALS — BP 132/89 | HR 66 | Wt 164.0 lb

## 2023-12-07 DIAGNOSIS — E785 Hyperlipidemia, unspecified: Secondary | ICD-10-CM | POA: Diagnosis not present

## 2023-12-07 DIAGNOSIS — I442 Atrioventricular block, complete: Secondary | ICD-10-CM | POA: Diagnosis not present

## 2023-12-07 DIAGNOSIS — I5022 Chronic systolic (congestive) heart failure: Secondary | ICD-10-CM | POA: Diagnosis not present

## 2023-12-07 DIAGNOSIS — Z95810 Presence of automatic (implantable) cardiac defibrillator: Secondary | ICD-10-CM | POA: Diagnosis not present

## 2023-12-07 DIAGNOSIS — Z79899 Other long term (current) drug therapy: Secondary | ICD-10-CM | POA: Diagnosis not present

## 2023-12-07 DIAGNOSIS — Z566 Other physical and mental strain related to work: Secondary | ICD-10-CM | POA: Diagnosis not present

## 2023-12-07 DIAGNOSIS — D8685 Sarcoid myocarditis: Secondary | ICD-10-CM | POA: Diagnosis not present

## 2023-12-07 MED ORDER — ROSUVASTATIN CALCIUM 10 MG PO TABS
10.0000 mg | ORAL_TABLET | Freq: Every day | ORAL | 3 refills | Status: AC
Start: 2023-12-07 — End: 2025-05-23

## 2023-12-07 NOTE — Patient Instructions (Signed)
 Medication Changes:  BEGIN taking Rosuvastatin (Crestor) 10 MG daily  Continue taking all other medications the same.  Lab Work:  Please go to [facility] office on the lower level of this building for lab work today.  Your results will be available in MyChart, we will contact you for abnormal readings.   Please return for additional labwork in 2 months and 3 months, respectively. For these labs, please check in at the [facility] main entrance.   Special Instructions // Education:  Do the following things EVERYDAY: Weigh yourself in the morning before breakfast. Write it down and keep it in a log. Take your medicines as prescribed Eat low salt foods--Limit salt (sodium) to 2000 mg per day.  Stay as active as you can everyday Limit all fluids for the day to less than 2 liters   Follow-Up in: 6 months with Dr. Mclean. Please call our office in 4 months to schedule this appointment.     If you have any questions or concerns before your next appointment please send us  a message through [place] or call our office at [phone], If it is after office hours your call will be answered by our answering service and directed appropriately.     At the [facility], you and your health needs are our priority. We have a designated team specialized in the treatment of Heart Failure. This Care Team includes your primary Heart Failure Specialized Cardiologist (physician), Advanced Practice Providers (APPs- Physician Assistants and Nurse Practitioners), and Pharmacist who all work together to provide you with the care you need, when you need it.   You may see any of the following providers on your designated Care Team at your next follow up:  Dr. Jules Oar Dr. Peder Bourdon Dr. Alwin Baars Dr. Judyth Nunnery Nieves Bars, NP Ruddy Corral, [place] [address] [place], [place] Dennise Fitz, NP [place] Lee, NP [place] Dellen, NP Bevely Brush, PharmD

## 2023-12-07 NOTE — Progress Notes (Signed)
 PCP: Austine Lefort, MD EP: Dr. Rodolfo Clan HF cardiology: Dr. Mitzie Anda  Chief complaint: Cardiac sarcoidosis  58 y.o. with history of complete heart block s/p St Jude BiV ICD placement and suspected cardiac sarcoidosis was referred by Dr. Rodolfo Clan for management of cardiac sarcoidosis. Patient generally has been in good health, no major medical problems until 5/22.  In 5/22, she developed "dizzy spells."  She would check her pulse and find it to be in the 30s.  She did not pass out.  She had an ECG at her PCP's office showing complete heart block and was admitted.  Echo showed normal LV systolic function.  Cardiac MRI was done due to concern for possible cardiac sarcoidosis as cause of CHB in a relatively young woman.  Cardiac MRI showed a mid-wall septal LGE pattern with elevated septal T2 signal that was concerning for cardiac sarcoidosis.  Patient had implantation of St Jude CRT-D device.  She then had a cardiac PET at [facility] which showed inflammation in the septum and the RV free wall along with a septal perfusion defect.  She was seen by Dr. Rodolfo Clan and was started on prednisone  and methotrexate  for treatment of high probability cardiac sarcoidosis.   CT chest in 9/22 did not show signs of pulmonary sarcoidosis.   Repeat cardiac PET in 3/23 showed EF 59%, several areas of hypermetabolic activity suggestive of active inflammatory process in the LV, similar to prior cPET.   Echo in 8/23 showed EF 55%, basal septal hypokinesis, mildly decreased RV systolic function.  Cardiac PET in 4/24 showed EF 56%, no abnormal metabolism (no evidence for active inflammation).   In 11/24, patient developed fatigue, cough congestion.  Suspect viral illness, but CBC was done and new leukopenia and anemia were noted.  Given concern that this could be triggered by methotrexate , the dose of methotrexate  was decreased to 10 mg qwk. With ongoing leukopenia, methotrexate  was stopped.  She saw her rheumatologist and CBC was normal,  he restarted the methotrexate  at 10 mg qwk.   Cardiac PET in 5/25 showed EF 50%, no active inflammation.  Echo in 2/25 showed EF 50-55%, normal RV function.   Patient returns for followup of cardiac sarcoidosis.  She is now back on 10 mg methotrexate  weekly. She is doing well symptomatically, no exertional dyspnea or chest pain.  No palpitations/lightheadedness.  She is under a lot of stress at work right now.   St Jude device interrogation: >99% BiV pacing, stable impedance with no AF/VT.    Labs (5/22): K 4, creatinine 0.98 Labs (11/22): K 4.7, creatinine 1.18, LFTs normal Labs (12/22): hgb 13.2, K 4.2, creatinine 1.2 Labs (2/23): LFTs normal, K 4, creatinine 1.08 Labs (7/23): K 3.9, creatinine 0.93, LFTs normal, hgb 12.3 Labs (8/23): ACE level 82 (upper normal), hgb 12.5, K 4.1, creatinine 0.92, LFTs normal Labs (1/24): LDL 135, HDL 55 Labs (11/24): K 4.1, creatinine 1.06, LFTs normal, hgb 11.2, WBC 2.6 Labs (1/25): hgb 12.4, WBCs 3.6 Labs (2/25): hgb 12.7, WBCs 2.8, LDL 144 Labs (3/25): hgb 13.2, WBCs 3.8  PMH: 1. H/o Shingles 2. Ovarian cyst 3. Complete heart block: Thought to be due to cardiac sarcoidosis, s/p placement of St Jude CRT-D device.  4. Cardiac sarcoidosis: Strong suspicion.  Patient presented in 5/22 with complete heart block.  - Echo (5/22): EF 60-65%, normal RV, PASP 36 mmHg.  - Cardiac MRI (5/22): LV EF 64%, increased T2 signal in septum, mid-wall septal LGE (non-MI pattern), RV EF 61%.  - Cardiac PET (  7/22): LV EF 57%, inflammation in 57% of the septal wall and 17% of the total LV, inflammation also involved the RV free wall.  There was a septal perfusion defect. This was concerning for possible cardiac sarcoidosis.  - High resolution CT chest (9/22): No signs of sarcoidosis, small RUL nodule.  - Cardiac PET (3/23): EF 59%, several areas of hypermetabolic activity suggestive of active inflammatory process in the LV, similar to prior cPET.  - Echo (8/23): EF 55%,  basal septal hypokinesis, mildly decreased RV systolic function. - Cardiac PET (4/24): EF 56%, no abnormal metabolism (no evidence for active inflammation). - Echo (2/25): EF 50-55%, normal RV function.  - Cardiac PET (5/25): EF 50%, no active inflammation.  5. Lung nodule: following with serial CTs 6. Calcium score scan (2/24): calcium score 0   SH: Single, [place] from [place] originally, now lives in [place], nonsmoker, no ETOH/drugs, working full time.   FH: No family history of sarcoidosis or cardiomyopathy.   ROS: All systems reviewed and negative except as per HPI.   Current Outpatient Medications  Medication Sig Dispense Refill   BIOTIN  MAXIMUM PO Take 10,000 mcg by mouth daily.     carvedilol  (COREG ) 3.125 MG tablet TAKE ONE TABLET BY MOUTH TWICE A DAY 180 tablet 3   Cholecalciferol  (VITAMIN D3) 25 MCG (1000 UT) CAPS Take 1 capsule by mouth daily.     folic acid  (FOLVITE ) 1 MG tablet Take 2 mg by mouth daily.     furosemide  (LASIX ) 20 MG tablet TAKE ONE TABLET BY MOUTH ONCE A DAY 90 tablet 3   methotrexate  (RHEUMATREX) 2.5 MG tablet Take 10 mg by mouth once a week.     Multiple Vitamins-Minerals (VITAMIN D3 COMPLETE PO) Take 1 tablet by mouth daily.     potassium chloride  (KLOR-CON ) 10 MEQ tablet TAKE ONE TABLET BY MOUTH ONCE A DAY 90 tablet 3   rosuvastatin (CRESTOR) 10 MG tablet Take 1 tablet (10 mg total) by mouth daily. 90 tablet 3   venlafaxine  (EFFEXOR ) 37.5 MG tablet Take 37.5 mg by mouth daily.     vitamin B-12 (CYANOCOBALAMIN ) 500 MCG tablet Take 1 tablet (500 mcg total) by mouth every morning.     vitamin E 180 MG (400 UNITS) capsule Take 400 Units by mouth daily.     No current facility-administered medications for this visit.   BP 132/89 (BP Location: Right Arm, Patient Position: Sitting, Cuff Size: Normal)   Pulse 66   Wt 164 lb (74.4 kg)   SpO2 100%   BMI 27.29 kg/m  General: NAD Neck: No JVD, no thyromegaly or thyroid  nodule.  Lungs: Clear to  auscultation bilaterally with normal respiratory effort. CV: Nondisplaced PMI.  Heart regular S1/S2, no S3/S4, no murmur.  No peripheral edema.  No carotid bruit.  Normal pedal pulses.  Abdomen: Soft, nontender, no hepatosplenomegaly, no distention.  Skin: Intact without lesions or rashes.  Neurologic: Alert and oriented x 3.  Psych: Normal affect. Extremities: No clubbing or cyanosis.  HEENT: Normal.   Assessment/Plan: 1. Complete heart block: In a young woman, there was concern for cardiac sarcoidosis as the cause.  Workup with cMRI and cPET was highly concerning for cardiac sarcoidosis.  She now has a Secondary school teacher CRT-D device.   - Dr. Rodolfo Clan follows CRT-D device, functioning normally.  2. Cardiac sarcoidosis: Strong suspicion for cardiac sarcoidosis in a relatively young woman with complete heart block and both cMRI and cPET that are consistent with cardiac sarcoidosis.  She  does not have evidence for extracardiac sarcoidosis; 25% of cardiac sarcoidosis, however, is isolated to the heart.  No skin lesions and exam by eye doctor was unremarkable per her report.  No pulmonary symptoms, CT chest in 9/22 did not show signs of sarcoidosis.   We do not have a tissue diagnosis, but endomyocardial biopsy has a relatively low sensitivity in cardiac sarcoidosis.  Given high probability CS, we have treated her based on the data that we have. She is now off prednisone  and on goal dose MTX.  cPET in 3/23 showed EF 59%, several areas of hypermetabolic activity suggestive of active inflammatory process in the LV, similar to prior cPET. Echo in 8/23 showed EF 55%, basal septal hypokinesis (consistent with septal site for LGE on cMRI).  cPET in 4/24 showed significant improvement, EF 56%, no abnormal metabolism (no evidence for active inflammation).  cPET in 5/25 similarly showed EF 50% with no active inflammation and echo in 2/25 showed EF 50-55%.  Currently, no dyspnea, cough, or chest pain.  CBC in 11/24 showed  leukopenia and anemia, methotrexate  was decreased to 10 mg qwk then stopped.  Her rheumatologist restarted it in 3/25 at 10 mg/wk in 3/25 when WBC count returned to normal range.  - Repeat CBC today.  If WBCs are low again, will need to stop methotrexate .  In that case, would wait a few months then repeat a cardiac PET to see if there is any active inflammation (could then use mycophenolate or Humira).  - Continue folate.  - She will need q 3 month LFTs with MTX at this point (check today and again in 3 months).   - Continue Coreg  3.125 mg bid.   - Continue Lasix  20 mg daily with KCl 10 daily.  BMET today.  3. Pulmonary nodule: Not noted no PET in 5/25.  4. Hyperlipidemia: With aortic atherosclerosis noted on imaging, will start Crestor 10 mg daily with lipids/LFTs in 2 months.   Followup in 6 months, but will check CBC and CMET in 3 months.   I spent 32 minutes reviewing records, interviewing/examining patient, and managing orders.   Peder Bourdon 12/07/2023

## 2023-12-08 ENCOUNTER — Ambulatory Visit (HOSPITAL_COMMUNITY): Payer: Self-pay | Admitting: Cardiology

## 2023-12-08 DIAGNOSIS — I5022 Chronic systolic (congestive) heart failure: Secondary | ICD-10-CM

## 2023-12-08 LAB — CBC
Hematocrit: 41.1 % (ref 34.0–46.6)
Hemoglobin: 12.7 g/dL (ref 11.1–15.9)
MCH: 26.8 pg (ref 26.6–33.0)
MCHC: 30.9 g/dL — ABNORMAL LOW (ref 31.5–35.7)
MCV: 87 fL (ref 79–97)
Platelets: 252 10*3/uL (ref 150–450)
RBC: 4.73 x10E6/uL (ref 3.77–5.28)
RDW: 13.1 % (ref 11.7–15.4)
WBC: 3.4 10*3/uL (ref 3.4–10.8)

## 2023-12-08 LAB — COMPREHENSIVE METABOLIC PANEL WITH GFR
ALT: 13 IU/L (ref 0–32)
AST: 23 IU/L (ref 0–40)
Albumin: 4.4 g/dL (ref 3.8–4.9)
Alkaline Phosphatase: 63 IU/L (ref 44–121)
BUN/Creatinine Ratio: 12 (ref 12–28)
BUN: 11 mg/dL (ref 8–27)
Bilirubin Total: 0.3 mg/dL (ref 0.0–1.2)
CO2: 24 mmol/L (ref 20–29)
Calcium: 9.4 mg/dL (ref 8.7–10.3)
Chloride: 104 mmol/L (ref 96–106)
Creatinine, Ser: 0.89 mg/dL (ref 0.57–1.00)
Globulin, Total: 2 g/dL (ref 1.5–4.5)
Glucose: 86 mg/dL (ref 70–99)
Potassium: 4.2 mmol/L (ref 3.5–5.2)
Sodium: 143 mmol/L (ref 134–144)
Total Protein: 6.4 g/dL (ref 6.0–8.5)
eGFR: 74 mL/min/{1.73_m2} (ref >59)

## 2024-01-20 NOTE — Progress Notes (Signed)
 Remote ICD transmission.

## 2024-03-01 ENCOUNTER — Ambulatory Visit (INDEPENDENT_AMBULATORY_CARE_PROVIDER_SITE_OTHER): Payer: BC Managed Care – PPO

## 2024-03-01 DIAGNOSIS — I442 Atrioventricular block, complete: Secondary | ICD-10-CM

## 2024-03-02 LAB — CUP PACEART REMOTE DEVICE CHECK
Battery Remaining Longevity: 53 mo
Battery Remaining Percentage: 57 %
Battery Voltage: 2.96 V
Brady Statistic AP VP Percent: 22 %
Brady Statistic AP VS Percent: 1 %
Brady Statistic AS VP Percent: 78 %
Brady Statistic AS VS Percent: 1 %
Brady Statistic RA Percent Paced: 22 %
Date Time Interrogation Session: 20250820020020
HighPow Impedance: 69 Ohm
HighPow Impedance: 69 Ohm
Lead Channel Impedance Value: 410 Ohm
Lead Channel Impedance Value: 460 Ohm
Lead Channel Impedance Value: 510 Ohm
Lead Channel Pacing Threshold Amplitude: 0.25 V
Lead Channel Pacing Threshold Amplitude: 0.5 V
Lead Channel Pacing Threshold Amplitude: 1.5 V
Lead Channel Pacing Threshold Pulse Width: 0.5 ms
Lead Channel Pacing Threshold Pulse Width: 0.5 ms
Lead Channel Pacing Threshold Pulse Width: 0.5 ms
Lead Channel Sensing Intrinsic Amplitude: 12 mV
Lead Channel Sensing Intrinsic Amplitude: 3.5 mV
Lead Channel Setting Pacing Amplitude: 0.25 V
Lead Channel Setting Pacing Amplitude: 1.5 V
Lead Channel Setting Pacing Amplitude: 2.5 V
Lead Channel Setting Pacing Pulse Width: 0.05 ms
Lead Channel Setting Pacing Pulse Width: 0.5 ms
Lead Channel Setting Sensing Sensitivity: 0.5 mV
Pulse Gen Serial Number: 9956643
Zone Setting Status: 755011

## 2024-03-03 ENCOUNTER — Ambulatory Visit: Payer: Self-pay | Admitting: Cardiology

## 2024-03-08 ENCOUNTER — Other Ambulatory Visit
Admission: RE | Admit: 2024-03-08 | Discharge: 2024-03-08 | Disposition: A | Discharge status: Home / Self Care | Attending: Cardiology | Admitting: Cardiology

## 2024-03-08 DIAGNOSIS — I5022 Chronic systolic (congestive) heart failure: Secondary | ICD-10-CM | POA: Insufficient documentation

## 2024-03-08 DIAGNOSIS — E785 Hyperlipidemia, unspecified: Secondary | ICD-10-CM | POA: Diagnosis not present

## 2024-03-08 LAB — COMPREHENSIVE METABOLIC PANEL WITH GFR
ALT: 21 U/L (ref 0–44)
AST: 26 U/L (ref 15–41)
Albumin: 4.1 g/dL (ref 3.5–5.0)
Alkaline Phosphatase: 41 U/L (ref 38–126)
Anion gap: 10 (ref 5–15)
BUN: 11 mg/dL (ref 6–20)
CO2: 27 mmol/L (ref 22–32)
Calcium: 9.2 mg/dL (ref 8.9–10.3)
Chloride: 104 mmol/L (ref 98–111)
Creatinine, Ser: 1.02 mg/dL — ABNORMAL HIGH (ref 0.44–1.00)
GFR, Estimated: >60 mL/min (ref >60)
Glucose, Bld: 102 mg/dL — ABNORMAL HIGH (ref 70–99)
Potassium: 4.2 mmol/L (ref 3.5–5.1)
Sodium: 141 mmol/L (ref 135–145)
Total Bilirubin: 0.8 mg/dL (ref 0.0–1.2)
Total Protein: 6.6 g/dL (ref 6.5–8.1)

## 2024-03-08 LAB — CBC
HCT: 37.9 % (ref 36.0–46.0)
Hemoglobin: 12.1 g/dL (ref 12.0–15.0)
MCH: 27.4 pg (ref 26.0–34.0)
MCHC: 31.9 g/dL (ref 30.0–36.0)
MCV: 85.9 fL (ref 80.0–100.0)
Platelets: 219 K/uL (ref 150–400)
RBC: 4.41 MIL/uL (ref 3.87–5.11)
RDW: 14 % (ref 11.5–15.5)
WBC: 3.4 K/uL — ABNORMAL LOW (ref 4.0–10.5)
nRBC: 0 % (ref 0.0–0.2)

## 2024-03-08 LAB — LIPID PANEL
Cholesterol: 142 mg/dL (ref 0–200)
HDL: 60 mg/dL (ref >40)
LDL Cholesterol: 72 mg/dL (ref 0–99)
Total CHOL/HDL Ratio: 2.4 ratio
Triglycerides: 50 mg/dL (ref <150)
VLDL: 10 mg/dL (ref 0–40)

## 2024-03-08 NOTE — Progress Notes (Signed)
 From looking at outside labs, it appears her rheumatologist restarted the methotrexate  10 mg/week when WBC had improved to 3.8 (09/2023). Since that time, WBC has been 3.4-3.5 on multiple checks. Since WBC is stable at 3.4 today (and was 3.4 on last check 12/07/23), I think it is reasonable to continue the methotrexate  at the current dose. However, would recommend careful monitoring of CBC with low threshold to discontinue if WBC decreases again, especially as there was no active inflammation on last PET.

## 2024-03-15 DIAGNOSIS — Z7952 Long term (current) use of systemic steroids: Secondary | ICD-10-CM | POA: Diagnosis not present

## 2024-03-15 DIAGNOSIS — Z79899 Other long term (current) drug therapy: Secondary | ICD-10-CM | POA: Diagnosis not present

## 2024-03-15 DIAGNOSIS — D8685 Sarcoid myocarditis: Secondary | ICD-10-CM | POA: Diagnosis not present

## 2024-03-15 DIAGNOSIS — Z6826 Body mass index (BMI) 26.0-26.9, adult: Secondary | ICD-10-CM | POA: Diagnosis not present

## 2024-03-15 NOTE — Addendum Note (Signed)
 Addended by: SHARL GRATE A on: 03/15/2024 04:14 PM   Modules accepted: Orders

## 2024-03-15 NOTE — Telephone Encounter (Signed)
 Spoke to pt about continuing medication and monthly lab work. Advised that the pt should call in October in order to schedule her 6 month fu for November.

## 2024-04-05 NOTE — Progress Notes (Signed)
Remote ICD Transmission.

## 2024-04-07 ENCOUNTER — Other Ambulatory Visit
Admission: RE | Admit: 2024-04-07 | Discharge: 2024-04-07 | Disposition: A | Discharge status: Home / Self Care | Attending: Cardiology | Admitting: Cardiology

## 2024-04-07 DIAGNOSIS — I5022 Chronic systolic (congestive) heart failure: Secondary | ICD-10-CM | POA: Insufficient documentation

## 2024-04-07 LAB — CBC
HCT: 37.8 % (ref 36.0–46.0)
Hemoglobin: 11.8 g/dL — ABNORMAL LOW (ref 12.0–15.0)
MCH: 27.4 pg (ref 26.0–34.0)
MCHC: 31.2 g/dL (ref 30.0–36.0)
MCV: 87.7 fL (ref 80.0–100.0)
Platelets: 200 K/uL (ref 150–400)
RBC: 4.31 MIL/uL (ref 3.87–5.11)
RDW: 13.5 % (ref 11.5–15.5)
WBC: 2.4 K/uL — ABNORMAL LOW (ref 4.0–10.5)
nRBC: 0 % (ref 0.0–0.2)

## 2024-04-09 ENCOUNTER — Ambulatory Visit (HOSPITAL_COMMUNITY): Payer: Self-pay | Admitting: Cardiology

## 2024-04-09 DIAGNOSIS — I5022 Chronic systolic (congestive) heart failure: Secondary | ICD-10-CM

## 2024-04-09 DIAGNOSIS — D869 Sarcoidosis, unspecified: Secondary | ICD-10-CM

## 2024-04-18 DIAGNOSIS — Z1151 Encounter for screening for human papillomavirus (HPV): Secondary | ICD-10-CM | POA: Diagnosis not present

## 2024-04-18 DIAGNOSIS — Z124 Encounter for screening for malignant neoplasm of cervix: Secondary | ICD-10-CM | POA: Diagnosis not present

## 2024-04-18 DIAGNOSIS — Z6826 Body mass index (BMI) 26.0-26.9, adult: Secondary | ICD-10-CM | POA: Diagnosis not present

## 2024-04-18 DIAGNOSIS — Z01419 Encounter for gynecological examination (general) (routine) without abnormal findings: Secondary | ICD-10-CM | POA: Diagnosis not present

## 2024-05-02 ENCOUNTER — Other Ambulatory Visit
Admission: RE | Admit: 2024-05-02 | Discharge: 2024-05-02 | Disposition: A | Discharge status: Home / Self Care | Source: Ambulatory Visit | Attending: Cardiology | Admitting: Cardiology

## 2024-05-02 ENCOUNTER — Ambulatory Visit (HOSPITAL_COMMUNITY): Payer: Self-pay | Admitting: Cardiology

## 2024-05-02 DIAGNOSIS — D869 Sarcoidosis, unspecified: Secondary | ICD-10-CM

## 2024-05-02 DIAGNOSIS — I5022 Chronic systolic (congestive) heart failure: Secondary | ICD-10-CM | POA: Diagnosis not present

## 2024-05-02 LAB — BASIC METABOLIC PANEL WITH GFR
Anion gap: 9 (ref 5–15)
BUN: 16 mg/dL (ref 6–20)
CO2: 24 mmol/L (ref 22–32)
Calcium: 9.1 mg/dL (ref 8.9–10.3)
Chloride: 108 mmol/L (ref 98–111)
Creatinine, Ser: 0.83 mg/dL (ref 0.44–1.00)
GFR, Estimated: >60 mL/min (ref >60)
Glucose, Bld: 78 mg/dL (ref 70–99)
Potassium: 4.1 mmol/L (ref 3.5–5.1)
Sodium: 141 mmol/L (ref 135–145)

## 2024-05-02 LAB — CBC
HCT: 44.5 % (ref 36.0–46.0)
Hemoglobin: 13.6 g/dL (ref 12.0–15.0)
MCH: 27.5 pg (ref 26.0–34.0)
MCHC: 30.6 g/dL (ref 30.0–36.0)
MCV: 89.9 fL (ref 80.0–100.0)
Platelets: 191 K/uL (ref 150–400)
RBC: 4.95 MIL/uL (ref 3.87–5.11)
RDW: 13.2 % (ref 11.5–15.5)
WBC: 3.1 K/uL — ABNORMAL LOW (ref 4.0–10.5)
nRBC: 0 % (ref 0.0–0.2)

## 2024-05-03 NOTE — Telephone Encounter (Signed)
 Spoke to pt about lab work. Pt agreeable to labs on 06/05/24. Orders placed. No further questions at this time.

## 2024-05-16 ENCOUNTER — Encounter: Admitting: Cardiology

## 2024-05-22 ENCOUNTER — Telehealth: Payer: Self-pay | Admitting: Cardiology

## 2024-05-22 NOTE — Telephone Encounter (Signed)
 Called to confirm/remind patient of their appointment at the [facility] on 05/23/24.   Appointment:   [x] Confirmed  [] Left mess   [] No answer/No voice mail  [] VM Full/unable to leave message  [] Phone not in service  Patient reminded to bring all medications and/or complete list.  Confirmed patient has transportation. Gave directions, instructed to utilize valet parking.

## 2024-05-23 ENCOUNTER — Ambulatory Visit: Attending: Cardiology | Admitting: Cardiology

## 2024-05-23 VITALS — BP 140/82 | HR 75 | Wt 164.4 lb

## 2024-05-23 DIAGNOSIS — D869 Sarcoidosis, unspecified: Secondary | ICD-10-CM | POA: Diagnosis not present

## 2024-05-23 DIAGNOSIS — D8685 Sarcoid myocarditis: Secondary | ICD-10-CM | POA: Insufficient documentation

## 2024-05-23 DIAGNOSIS — I442 Atrioventricular block, complete: Secondary | ICD-10-CM | POA: Insufficient documentation

## 2024-05-23 DIAGNOSIS — Z95810 Presence of automatic (implantable) cardiac defibrillator: Secondary | ICD-10-CM | POA: Diagnosis not present

## 2024-05-23 DIAGNOSIS — E785 Hyperlipidemia, unspecified: Secondary | ICD-10-CM | POA: Diagnosis not present

## 2024-05-23 DIAGNOSIS — I700 Atherosclerosis of aorta: Secondary | ICD-10-CM | POA: Insufficient documentation

## 2024-05-24 NOTE — Progress Notes (Signed)
 PCP: Duanne Butler DASEN, MD EP: Dr. Fernande HF cardiology: Dr. Rolan  Chief complaint: Cardiac sarcoidosis  58 y.o. with history of complete heart block s/p St Jude BiV ICD placement and suspected cardiac sarcoidosis was referred by Dr. Fernande for management of cardiac sarcoidosis. Patient generally has been in good health, no major medical problems until 5/22.  In 5/22, she developed dizzy spells.  She would check her pulse and find it to be in the 30s.  She did not pass out.  She had an ECG at her PCP's office showing complete heart block and was admitted.  Echo showed normal LV systolic function.  Cardiac MRI was done due to concern for possible cardiac sarcoidosis as cause of CHB in a relatively young woman.  Cardiac MRI showed a mid-wall septal LGE pattern with elevated septal T2 signal that was concerning for cardiac sarcoidosis.  Patient had implantation of St Jude CRT-D device.  She then had a cardiac PET at [facility] which showed inflammation in the septum and the RV free wall along with a septal perfusion defect.  She was seen by Dr. Fernande and was started on prednisone  and methotrexate  for treatment of high probability cardiac sarcoidosis.   CT chest in 9/22 did not show signs of pulmonary sarcoidosis.   Repeat cardiac PET in 3/23 showed EF 59%, several areas of hypermetabolic activity suggestive of active inflammatory process in the LV, similar to prior cPET.   Echo in 8/23 showed EF 55%, basal septal hypokinesis, mildly decreased RV systolic function.  Cardiac PET in 4/24 showed EF 56%, no abnormal metabolism (no evidence for active inflammation).   In 11/24, patient developed fatigue, cough congestion.  Suspect viral illness, but CBC was done and new leukopenia and anemia were noted.  Given concern that this could be triggered by methotrexate , the dose of methotrexate  was decreased to 10 mg qwk. With ongoing leukopenia, methotrexate  was stopped.  She saw her rheumatologist and CBC was normal,  he restarted the methotrexate  at 10 mg qwk.   Cardiac PET in 5/25 showed EF 50%, no active inflammation.  Echo in 2/25 showed EF 50-55%, normal RV function.   Patient returns for followup of cardiac sarcoidosis.  Due to persistently low WBCs, methotrexate  was stopped.  She has been off it now for a couple of months.  No complaints today, no dyspnea or palpitations.  No chest pain.  Normal exercise tolerance.    St Jude device interrogation: >99% BiV pacing, stable impedance with no AF/VT.    Labs (8/23): ACE level 82 (upper normal), hgb 12.5, K 4.1, creatinine 0.92, LFTs normal Labs (1/24): LDL 135, HDL 55 Labs (11/24): K 4.1, creatinine 1.06, LFTs normal, hgb 11.2, WBC 2.6 Labs (1/25): hgb 12.4, WBCs 3.6 Labs (2/25): hgb 12.7, WBCs 2.8, LDL 144 Labs (3/25): hgb 13.2, WBCs 3.8 Labs (8/25): LDL 72 Labs (10/25): WBCs 3.1, K 4.1, creatinine 0.83  PMH: 1. H/o Shingles 2. Ovarian cyst 3. Complete heart block: Thought to be due to cardiac sarcoidosis, s/p placement of St Jude CRT-D device.  4. Cardiac sarcoidosis: Strong suspicion.  Patient presented in 5/22 with complete heart block.  - Echo (5/22): EF 60-65%, normal RV, PASP 36 mmHg.  - Cardiac MRI (5/22): LV EF 64%, increased T2 signal in septum, mid-wall septal LGE (non-MI pattern), RV EF 61%.  - Cardiac PET (7/22): LV EF 57%, inflammation in 57% of the septal wall and 17% of the total LV, inflammation also involved the RV free wall.  There  was a septal perfusion defect. This was concerning for possible cardiac sarcoidosis.  - High resolution CT chest (9/22): No signs of sarcoidosis, small RUL nodule.  - Cardiac PET (3/23): EF 59%, several areas of hypermetabolic activity suggestive of active inflammatory process in the LV, similar to prior cPET.  - Echo (8/23): EF 55%, basal septal hypokinesis, mildly decreased RV systolic function. - Cardiac PET (4/24): EF 56%, no abnormal metabolism (no evidence for active inflammation). - Echo (2/25):  EF 50-55%, normal RV function.  - Cardiac PET (5/25): EF 50%, no active inflammation.  - Leukopenia with methotrexate , this was stopped.  5. Lung nodule: following with serial CTs 6. Calcium  score scan (2/24): calcium  score 0   SH: Single, [place] from [place] originally, now lives in [place], nonsmoker, no ETOH/drugs, working full time.   FH: No family history of sarcoidosis or cardiomyopathy.   ROS: All systems reviewed and negative except as per HPI.   Current Outpatient Medications  Medication Sig Dispense Refill   BIOTIN  MAXIMUM PO Take 10,000 mcg by mouth daily.     carvedilol  (COREG ) 3.125 MG tablet TAKE ONE TABLET BY MOUTH TWICE A DAY 180 tablet 3   Cholecalciferol  (VITAMIN D3) 25 MCG (1000 UT) CAPS Take 1 capsule by mouth daily.     folic acid  (FOLVITE ) 1 MG tablet Take 2 mg by mouth daily.     furosemide  (LASIX ) 20 MG tablet TAKE ONE TABLET BY MOUTH ONCE A DAY 90 tablet 3   Multiple Vitamins-Minerals (VITAMIN D3 COMPLETE PO) Take 1 tablet by mouth daily.     potassium chloride  (KLOR-CON ) 10 MEQ tablet TAKE ONE TABLET BY MOUTH ONCE A DAY 90 tablet 3   rosuvastatin  (CRESTOR ) 10 MG tablet Take 1 tablet (10 mg total) by mouth daily. 90 tablet 3   venlafaxine  (EFFEXOR ) 37.5 MG tablet Take 37.5 mg by mouth daily.     vitamin B-12 (CYANOCOBALAMIN ) 500 MCG tablet Take 1 tablet (500 mcg total) by mouth every morning.     vitamin E 180 MG (400 UNITS) capsule Take 400 Units by mouth daily.     methotrexate  (RHEUMATREX) 2.5 MG tablet Take 10 mg by mouth once a week. (Patient not taking: Reported on 05/23/2024)     No current facility-administered medications for this visit.   BP (!) 140/82   Pulse 75   Wt 164 lb 6.4 oz (74.6 kg)   SpO2 100%   BMI 27.36 kg/m  General: NAD Neck: No JVD, no thyromegaly or thyroid  nodule.  Lungs: Clear to auscultation bilaterally with normal respiratory effort. CV: Nondisplaced PMI.  Heart regular S1/S2, no S3/S4, no murmur.  No peripheral  edema.  No carotid bruit.  Normal pedal pulses.  Abdomen: Soft, nontender, no hepatosplenomegaly, no distention.  Skin: Intact without lesions or rashes.  Neurologic: Alert and oriented x 3.  Psych: Normal affect. Extremities: No clubbing or cyanosis.  HEENT: Normal.   Assessment/Plan: 1. Complete heart block: In a young woman, there was concern for cardiac sarcoidosis as the cause.  Workup with cMRI and cPET was highly concerning for cardiac sarcoidosis.  She now has a [employer] CRT-D device.   - EP follows CRT-D device, functioning normally.  2. Cardiac sarcoidosis: Strong suspicion for cardiac sarcoidosis in a relatively young woman with complete heart block and both cMRI and cPET that are consistent with cardiac sarcoidosis.  She does not have evidence for extracardiac sarcoidosis; 25% of cardiac sarcoidosis, however, is isolated to the heart.  No skin  lesions and exam by eye doctor was unremarkable per her report.  No pulmonary symptoms, CT chest in 9/22 did not show signs of sarcoidosis.   We do not have a tissue diagnosis, but endomyocardial biopsy has a relatively low sensitivity in cardiac sarcoidosis.  Given high probability CS, we have treated her based on the data that we have. She is now off prednisone  and on goal dose MTX.  cPET in 3/23 showed EF 59%, several areas of hypermetabolic activity suggestive of active inflammatory process in the LV, similar to prior cPET. Echo in 8/23 showed EF 55%, basal septal hypokinesis (consistent with septal site for LGE on cMRI).  cPET in 4/24 showed significant improvement, EF 56%, no abnormal metabolism (no evidence for active inflammation).  cPET in 5/25 similarly showed EF 50% with no active inflammation and echo in 2/25 showed EF 50-55%.  Currently, no dyspnea, cough, or chest pain.  CBC in 11/24 showed leukopenia and anemia, methotrexate  was decreased to 10 mg qwk then stopped.  Her rheumatologist restarted it in 3/25 at 10 mg/wk in 3/25 when WBC count  returned to normal range. However, WBCs again declined and methotrexate  was stopped again.  She has now been off it a couple of months.  - Now that she has been off methotrexate , I would like to repeat a cardiac PET to see if there is any active inflammation (could then use mycophenolate or Humira).  - She is ordered for repeat CBC.  - Continue Coreg  3.125 mg bid.   - Continue Lasix  20 mg daily with KCl 10 daily.   3. Hyperlipidemia: With aortic atherosclerosis noted on imaging, she is now on Crestor .   Followup in 6 months, but would like to go ahead and get a cardiac PET to assess for inflammation suggesting active cardiac sarcoidosis.  She is concerned about insurance coverage (high deductible plan), so we will try to see how well the study will be covered.   I spent 31 minutes reviewing records, interviewing/examining patient, and managing orders.   Ezra Shuck 05/24/2024

## 2024-05-25 ENCOUNTER — Other Ambulatory Visit (HOSPITAL_COMMUNITY): Payer: Self-pay | Admitting: Cardiology

## 2024-05-31 ENCOUNTER — Ambulatory Visit: Payer: BC Managed Care – PPO

## 2024-05-31 DIAGNOSIS — I5022 Chronic systolic (congestive) heart failure: Secondary | ICD-10-CM

## 2024-06-01 LAB — CUP PACEART REMOTE DEVICE CHECK
Battery Remaining Longevity: 49 mo
Battery Remaining Percentage: 54 %
Battery Voltage: 2.96 V
Brady Statistic AP VP Percent: 22 %
Brady Statistic AP VS Percent: 1 %
Brady Statistic AS VP Percent: 77 %
Brady Statistic AS VS Percent: 1 %
Brady Statistic RA Percent Paced: 22 %
Date Time Interrogation Session: 20251119034105
HighPow Impedance: 69 Ohm
HighPow Impedance: 69 Ohm
Lead Channel Impedance Value: 410 Ohm
Lead Channel Impedance Value: 460 Ohm
Lead Channel Impedance Value: 510 Ohm
Lead Channel Pacing Threshold Amplitude: 0.25 V
Lead Channel Pacing Threshold Amplitude: 0.5 V
Lead Channel Pacing Threshold Amplitude: 1.5 V
Lead Channel Pacing Threshold Pulse Width: 0.5 ms
Lead Channel Pacing Threshold Pulse Width: 0.5 ms
Lead Channel Pacing Threshold Pulse Width: 0.5 ms
Lead Channel Sensing Intrinsic Amplitude: 12 mV
Lead Channel Sensing Intrinsic Amplitude: 3.6 mV
Lead Channel Setting Pacing Amplitude: 0.25 V
Lead Channel Setting Pacing Amplitude: 1.5 V
Lead Channel Setting Pacing Amplitude: 2.5 V
Lead Channel Setting Pacing Pulse Width: 0.05 ms
Lead Channel Setting Pacing Pulse Width: 0.5 ms
Lead Channel Setting Sensing Sensitivity: 0.5 mV
Pulse Gen Serial Number: 9956643
Zone Setting Status: 755011

## 2024-06-02 NOTE — Progress Notes (Signed)
 Remote ICD Transmission

## 2024-06-04 ENCOUNTER — Ambulatory Visit: Payer: Self-pay | Admitting: Cardiology

## 2024-06-12 ENCOUNTER — Other Ambulatory Visit
Admission: RE | Admit: 2024-06-12 | Discharge: 2024-06-12 | Disposition: A | Source: Ambulatory Visit | Attending: Cardiology | Admitting: Cardiology

## 2024-06-12 ENCOUNTER — Ambulatory Visit (HOSPITAL_COMMUNITY): Payer: Self-pay | Admitting: Cardiology

## 2024-06-12 DIAGNOSIS — D869 Sarcoidosis, unspecified: Secondary | ICD-10-CM | POA: Insufficient documentation

## 2024-06-12 LAB — CBC
HCT: 39.1 % (ref 36.0–46.0)
Hemoglobin: 12.4 g/dL (ref 12.0–15.0)
MCH: 27.1 pg (ref 26.0–34.0)
MCHC: 31.7 g/dL (ref 30.0–36.0)
MCV: 85.4 fL (ref 80.0–100.0)
Platelets: 215 K/uL (ref 150–400)
RBC: 4.58 MIL/uL (ref 3.87–5.11)
RDW: 12.9 % (ref 11.5–15.5)
WBC: 2.6 K/uL — ABNORMAL LOW (ref 4.0–10.5)
nRBC: 0 % (ref 0.0–0.2)

## 2024-06-20 ENCOUNTER — Encounter (HOSPITAL_COMMUNITY): Payer: Self-pay

## 2024-06-27 ENCOUNTER — Encounter: Payer: Self-pay | Admitting: Cardiology

## 2024-06-27 ENCOUNTER — Telehealth (HOSPITAL_COMMUNITY): Payer: Self-pay | Admitting: *Deleted

## 2024-06-27 NOTE — Telephone Encounter (Signed)
 Attempted to call patient regarding upcoming cardiac PET appointment. Left message on voicemail with name and callback number  Larey Brick RN Navigator Cardiac Imaging [facility] [phone] Office [phone] Cell

## 2024-06-27 NOTE — Telephone Encounter (Signed)
 Reaching out to patient to offer assistance regarding upcoming cardiac imaging study; pt verbalizes understanding of appt date/time, parking situation and where to check in, pre-test NPO status; name and call back number provided for further questions should they arise  Larey Brick RN Navigator Cardiac Imaging [facility] [phone] office [phone] cell  Patient verbalized understanding of diet prep.

## 2024-06-29 ENCOUNTER — Encounter (HOSPITAL_COMMUNITY): Admission: RE | Admit: 2024-06-29 | Source: Ambulatory Visit

## 2024-08-22 ENCOUNTER — Ambulatory Visit: Admitting: Cardiology

## 2024-09-07 ENCOUNTER — Ambulatory Visit: Admitting: Cardiology
